# Patient Record
Sex: Female | Born: 1965
Health system: Southern US, Community
[De-identification: ages and names within clinical notes are randomized; demographics above are authoritative.]

## PROBLEM LIST (undated history)

## (undated) DIAGNOSIS — Z9289 Personal history of other medical treatment: Secondary | ICD-10-CM

## (undated) DIAGNOSIS — M1990 Unspecified osteoarthritis, unspecified site: Secondary | ICD-10-CM

## (undated) DIAGNOSIS — I480 Paroxysmal atrial fibrillation: Secondary | ICD-10-CM

## (undated) DIAGNOSIS — I5189 Other ill-defined heart diseases: Secondary | ICD-10-CM

## (undated) DIAGNOSIS — F419 Anxiety disorder, unspecified: Secondary | ICD-10-CM

## (undated) DIAGNOSIS — D573 Sickle-cell trait: Secondary | ICD-10-CM

## (undated) DIAGNOSIS — K5792 Diverticulitis of intestine, part unspecified, without perforation or abscess without bleeding: Secondary | ICD-10-CM

## (undated) DIAGNOSIS — G4730 Sleep apnea, unspecified: Secondary | ICD-10-CM

## (undated) DIAGNOSIS — I10 Essential (primary) hypertension: Secondary | ICD-10-CM

## (undated) DIAGNOSIS — Z8489 Family history of other specified conditions: Secondary | ICD-10-CM

## (undated) DIAGNOSIS — D649 Anemia, unspecified: Secondary | ICD-10-CM

## (undated) HISTORY — DX: Essential (primary) hypertension: I10

## (undated) HISTORY — DX: Other ill-defined heart diseases: I51.89

## (undated) HISTORY — DX: Anxiety disorder, unspecified: F41.9

## (undated) HISTORY — PX: TUBAL LIGATION: SHX77

## (undated) HISTORY — DX: Paroxysmal atrial fibrillation: I48.0

## (undated) HISTORY — DX: Personal history of other medical treatment: Z92.89

## (undated) HISTORY — DX: Anemia, unspecified: D64.9

## (undated) HISTORY — PX: OTHER SURGICAL HISTORY: SHX169

---

## 1999-01-21 ENCOUNTER — Emergency Department (HOSPITAL_COMMUNITY): Admission: EM | Admit: 1999-01-21 | Discharge: 1999-01-21 | Payer: Self-pay | Admitting: Emergency Medicine

## 2000-01-25 ENCOUNTER — Encounter: Payer: Self-pay | Admitting: Emergency Medicine

## 2000-01-25 ENCOUNTER — Emergency Department (HOSPITAL_COMMUNITY): Admission: EM | Admit: 2000-01-25 | Discharge: 2000-01-25 | Payer: Self-pay | Admitting: Emergency Medicine

## 2003-10-09 ENCOUNTER — Encounter: Admission: RE | Admit: 2003-10-09 | Discharge: 2003-10-09 | Payer: Self-pay | Admitting: Family Medicine

## 2004-10-03 ENCOUNTER — Encounter: Admission: RE | Admit: 2004-10-03 | Discharge: 2004-10-03 | Payer: Self-pay | Admitting: Family Medicine

## 2005-01-16 ENCOUNTER — Emergency Department (HOSPITAL_COMMUNITY): Admission: EM | Admit: 2005-01-16 | Discharge: 2005-01-16 | Payer: Self-pay | Admitting: Family Medicine

## 2005-07-22 ENCOUNTER — Emergency Department (HOSPITAL_COMMUNITY): Admission: EM | Admit: 2005-07-22 | Discharge: 2005-07-22 | Payer: Self-pay | Admitting: Emergency Medicine

## 2006-07-31 ENCOUNTER — Ambulatory Visit: Payer: Self-pay | Admitting: Sports Medicine

## 2006-09-05 ENCOUNTER — Telehealth (INDEPENDENT_AMBULATORY_CARE_PROVIDER_SITE_OTHER): Payer: Self-pay | Admitting: *Deleted

## 2006-09-05 ENCOUNTER — Ambulatory Visit: Payer: Self-pay | Admitting: Family Medicine

## 2006-10-24 ENCOUNTER — Encounter (INDEPENDENT_AMBULATORY_CARE_PROVIDER_SITE_OTHER): Payer: Self-pay | Admitting: Family Medicine

## 2006-10-25 ENCOUNTER — Encounter (INDEPENDENT_AMBULATORY_CARE_PROVIDER_SITE_OTHER): Payer: Self-pay | Admitting: *Deleted

## 2006-10-25 ENCOUNTER — Ambulatory Visit: Payer: Self-pay | Admitting: Family Medicine

## 2006-10-25 ENCOUNTER — Telehealth (INDEPENDENT_AMBULATORY_CARE_PROVIDER_SITE_OTHER): Payer: Self-pay | Admitting: *Deleted

## 2006-10-26 ENCOUNTER — Telehealth (INDEPENDENT_AMBULATORY_CARE_PROVIDER_SITE_OTHER): Payer: Self-pay | Admitting: *Deleted

## 2006-10-29 ENCOUNTER — Emergency Department (HOSPITAL_COMMUNITY): Admission: EM | Admit: 2006-10-29 | Discharge: 2006-10-29 | Payer: Self-pay | Admitting: Family Medicine

## 2006-10-29 ENCOUNTER — Encounter: Payer: Self-pay | Admitting: *Deleted

## 2006-10-30 ENCOUNTER — Telehealth: Payer: Self-pay | Admitting: *Deleted

## 2006-10-31 ENCOUNTER — Ambulatory Visit: Payer: Self-pay | Admitting: Family Medicine

## 2006-11-13 ENCOUNTER — Encounter (INDEPENDENT_AMBULATORY_CARE_PROVIDER_SITE_OTHER): Payer: Self-pay | Admitting: *Deleted

## 2007-01-01 ENCOUNTER — Telehealth (INDEPENDENT_AMBULATORY_CARE_PROVIDER_SITE_OTHER): Payer: Self-pay | Admitting: *Deleted

## 2007-01-29 ENCOUNTER — Ambulatory Visit: Payer: Self-pay | Admitting: Family Medicine

## 2007-02-14 HISTORY — PX: CHOLECYSTECTOMY: SHX55

## 2007-03-25 ENCOUNTER — Ambulatory Visit: Payer: Self-pay | Admitting: Sports Medicine

## 2007-03-25 ENCOUNTER — Encounter: Payer: Self-pay | Admitting: *Deleted

## 2007-03-25 ENCOUNTER — Telehealth: Payer: Self-pay | Admitting: *Deleted

## 2007-06-07 ENCOUNTER — Ambulatory Visit: Payer: Self-pay | Admitting: Family Medicine

## 2007-06-07 ENCOUNTER — Encounter (INDEPENDENT_AMBULATORY_CARE_PROVIDER_SITE_OTHER): Payer: Self-pay | Admitting: Family Medicine

## 2007-06-13 ENCOUNTER — Telehealth (INDEPENDENT_AMBULATORY_CARE_PROVIDER_SITE_OTHER): Payer: Self-pay | Admitting: *Deleted

## 2007-06-13 ENCOUNTER — Ambulatory Visit: Payer: Self-pay | Admitting: Family Medicine

## 2007-06-19 ENCOUNTER — Telehealth: Payer: Self-pay | Admitting: *Deleted

## 2007-06-20 ENCOUNTER — Ambulatory Visit: Payer: Self-pay | Admitting: Family Medicine

## 2007-06-20 ENCOUNTER — Encounter (INDEPENDENT_AMBULATORY_CARE_PROVIDER_SITE_OTHER): Payer: Self-pay | Admitting: *Deleted

## 2007-06-20 LAB — CONVERTED CEMR LAB
ALT: 14 units/L (ref 0–35)
AST: 18 units/L (ref 0–37)
Albumin: 4.5 g/dL (ref 3.5–5.2)
BUN: 10 mg/dL (ref 6–23)
Calcium: 9.5 mg/dL (ref 8.4–10.5)
Potassium: 4 meq/L (ref 3.5–5.3)
Sodium: 139 meq/L (ref 135–145)
Total Protein: 7.9 g/dL (ref 6.0–8.3)

## 2007-06-22 ENCOUNTER — Encounter (INDEPENDENT_AMBULATORY_CARE_PROVIDER_SITE_OTHER): Payer: Self-pay | Admitting: *Deleted

## 2007-06-28 ENCOUNTER — Telehealth: Payer: Self-pay | Admitting: *Deleted

## 2007-07-18 ENCOUNTER — Ambulatory Visit: Payer: Self-pay | Admitting: Family Medicine

## 2007-07-19 ENCOUNTER — Emergency Department (HOSPITAL_COMMUNITY): Admission: EM | Admit: 2007-07-19 | Discharge: 2007-07-19 | Payer: Self-pay | Admitting: Emergency Medicine

## 2007-07-19 ENCOUNTER — Telehealth: Payer: Self-pay | Admitting: Family Medicine

## 2007-07-22 ENCOUNTER — Telehealth: Payer: Self-pay | Admitting: *Deleted

## 2007-07-22 ENCOUNTER — Ambulatory Visit: Payer: Self-pay | Admitting: Family Medicine

## 2007-08-01 ENCOUNTER — Encounter: Payer: Self-pay | Admitting: Family Medicine

## 2007-08-02 ENCOUNTER — Ambulatory Visit: Payer: Self-pay | Admitting: Family Medicine

## 2007-08-02 ENCOUNTER — Encounter (INDEPENDENT_AMBULATORY_CARE_PROVIDER_SITE_OTHER): Payer: Self-pay | Admitting: Family Medicine

## 2007-08-02 LAB — CONVERTED CEMR LAB
Bilirubin Urine: NEGATIVE
Blood Glucose, Fingerstick: 179
Calcium: 9.5 mg/dL (ref 8.4–10.5)
Chloride: 100 meq/L (ref 96–112)
Glucose, Urine, Semiquant: NEGATIVE
Hemoglobin: 10.2 g/dL — ABNORMAL LOW (ref 12.0–15.0)
Ketones, urine, test strip: NEGATIVE
MCHC: 31.6 g/dL (ref 30.0–36.0)
Platelets: 528 10*3/uL — ABNORMAL HIGH (ref 150–400)
Potassium: 3.4 meq/L — ABNORMAL LOW (ref 3.5–5.3)
Protein, U semiquant: NEGATIVE
RDW: 17.3 % — ABNORMAL HIGH (ref 11.5–15.5)
Specific Gravity, Urine: 1.02
Urobilinogen, UA: 0.2
WBC Urine, dipstick: NEGATIVE

## 2007-08-05 ENCOUNTER — Telehealth: Payer: Self-pay | Admitting: *Deleted

## 2007-08-05 ENCOUNTER — Ambulatory Visit: Payer: Self-pay | Admitting: Family Medicine

## 2007-08-05 ENCOUNTER — Encounter (INDEPENDENT_AMBULATORY_CARE_PROVIDER_SITE_OTHER): Payer: Self-pay | Admitting: *Deleted

## 2007-08-05 LAB — CONVERTED CEMR LAB
Retic Ct Pct: 1.9 % (ref 0.4–3.1)
UIBC: 424 ug/dL

## 2007-08-12 ENCOUNTER — Encounter (INDEPENDENT_AMBULATORY_CARE_PROVIDER_SITE_OTHER): Payer: Self-pay | Admitting: Family Medicine

## 2007-08-12 ENCOUNTER — Ambulatory Visit: Payer: Self-pay | Admitting: Family Medicine

## 2007-08-22 ENCOUNTER — Encounter: Payer: Self-pay | Admitting: Family Medicine

## 2007-08-26 ENCOUNTER — Encounter: Payer: Self-pay | Admitting: Family Medicine

## 2007-08-26 ENCOUNTER — Ambulatory Visit: Payer: Self-pay | Admitting: Sports Medicine

## 2007-08-26 LAB — CONVERTED CEMR LAB
HCT: 33.8 % — ABNORMAL LOW (ref 36.0–46.0)
MCV: 74.6 fL — ABNORMAL LOW (ref 78.0–100.0)
WBC: 8.3 10*3/uL (ref 4.0–10.5)

## 2007-09-03 ENCOUNTER — Ambulatory Visit: Payer: Self-pay | Admitting: Family Medicine

## 2007-09-03 ENCOUNTER — Encounter (INDEPENDENT_AMBULATORY_CARE_PROVIDER_SITE_OTHER): Payer: Self-pay | Admitting: Family Medicine

## 2007-09-03 ENCOUNTER — Telehealth: Payer: Self-pay | Admitting: *Deleted

## 2007-09-11 ENCOUNTER — Telehealth: Payer: Self-pay | Admitting: *Deleted

## 2007-09-16 ENCOUNTER — Telehealth: Payer: Self-pay | Admitting: *Deleted

## 2007-09-17 ENCOUNTER — Encounter: Payer: Self-pay | Admitting: *Deleted

## 2007-09-23 ENCOUNTER — Telehealth: Payer: Self-pay | Admitting: Family Medicine

## 2007-09-25 ENCOUNTER — Encounter: Payer: Self-pay | Admitting: Family Medicine

## 2007-09-27 ENCOUNTER — Encounter: Payer: Self-pay | Admitting: Family Medicine

## 2007-09-30 ENCOUNTER — Telehealth: Payer: Self-pay | Admitting: Family Medicine

## 2007-10-02 ENCOUNTER — Ambulatory Visit: Payer: Self-pay | Admitting: Family Medicine

## 2007-10-14 ENCOUNTER — Encounter: Payer: Self-pay | Admitting: Family Medicine

## 2007-10-17 ENCOUNTER — Ambulatory Visit: Payer: Self-pay | Admitting: Family Medicine

## 2007-10-22 ENCOUNTER — Telehealth: Payer: Self-pay | Admitting: *Deleted

## 2007-10-31 ENCOUNTER — Telehealth: Payer: Self-pay | Admitting: *Deleted

## 2007-10-31 ENCOUNTER — Encounter: Payer: Self-pay | Admitting: Family Medicine

## 2007-11-07 ENCOUNTER — Encounter: Payer: Self-pay | Admitting: Family Medicine

## 2007-11-12 ENCOUNTER — Ambulatory Visit: Payer: Self-pay | Admitting: Family Medicine

## 2007-11-12 ENCOUNTER — Telehealth: Payer: Self-pay | Admitting: *Deleted

## 2007-11-13 ENCOUNTER — Encounter: Payer: Self-pay | Admitting: Family Medicine

## 2007-12-02 ENCOUNTER — Encounter (INDEPENDENT_AMBULATORY_CARE_PROVIDER_SITE_OTHER): Payer: Self-pay | Admitting: *Deleted

## 2007-12-03 ENCOUNTER — Telehealth (INDEPENDENT_AMBULATORY_CARE_PROVIDER_SITE_OTHER): Payer: Self-pay | Admitting: *Deleted

## 2007-12-03 ENCOUNTER — Ambulatory Visit: Payer: Self-pay | Admitting: Family Medicine

## 2007-12-06 ENCOUNTER — Encounter (INDEPENDENT_AMBULATORY_CARE_PROVIDER_SITE_OTHER): Payer: Self-pay | Admitting: *Deleted

## 2007-12-06 ENCOUNTER — Telehealth (INDEPENDENT_AMBULATORY_CARE_PROVIDER_SITE_OTHER): Payer: Self-pay | Admitting: *Deleted

## 2007-12-13 ENCOUNTER — Ambulatory Visit: Payer: Self-pay | Admitting: Family Medicine

## 2007-12-17 ENCOUNTER — Telehealth (INDEPENDENT_AMBULATORY_CARE_PROVIDER_SITE_OTHER): Payer: Self-pay | Admitting: *Deleted

## 2007-12-19 ENCOUNTER — Telehealth: Payer: Self-pay | Admitting: Family Medicine

## 2007-12-19 ENCOUNTER — Encounter: Payer: Self-pay | Admitting: Family Medicine

## 2007-12-23 ENCOUNTER — Encounter: Payer: Self-pay | Admitting: Family Medicine

## 2007-12-25 ENCOUNTER — Telehealth: Payer: Self-pay | Admitting: Family Medicine

## 2007-12-25 ENCOUNTER — Emergency Department (HOSPITAL_COMMUNITY): Admission: EM | Admit: 2007-12-25 | Discharge: 2007-12-25 | Payer: Self-pay | Admitting: Emergency Medicine

## 2007-12-25 ENCOUNTER — Encounter: Payer: Self-pay | Admitting: Family Medicine

## 2007-12-25 ENCOUNTER — Ambulatory Visit (HOSPITAL_COMMUNITY): Admission: RE | Admit: 2007-12-25 | Discharge: 2007-12-25 | Payer: Self-pay | Admitting: Gastroenterology

## 2007-12-27 ENCOUNTER — Telehealth: Payer: Self-pay | Admitting: Family Medicine

## 2007-12-30 ENCOUNTER — Encounter: Payer: Self-pay | Admitting: Family Medicine

## 2008-01-02 ENCOUNTER — Telehealth (INDEPENDENT_AMBULATORY_CARE_PROVIDER_SITE_OTHER): Payer: Self-pay | Admitting: *Deleted

## 2008-01-02 ENCOUNTER — Emergency Department (HOSPITAL_COMMUNITY): Admission: EM | Admit: 2008-01-02 | Discharge: 2008-01-02 | Payer: Self-pay | Admitting: Emergency Medicine

## 2008-01-03 ENCOUNTER — Encounter: Payer: Self-pay | Admitting: Family Medicine

## 2008-01-10 ENCOUNTER — Encounter (INDEPENDENT_AMBULATORY_CARE_PROVIDER_SITE_OTHER): Payer: Self-pay | Admitting: Surgery

## 2008-01-10 ENCOUNTER — Ambulatory Visit (HOSPITAL_COMMUNITY): Admission: RE | Admit: 2008-01-10 | Discharge: 2008-01-12 | Payer: Self-pay | Admitting: Surgery

## 2008-01-13 ENCOUNTER — Telehealth (INDEPENDENT_AMBULATORY_CARE_PROVIDER_SITE_OTHER): Payer: Self-pay | Admitting: *Deleted

## 2008-01-14 ENCOUNTER — Encounter: Payer: Self-pay | Admitting: Family Medicine

## 2008-01-16 ENCOUNTER — Encounter: Payer: Self-pay | Admitting: Family Medicine

## 2008-01-16 LAB — CONVERTED CEMR LAB
Alkaline Phosphatase: 58 units/L
BUN: 8 mg/dL
Creatinine, Ser: 0.83 mg/dL
Hemoglobin: 10.2 g/dL
Potassium: 4.2 meq/L
Sodium: 135 meq/L
Total Bilirubin: 0.4 mg/dL

## 2008-01-17 ENCOUNTER — Encounter: Payer: Self-pay | Admitting: Family Medicine

## 2008-01-23 ENCOUNTER — Telehealth: Payer: Self-pay | Admitting: *Deleted

## 2008-01-24 ENCOUNTER — Encounter: Payer: Self-pay | Admitting: *Deleted

## 2008-01-30 ENCOUNTER — Telehealth: Payer: Self-pay | Admitting: *Deleted

## 2008-03-11 ENCOUNTER — Encounter: Payer: Self-pay | Admitting: Family Medicine

## 2008-04-01 ENCOUNTER — Ambulatory Visit: Payer: Self-pay | Admitting: Family Medicine

## 2008-04-01 ENCOUNTER — Encounter: Payer: Self-pay | Admitting: Family Medicine

## 2008-09-09 ENCOUNTER — Inpatient Hospital Stay (HOSPITAL_COMMUNITY): Admission: AD | Admit: 2008-09-09 | Discharge: 2008-09-09 | Payer: Self-pay | Admitting: Obstetrics & Gynecology

## 2008-09-11 ENCOUNTER — Inpatient Hospital Stay (HOSPITAL_COMMUNITY): Admission: EM | Admit: 2008-09-11 | Discharge: 2008-09-12 | Payer: Self-pay | Admitting: Emergency Medicine

## 2008-10-24 ENCOUNTER — Encounter (INDEPENDENT_AMBULATORY_CARE_PROVIDER_SITE_OTHER): Payer: Self-pay | Admitting: *Deleted

## 2009-05-03 ENCOUNTER — Encounter: Payer: Self-pay | Admitting: Family Medicine

## 2009-05-03 ENCOUNTER — Telehealth: Payer: Self-pay | Admitting: Family Medicine

## 2009-05-27 ENCOUNTER — Ambulatory Visit: Payer: Self-pay | Admitting: Family Medicine

## 2009-06-07 ENCOUNTER — Encounter: Payer: Self-pay | Admitting: Family Medicine

## 2009-06-30 ENCOUNTER — Telehealth: Payer: Self-pay | Admitting: Family Medicine

## 2009-08-10 ENCOUNTER — Encounter: Payer: Self-pay | Admitting: Family Medicine

## 2009-08-18 ENCOUNTER — Encounter: Payer: Self-pay | Admitting: Family Medicine

## 2009-08-18 ENCOUNTER — Telehealth: Payer: Self-pay | Admitting: Family Medicine

## 2009-08-18 ENCOUNTER — Ambulatory Visit: Payer: Self-pay | Admitting: Family Medicine

## 2009-08-18 LAB — CONVERTED CEMR LAB
ALT: 11 units/L (ref 0–35)
Albumin: 3.8 g/dL (ref 3.5–5.2)
CO2: 18 meq/L — ABNORMAL LOW (ref 19–32)
Chloride: 108 meq/L (ref 96–112)
Cholesterol: 198 mg/dL (ref 0–200)
Creatinine, Ser: 0.78 mg/dL (ref 0.40–1.20)
Glucose, Bld: 92 mg/dL (ref 70–99)
HDL: 51 mg/dL (ref >39)
Hemoglobin: 9.9 g/dL — ABNORMAL LOW (ref 12.0–15.0)
LDL Cholesterol: 127 mg/dL — ABNORMAL HIGH (ref 0–99)
MCV: 73.2 fL — ABNORMAL LOW (ref 78.0–100.0)
Potassium: 4.1 meq/L (ref 3.5–5.3)
RDW: 19.3 % — ABNORMAL HIGH (ref 11.5–15.5)
Vit D, 25-Hydroxy: <10 ng/mL — ABNORMAL LOW (ref 30–89)

## 2009-08-19 ENCOUNTER — Telehealth: Payer: Self-pay | Admitting: Family Medicine

## 2009-12-17 ENCOUNTER — Emergency Department (HOSPITAL_COMMUNITY)
Admission: EM | Admit: 2009-12-17 | Discharge: 2009-12-17 | Payer: Self-pay | Source: Home / Self Care | Admitting: Family Medicine

## 2009-12-22 ENCOUNTER — Ambulatory Visit: Payer: Self-pay | Admitting: Family Medicine

## 2010-01-05 ENCOUNTER — Ambulatory Visit: Payer: Self-pay | Admitting: Family Medicine

## 2010-01-12 ENCOUNTER — Telehealth (INDEPENDENT_AMBULATORY_CARE_PROVIDER_SITE_OTHER): Payer: Self-pay | Admitting: Family Medicine

## 2010-01-25 ENCOUNTER — Encounter: Payer: Self-pay | Admitting: Cardiology

## 2010-01-25 ENCOUNTER — Ambulatory Visit: Payer: Self-pay | Admitting: Cardiology

## 2010-02-03 ENCOUNTER — Ambulatory Visit: Payer: Self-pay

## 2010-02-03 ENCOUNTER — Encounter: Payer: Self-pay | Admitting: Cardiology

## 2010-02-03 ENCOUNTER — Ambulatory Visit (HOSPITAL_COMMUNITY)
Admission: RE | Admit: 2010-02-03 | Discharge: 2010-02-03 | Payer: Self-pay | Source: Home / Self Care | Attending: Cardiology | Admitting: Cardiology

## 2010-02-18 ENCOUNTER — Ambulatory Visit: Admit: 2010-02-18 | Payer: Self-pay

## 2010-02-22 ENCOUNTER — Ambulatory Visit: Admit: 2010-02-22 | Payer: Self-pay

## 2010-03-07 ENCOUNTER — Ambulatory Visit
Admission: RE | Admit: 2010-03-07 | Discharge: 2010-03-07 | Payer: Self-pay | Source: Home / Self Care | Attending: Family Medicine | Admitting: Family Medicine

## 2010-03-14 ENCOUNTER — Telehealth: Payer: Self-pay | Admitting: Family Medicine

## 2010-03-17 NOTE — Progress Notes (Signed)
Summary: FMLA forms  Phone Note Call from Patient Call back at [phone]   Summary of Call: Needs to speak with someone about FMLA forms. Initial call taken by: Haydee Salter,  September 23, 2007 10:42 AM  Follow-up for Phone Call        spoke with patient earlier today and she is going to bring in more papers regarding FMLA that her employer states are incorrect. Follow-up by: Theresia Lo RN,  September 23, 2007 5:02 PM  Additional Follow-up for Phone Call Additional follow up Details #1::        pt is returning call to [facility] Additional Follow-up by: Knox Royalty,  September 24, 2007 4:23 PM    Additional Follow-up for Phone Call Additional follow up Details #2::    patient comes in stating her employer is requiring a plan of treatment for 06/27 thru 6/30 and plan of treatment for 08/02/07 thru 08/06/07. they are dening it otherwise. also she needs FMLA filled out for 07/11/ thru 7/18. patient saw Dr. Gavin Potters and the [facility] for these dates were never turned in. will place forms in MD box.  Will ask Dr.  Lafonda Mosses to  please give forms to Amy  today if you are able to fill out. Follow-up by: Theresia Lo RN,  September 25, 2007 9:24 AM  Additional Follow-up for Phone Call Additional follow up Details #3:: Details for Additional Follow-up Action Taken: Filled out new form for 7/11-18.  wrote treatment plan on old forms (last page).  Placed the forms back in my box.  Let me know what else I need to do.  Many thanks.   Additional Follow-up by: Asher Muir MD,  September 25, 2007 5:53 PM      Appended Document: FMLA forms forms faxed

## 2010-03-17 NOTE — Assessment & Plan Note (Signed)
Summary: ZO:XWRUEA megaly/lg   Primary Provider:  Helane Rima DO  CC:  referal from Dr. Earlene Plater for cardiomegaly.  History of Present Illness: 45 year old female for evaluation of cardiomegaly. This was noted on recent chest x-ray. Patient does have some dyspnea on exertion but she attributes this to tobacco use. She denies orthopnea, PND, pedal edema, chest pain, palpitations or syncope. Because of her cardiomegaly we are asked to further evaluate.  Current Medications (verified): 1)  Hydrochlorothiazide 25 Mg  Tabs (Hydrochlorothiazide) .... Take 1 Tab By Mouth Every Morning 2)  Claritin 10 Mg Tabs (Loratadine) .Marland Kitchen.. 1 Tab By Mouth Q Am 3)  Singulair 10 Mg Tabs (Montelukast Sodium) .Marland Kitchen.. 1 Tab  By Mouth Daily 4)  Xyzal 5 Mg Tabs (Levocetirizine Dihydrochloride) .Marland Kitchen.. 1 Tab By Mouth Q Pm 5)  Ventolin Hfa 108 (90 Base) Mcg/act Aers (Albuterol Sulfate) .... 2 Puffs Q 4 Hour As Needed Wheezing 6)  Flonase 50 Mcg/act Susp (Fluticasone Propionate) .... 2 Sprays in Each Nostril Daily 7)  Citalopram Hydrobromide 40 Mg Tabs (Citalopram Hydrobromide) .Marland Kitchen.. 1 Tab By Mouth Daily For Depression 8)  Gabapentin 100 Mg Caps (Gabapentin) .Marland Kitchen.. 1 Tab By Mouth Three Times A Day For Pain 9)  Cholestyramine 4 Gm Pack (Cholestyramine) .... Take 2-4 Grams Once Daily 10)  Ibuprofen 800 Mg Tabs (Ibuprofen) .... One Tablet Every 8 Hours For Pain 11)  Trazodone Hcl 50 Mg Tabs (Trazodone Hcl) .Marland Kitchen.. 1 By Mouth At Bedtime As Needed Insomnia  Allergies: No Known Drug Allergies  Past History:  Past Medical History: HYPERTENSION, BENIGN  SICKLE CELL TRAIT  BACK PAIN, LUMBAR, WITH RADICULOPATHY ANEMIA DEPRESSION, MAJOR GERD  ALLERGIC RHINITIS INSOMNIA  OBESITY NOS  Past Surgical History: lap cholecystectomy Incision drainage and open packing of perirectal abscess C-section x 3  Family History: Reviewed history from 07/31/2006 and no changes required. HTN Mother with enlarged heart  Social  History: Reviewed history from 04/01/2008 and no changes required. 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   Review of Systems       no fevers or chills, productive cough, hemoptysis, dysphasia, odynophagia, melena, hematochezia, dysuria, hematuria, rash, seizure activity, orthopnea, PND, pedal edema, claudication. Remaining systems are negative.   Vital Signs:  Patient profile:   45 year old female Height:      63 inches Weight:      230 pounds BMI:     40.89 Pulse rate:   96 / minute Resp:     14 per minute BP sitting:   130 / 90  (left arm)  Vitals Entered By: Kem Parkinson (January 25, 2010 2:34 PM)  Physical Exam  General:  Well-developed obese in no acute distress.  Skin is warm and dry.  HEENT is normal.  Neck is supple. No thyromegaly.  Chest is clear to auscultation with normal expansion.  Cardiovascular exam is regular rate and rhythm.  Abdominal exam nontender or distended. No masses palpated. Extremities show no edema. neuro grossly intact    EKG  Procedure date:  01/25/2010  Findings:      Sinus rhythm, occasional PAC, nonspecific ST changes, low voltage.  Impression & Recommendations:  Problem # 1:  CARDIOMEGALY (ICD-429.3) Patient has mild dyspnea on exertion but no other cardiac symptoms. Scheduled echocardiogram. LV size and function normal no further evaluation warranted. Orders: Echocardiogram (Echo)  Problem # 2:  HYPERTENSION, BENIGN (ICD-401.1) Blood pressure  mildly elevated. She will follow this at home and additional medications can be added as needed. I will leave this to primary care. Her updated medication list for this problem includes:    Hydrochlorothiazide 25 Mg Tabs (Hydrochlorothiazide) .Marland Kitchen... Take 1 tab by mouth every morning  Problem # 3:  TOBACCO USER (ICD-305.1) Patient counseled on  discontinuing for between 3-10 minutes.  Problem # 4:  SICKLE CELL TRAIT (ICD-282.5)  Patient Instructions: 1)  Your physician has requested that you have an echocardiogram.  Echocardiography is a painless test that uses sound waves to create images of your heart. It provides your doctor with information about the size and shape of your heart and how well your heart's chambers and valves are working.  This procedure takes approximately one hour. There are no restrictions for this procedure.   Vital Signs:  Patient profile:   45 year old female Height:      63 inches Weight:      230 pounds BMI:     40.89 Pulse rate:   96 / minute Resp:     14 per minute BP sitting:   130 / 90  (left arm)  Vitals Entered By: Kem Parkinson (January 25, 2010 2:34 PM)

## 2010-03-17 NOTE — Assessment & Plan Note (Signed)
Summary: rash/eo   Vital Signs:  Patient profile:   45 year old female Weight:      230 pounds Temp:     98.7 degrees F oral Pulse rate:   90 / minute BP sitting:   132 / 82  (right arm)  Vitals Entered By: Arlyss Repress CMA, (March 07, 2010 11:24 AM) CC: rash around vagina and groin area x 3 weeks. very itchy. Is Patient Diabetic? No Pain Assessment Patient in pain? no        Primary Care Provider:  Helane Rima DO  CC:  rash around vagina and groin area x 3 weeks. very itchy..  History of Present Illness: 45 yo F:  1. Rash: Under pannus and inner thighs, x several days, worsening, raw, itchy. No vaginal DC or itch. No open lesions. Has never had this before. No Hx DM.  Habits & Providers  Alcohol-Tobacco-Diet     Tobacco Status: never  Current Medications (verified): 1)  Hydrochlorothiazide 25 Mg  Tabs (Hydrochlorothiazide) .... Take 1 Tab By Mouth Every Morning 2)  Claritin 10 Mg Tabs (Loratadine) .Marland Kitchen.. 1 Tab By Mouth Q Am 3)  Singulair 10 Mg Tabs (Montelukast Sodium) .Marland Kitchen.. 1 Tab  By Mouth Daily 4)  Xyzal 5 Mg Tabs (Levocetirizine Dihydrochloride) .Marland Kitchen.. 1 Tab By Mouth Q Pm 5)  Ventolin Hfa 108 (90 Base) Mcg/act Aers (Albuterol Sulfate) .... 2 Puffs Q 4 Hour As Needed Wheezing 6)  Flonase 50 Mcg/act Susp (Fluticasone Propionate) .... 2 Sprays in Each Nostril Daily 7)  Citalopram Hydrobromide 40 Mg Tabs (Citalopram Hydrobromide) .Marland Kitchen.. 1 Tab By Mouth Daily For Depression 8)  Gabapentin 100 Mg Caps (Gabapentin) .Marland Kitchen.. 1 Tab By Mouth Three Times A Day For Pain 9)  Cholestyramine 4 Gm Pack (Cholestyramine) .... Take 2-4 Grams Once Daily 10)  Ibuprofen 800 Mg Tabs (Ibuprofen) .... One Tablet Every 8 Hours For Pain 11)  Trazodone Hcl 50 Mg Tabs (Trazodone Hcl) .Marland Kitchen.. 1 By Mouth At Bedtime As Needed Insomnia 12)  Diflucan 150 Mg Tabs (Fluconazole) .... Once Daily X 1, Then Again in 1 Week If Not Better 13)  Nystatin 100000 Unit/gm Crea (Nystatin) .... Apply To Intertigo Two Times  A Day As Needed For Rash  Allergies (verified): No Known Drug Allergies PMH-FH-SH reviewed for relevance  Social History: Smoking Status:  never  Review of Systems General:  Denies chills and fever. GI:  Denies abdominal pain, constipation, diarrhea, nausea, and vomiting. GU:  Denies discharge, dysuria, and genital sores. Derm:  Complains of rash.  Physical Exam  General:  Well-developed, well-nourished, in no acute distress; alert, appropriate and cooperative throughout examination. Vitals reviewed. Skin:  Intertrigo.   Impression & Recommendations:  Problem # 1:  INTERTRIGO, CANDIDAL (ZOX-096.04) Assessment New  Rx Diflucan, Nystatin. Discussed Dx, Tx, and future prevention.  Orders: [facility]- Est Level  3 (54098)  Complete Medication List: 1)  Hydrochlorothiazide 25 Mg Tabs (Hydrochlorothiazide) .... Take 1 tab by mouth every morning 2)  Claritin 10 Mg Tabs (Loratadine) .Marland Kitchen.. 1 tab by mouth q am 3)  Singulair 10 Mg Tabs (Montelukast sodium) .Marland Kitchen.. 1 tab  by mouth daily 4)  Xyzal 5 Mg Tabs (Levocetirizine dihydrochloride) .Marland Kitchen.. 1 tab by mouth q pm 5)  Ventolin Hfa 108 (90 Base) Mcg/act Aers (Albuterol sulfate) .... 2 puffs q 4 hour as needed wheezing 6)  Flonase 50 Mcg/act Susp (Fluticasone propionate) .... 2 sprays in each nostril daily 7)  Citalopram Hydrobromide 40 Mg Tabs (Citalopram hydrobromide) .Marland Kitchen.. 1 tab by  mouth daily for depression 8)  Gabapentin 100 Mg Caps (Gabapentin) .Marland Kitchen.. 1 tab by mouth three times a day for pain 9)  Cholestyramine 4 Gm Pack (Cholestyramine) .... Take 2-4 grams once daily 10)  Ibuprofen 800 Mg Tabs (Ibuprofen) .... One tablet every 8 hours for pain 11)  Trazodone Hcl 50 Mg Tabs (Trazodone hcl) .Marland Kitchen.. 1 by mouth at bedtime as needed insomnia 12)  Diflucan 150 Mg Tabs (Fluconazole) .... Once daily x 1, then again in 1 week if not better 13)  Nystatin 100000 Unit/gm Crea (Nystatin) .... Apply to intertigo two times a day as needed for rash  Patient  Instructions: 1)  It was nice to see you today! 2)  You have a yeast infection. Prescriptions: NYSTATIN 100000 UNIT/GM CREA (NYSTATIN) apply to intertigo two times a day as needed for rash  #15 x 0   Entered and Authorized by:   Helane Rima DO   Signed by:   Helane Rima DO on 03/07/2010   Method used:   Print then Give to Patient   RxID:   [id number] DIFLUCAN 150 MG TABS (FLUCONAZOLE) once daily x 1, then again in 1 week if not better  #2 x 0   Entered and Authorized by:   Helane Rima DO   Signed by:   Helane Rima DO on 03/07/2010   Method used:   Print then Give to Patient   RxID:   [id number]    Orders Added: 1)  [facility]- Est Level  3 [[address]]

## 2010-03-17 NOTE — Letter (Signed)
Summary: Generic Letter  [facility]  [address]   [place], [place] [address]   Phone: [phone]  Fax: [phone]    06/07/2009  TAWYNA PELLOT [address] [place], [place]  [address]  Dear [name],   You missed your lab appointment last week.  It is very important that we check your labs at least once a year.  Please reschedule your appointment.   Please call me if you have any questions or concerns.           Sincerely,   Asher Muir MD

## 2010-03-17 NOTE — Progress Notes (Signed)
Summary: TRIAGE  Phone Note Refill Request Call back at Home Phone [phone] Message from:  Patient  Refills Requested: Medication #1:  HYDROCHLOROTHIAZIDE 25 MG  TABS Take 1 tab by mouth every morning PT IS AT [facility] WAITING FOR HER MEDS AND DR BRISCOE WAS TO SEND THIS ONE IN ALSO.  CAN THIS BE DONE RIGHT AWAY.  Initial call taken by: Clydell Hakim,  August 18, 2009 1:34 PM Caller: Patient  Follow-up for Phone Call        sent text to md Follow-up by: Golden Circle RN,  August 18, 2009 1:53 PM  Additional Follow-up for Phone Call Additional follow up Details #1::        md states she is waiting for labs before she refills it. called to to let her know this.  had to LM asking her to call me Additional Follow-up by: Golden Circle RN,  August 18, 2009 2:56 PM    Additional Follow-up for Phone Call Additional follow up Details #2::    Left message wanting to speak to patient about her labwork- I would like to speak with her. Prescriptions have been called in. Follow-up by: Delbert Harness MD,  August 19, 2009 8:35 AM  Additional Follow-up for Phone Call Additional follow up Details #3:: Details for Additional Follow-up Action Taken: faxed rx for ambien to [facility] [address] Additional Follow-up by: Golden Circle RN,  August 19, 2009 9:57 AM

## 2010-03-17 NOTE — Progress Notes (Signed)
Summary: Rx Req  Phone Note Refill Request Call back at Home Phone [phone] Message from:  Patient  Refills Requested: Medication #1:  HYDROCHLOROTHIAZIDE 25 MG  TABS Take 1 tab by mouth every morning PT USES [facility] [address]  Initial call taken by: Clydell Hakim,  May 03, 2009 2:20 PM  Follow-up for Phone Call        I refilled for one month.  She has not been seen in hte office for over one year.  She will need to come in for further refills Follow-up by: Asher Muir MD,  May 03, 2009 10:20 PM  Additional Follow-up for Phone Call Additional follow up Details #1::        Pt notified concerning rx and to make appt.  Pt made appt. for 05/24/09. Additional Follow-up by: Clydell Hakim,  May 04, 2009 4:36 PM    Prescriptions: HYDROCHLOROTHIAZIDE 25 MG  TABS (HYDROCHLOROTHIAZIDE) Take 1 tab by mouth every morning  #31 Tablet x 0   Entered and Authorized by:   Asher Muir MD   Signed by:   Asher Muir MD on 05/03/2009   Method used:   Electronically to        [facility] [phone]* (retail)       [address]       [place], [place]  [address]       Ph: [phone]       Fax: [phone]   RxID:   [id number]

## 2010-03-17 NOTE — Progress Notes (Signed)
Summary: re: appt with Cardiology/TS  Phone Note Outgoing Call   Call placed by: Jimmy Footman, CMA,  January 12, 2010 4:58 PM Call placed to: Patient Summary of Call: LVM for pt. to call back. Cardiology referral info is on order  Follow-up for Phone Call        called pt and lmvm again to call us back. re: appt with Cardiologist. Follow-up by: Arlyss Repress CMA,,  January 13, 2010 11:42 AM  Additional Follow-up for Phone Call Additional follow up Details #1::        pt informed Additional Follow-up by: De Nurse,  January 13, 2010 3:07 PM
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
## 2010-03-17 NOTE — Miscellaneous (Signed)
Summary: do not refill meds until she makes lab appt  Clinical Lists Changes  pt needs labs prior to further refills of her hctz

## 2010-03-17 NOTE — Letter (Signed)
Summary: Generic Letter  [facility]  [address]   [place], [place] [address]   Phone: [phone]  Fax: [phone]    05/03/2009  SAMHITHA ROSEN [address] [place], [place]  [address]  Dear [name],   You have not been seen in our office in some time.  Please call [phone] and schedule an office visit.  I look forward to seeing you.         Sincerely,   Asher Muir MD  Appended Document: Generic Letter pt has appt on 4/11- did not mail

## 2010-03-17 NOTE — Progress Notes (Signed)
Summary: refill  Phone Note Refill Request Call back at Home Phone [phone] Message from:  Patient  Refills Requested: Medication #1:  HYDROCHLOROTHIAZIDE 25 MG  TABS Take 1 tab by mouth every morning pt is now out  Initial call taken by: De Nurse,  Jun 30, 2009 12:30 PM Caller: Patient  Follow-up for Phone Call        she needs to come in for labs.  I will refill one more month, but she must get her labs drawn for any further refills.  Also, will you see which pharmacy she would like it sent to?  thanks Follow-up by: Asher Muir MD,  Jun 30, 2009 2:21 PM  Additional Follow-up for Phone Call Additional follow up Details #1::        pt notified, voiced understanding, [facility] [address],told to check with pharm later this afternoon , to PCP Additional Follow-up by: Gladstone Pih,  Jun 30, 2009 2:28 PM    Prescriptions: HYDROCHLOROTHIAZIDE 25 MG  TABS (HYDROCHLOROTHIAZIDE) Take 1 tab by mouth every morning  #31 Tablet x 0   Entered and Authorized by:   Asher Muir MD   Signed by:   Asher Muir MD on 06/30/2009   Method used:   Electronically to        [facility] [phone]* (retail)       [address]       [place], [place]  [address]       Ph: [phone]       Fax: [phone]   RxID:   [id number]

## 2010-03-17 NOTE — Assessment & Plan Note (Signed)
Summary: rectal pain/labs,df   Vital Signs:  Patient profile:   45 year old female Weight:      232.3 pounds BMI:     41.30 Temp:     98.3 degrees F oral Pulse rate:   82 / minute BP sitting:   129 / 86  (left arm) Cuff size:   large  Vitals Entered By: Jimmy Footman, CMA (August 18, 2009 11:22 AM) CC: rectal discharge and pain. Yeast infection? Is Patient Diabetic? No Pain Assessment Patient in pain? no        Primary Care Provider:  Delbert Harness MD  CC:  rectal discharge and pain. Yeast infection?.  History of Present Illness: 45 yo here to discuss rectal pain/discharge and yeast infection  2 month history of rectal discomfort and anal leakage.  Patient has a history of rectal abscess which was drained last year.  Had completely resolved then 2 months ago started having anal leakage.  Has chronic loose stools s/p cholecystectomy which is unchanged.  Anal leakage has started to cause soreness and irritation in rectal and groin area.  No painful hard stools.  Has started to have rectal spotting due to skin irritation but denies hemorrhoids.  no anal incontinence.   Habits & Providers  Alcohol-Tobacco-Diet     Tobacco Status: current  Current Medications (verified): 1)  Hydrochlorothiazide 25 Mg  Tabs (Hydrochlorothiazide) .... Take 1 Tab By Mouth Every Morning 2)  Ambien 5 Mg  Tabs (Zolpidem Tartrate) .... Take 1-2 Tabs By Mouth At Bedtime As Needed Insomnia 3)  Claritin 10 Mg Tabs (Loratadine) .Marland Kitchen.. 1 Tab By Mouth Q Am 4)  Singulair 10 Mg Tabs (Montelukast Sodium) .Marland Kitchen.. 1 Tab  By Mouth Daily 5)  Xyzal 5 Mg Tabs (Levocetirizine Dihydrochloride) .Marland Kitchen.. 1 Tab By Mouth Q Pm 6)  Ventolin Hfa 108 (90 Base) Mcg/act Aers (Albuterol Sulfate) .... 2 Puffs Q 4 Hour As Needed Wheezing 7)  Flonase 50 Mcg/act Susp (Fluticasone Propionate) .... 2 Sprays in Each Nostril Daily 8)  Citalopram Hydrobromide 40 Mg Tabs (Citalopram Hydrobromide) .... 1/2 Tab By Mouth Daily For 5 Days.  Then 1 Tab By  Mouth Daily For Depression 9)  Gabapentin 100 Mg Caps (Gabapentin) .Marland Kitchen.. 1 Tab By Mouth Three Times A Day For Pain 10)  Fluconazole 150 Mg Tabs (Fluconazole) .... Take One Tablet For Yeast Infection.  May Repeat in 3 Days 11)  Cholestyramine 4 Gm Pack (Cholestyramine) .... Take 2-4 Grams Once Daily  Allergies: No Known Drug Allergies PMH-FH-SH reviewed-no changes except otherwise noted  Review of Systems      See HPI General:  Denies chills, fever, and weakness. CV:  Denies chest pain or discomfort and lightheadness. Resp:  Denies cough. GI:  Complains of bloody stools and diarrhea; denies abdominal pain, change in bowel habits, constipation, nausea, and vomiting. GU:  Denies abnormal vaginal bleeding, discharge, and dysuria.  Physical Exam  General:  obese.  NAD Abdomen:  Obese, + BS, no TTP Rectal:  good sphincter tone.  Attempted anoscopy- patient could not tolerate due to spasm.  Some loose stool noted around rectum.  Old skin tag noted externally.  Irritated skin noted around rectum.  No bleeding noted.    Intertrigo present   Impression & Recommendations:  Problem # 1:  RECTAL PAIN (ICD-569.42)  No evidence of abscess or sphincter damage, or fistula.  Also no hemorrhoids.  Suspect pelvic floor weakness which is now painful due to yeast infection from moisture.  Will prescribe fluconazole,  given instructions on Kegel exercises.  Also  given info on high fiber diet. Follow-up i n1 month.  Orders: [facility]- Est Level  3 (78295)  Problem # 2:  CHOLECYSTECTOMY, HX OF (ICD-V45.79)  Prescribed cholestyramine to reduce diarrhea since cholecystectomy and reduce pain constant moisture.  Orders: [facility]- Est Level  3 (62130)  Problem # 3:  ANEMIA (ICD-285.9) Chronic anemia- last evaluated 2 years ago june 2009- irone deficiency.  Will start iron tabs today and discuss further with patient at next visit.  Will work to get her up to date on cervical ca screening.  hx of duodenitis, heavy  periods, and now rectal bleeding.  Needs hemoccult once superficial skin irritation resolved.  Her updated medication list for this problem includes:    Ferrous Sulfate 325 (65 Fe) Mg Tabs (Ferrous sulfate) .Marland Kitchen... Take one tablet twice a day  Orders: [facility] (86578)  Complete Medication List: 1)  Hydrochlorothiazide 25 Mg Tabs (Hydrochlorothiazide) .... Take 1 tab by mouth every morning 2)  Ambien 5 Mg Tabs (Zolpidem tartrate) .... Take 1-2 tabs by mouth at bedtime as needed insomnia 3)  Claritin 10 Mg Tabs (Loratadine) .Marland Kitchen.. 1 tab by mouth q am 4)  Singulair 10 Mg Tabs (Montelukast sodium) .Marland Kitchen.. 1 tab  by mouth daily 5)  Xyzal 5 Mg Tabs (Levocetirizine dihydrochloride) .Marland Kitchen.. 1 tab by mouth q pm 6)  Ventolin Hfa 108 (90 Base) Mcg/act Aers (Albuterol sulfate) .... 2 puffs q 4 hour as needed wheezing 7)  Flonase 50 Mcg/act Susp (Fluticasone propionate) .... 2 sprays in each nostril daily 8)  Citalopram Hydrobromide 40 Mg Tabs (Citalopram hydrobromide) .Marland Kitchen.. 1 tab by mouth daily for depression 9)  Gabapentin 100 Mg Caps (Gabapentin) .Marland Kitchen.. 1 tab by mouth three times a day for pain 10)  Fluconazole 150 Mg Tabs (Fluconazole) .... Take one tablet for yeast infection.  may repeat in 3 days 11)  Cholestyramine 4 Gm Pack (Cholestyramine) .... Take 2-4 grams once daily 12)  Ergocalciferol 50000 Unit Caps (Ergocalciferol) .... Take one tablet weekly for 8 weeks 13)  Ferrous Sulfate 325 (65 Fe) Mg Tabs (Ferrous sulfate) .... Take one tablet twice a day  Other Orders: Comp Met-[facility] [id number]) Lipid-[facility] [id number]) Vit D, 25 [facility] ([id number])  Patient Instructions: 1)  Kegel exercies to strengthen muscles 2)  diflucan for yeast 3)  Choletyramine for diarrhea. 4)  return if no improvement Prescriptions: FERROUS SULFATE 325 (65 FE) MG TABS (FERROUS SULFATE) take one tablet twice a day  #60 x 5   Entered and Authorized by:   Delbert Harness MD   Signed by:   Delbert Harness MD on 08/19/2009   Method  used:   Electronically to        [facility] [phone]* (retail)       [address]       [place], [place]  [address]       Ph: [phone]       Fax: [phone]   RxID:   [id number] AMBIEN 5 MG  TABS (ZOLPIDEM TARTRATE) take 1-2 tabs by mouth at bedtime as needed insomnia  #28 x 0   Entered and Authorized by:   Delbert Harness MD   Signed by:   Delbert Harness MD on 08/19/2009   Method used:   Print then Give to Patient   RxID:   [id number] GABAPENTIN 100 MG CAPS (GABAPENTIN) 1 tab by mouth three times a day for pain  #90 x 5   Entered and Authorized by:  Delbert Harness MD   Signed by:   Delbert Harness MD on 08/19/2009   Method used:   Electronically to        [facility] [phone]* (retail)       [address]       [place], [place]  [address]       Ph: [phone]       Fax: [phone]   RxID:   [id number] HYDROCHLOROTHIAZIDE 25 MG  TABS (HYDROCHLOROTHIAZIDE) Take 1 tab by mouth every morning  #30 Tablet x 5   Entered and Authorized by:   Delbert Harness MD   Signed by:   Delbert Harness MD on 08/19/2009   Method used:   Electronically to        [facility] [phone]* (retail)       [address]       [place], [place]  [address]       Ph: [phone]       Fax: [phone]   RxID:   [id number] ERGOCALCIFEROL 50000 UNIT CAPS (ERGOCALCIFEROL) take one tablet weekly for 8 weeks  #8 x 0   Entered and Authorized by:   Delbert Harness MD   Signed by:   Delbert Harness MD on 08/19/2009   Method used:   Electronically to        [facility] [phone]* (retail)       [address]       [place], [place]  [address]       Ph: [phone]       Fax: [phone]   RxID:   [id number] CHOLESTYRAMINE 4 GM PACK (CHOLESTYRAMINE) take 2-4 grams once daily  #30 x 2   Entered and Authorized by:   Delbert Harness MD   Signed by:   Delbert Harness MD on 08/18/2009   Method used:   Electronically to        [facility] [phone]* (retail)       [address]        [place], [place]  [address]       Ph: [phone]       Fax: [phone]   RxID:   [id number] FLUCONAZOLE 150 MG TABS (FLUCONAZOLE) take one tablet for yeast infection.  may repeat in 3 days  #2 x 1   Entered and Authorized by:   Delbert Harness MD   Signed by:   Delbert Harness MD on 08/18/2009   Method used:   Electronically to        [facility] [phone]* (retail)       [address]       [place], [place]  [address]       Ph: [phone]       Fax: [phone]   RxID:   [id number]

## 2010-03-17 NOTE — Assessment & Plan Note (Signed)
Summary: enlarged heart,df   Vital Signs:  Patient profile:   45 year old female Height:      63 inches Weight:      231 pounds BMI:     41.07 Temp:     98.5 degrees F oral Pulse rate:   100 / minute BP sitting:   140 / 82  (left arm) Cuff size:   large  Vitals Entered By: Tessie Fass CMA (January 05, 2010 10:02 AM) CC: cardiomegaly, insomnia, back pain Is Patient Diabetic? No Pain Assessment Patient in pain? yes     Location: upper back and neck Intensity: 6   Primary Care Provider:  Helane Rima DO  CC:  cardiomegaly, insomnia, and back pain.  History of Present Illness: 45 yo F, whose family is well-known to me, presents wanting to discuss several issues and to switch care to me. We limited today's discussion to 3 issues.  1. Cardiomegaly: on CXR. Patient concerned about this and requests cardiology evaluation. Denies CP, SOB, LE edema.  2. Back Pain: Chronic. Rx Ibuprofen 800 mg by mouth daily. No radiation. No bowel/bladder incontinence. No numbness/tingling/weakness.  3. Insomnia: "I feel sleep-deprived." "I used to be on Ambien but the last doctor would not prescribe it." She has difficulty falling asleep. Denies limb movement, sleep apnea s/s.  Habits & Providers  Alcohol-Tobacco-Diet     Tobacco Status: quit  Current Medications (verified): 1)  Hydrochlorothiazide 25 Mg  Tabs (Hydrochlorothiazide) .... Take 1 Tab By Mouth Every Morning 2)  Claritin 10 Mg Tabs (Loratadine) .Marland Kitchen.. 1 Tab By Mouth Q Am 3)  Singulair 10 Mg Tabs (Montelukast Sodium) .Marland Kitchen.. 1 Tab  By Mouth Daily 4)  Xyzal 5 Mg Tabs (Levocetirizine Dihydrochloride) .Marland Kitchen.. 1 Tab By Mouth Q Pm 5)  Ventolin Hfa 108 (90 Base) Mcg/act Aers (Albuterol Sulfate) .... 2 Puffs Q 4 Hour As Needed Wheezing 6)  Flonase 50 Mcg/act Susp (Fluticasone Propionate) .... 2 Sprays in Each Nostril Daily 7)  Citalopram Hydrobromide 40 Mg Tabs (Citalopram Hydrobromide) .Marland Kitchen.. 1 Tab By Mouth Daily For Depression 8)  Gabapentin  100 Mg Caps (Gabapentin) .Marland Kitchen.. 1 Tab By Mouth Three Times A Day For Pain 9)  Cholestyramine 4 Gm Pack (Cholestyramine) .... Take 2-4 Grams Once Daily 10)  Ergocalciferol 50000 Unit Caps (Ergocalciferol) .... Take One Tablet Weekly For 8 Weeks 11)  Ferrous Sulfate 325 (65 Fe) Mg Tabs (Ferrous Sulfate) .... Take One Tablet Twice A Day 12)  Ibuprofen 800 Mg Tabs (Ibuprofen) .... One Tablet Every 8 Hours For Pain 13)  Trazodone Hcl 50 Mg Tabs (Trazodone Hcl) .Marland Kitchen.. 1 By Mouth At Bedtime As Needed Insomnia  Allergies (verified): No Known Drug Allergies PMH-FH-SH reviewed for relevance  Social History: Smoking Status:  quit  Review of Systems General:  Denies chills and fever. CV:  Denies chest pain or discomfort, shortness of breath with exertion, and swelling of feet. Resp:  Denies cough. MS:  Complains of low back pain.  Physical Exam  General:  Well-developed, well-nourished, in no acute distress; alert, appropriate and cooperative throughout examination. Vitals reviewed. Lungs:  Normal respiratory effort, chest expands symmetrically. Lungs are clear to auscultation, no crackles or wheezes. Heart:  Normal rate and regular rhythm. S1 and S2 normal without gallop, murmur, click, rub or other extra sounds. Pulses:  R and L dorsalis pedis and posterior tibial pulses are full and equal bilaterally. Extremities:  No edema.   Impression & Recommendations:  Problem # 1:  CARDIOMEGALY (ICD-429.3) Assessment Unchanged On CXR.  Will send to cardiology for further evaluation per patient request. Likely 2/2 HTN, which is still not well-controlled.  Orders: Cardiology Referral (Cardiology) [facility]- Est  Level 4 (40981)  NOTE: Patient new to me. Advised her to make next appointment for a FULL PHYSICAL and to present for labs one week prior. Will adjust medication regimen at that time.  Problem # 2:  BACK PAIN, LUMBAR, WITH RADICULOPATHY (ICD-724.4) Assessment: Unchanged  Chronic. No red flags. Rx  Ibuprofen. Advised patient that this could increase BP. Her updated medication list for this problem includes:    Ibuprofen 800 Mg Tabs (Ibuprofen) ..... One tablet every 8 hours for pain  Orders: [facility]- Est  Level 4 (19147)  Problem # 3:  INSOMNIA (ICD-780.52) Assessment: Unchanged  Rx Trazodone trial.  Orders: [facility]- Est  Level 4 (82956)  Complete Medication List: 1)  Hydrochlorothiazide 25 Mg Tabs (Hydrochlorothiazide) .... Take 1 tab by mouth every morning 2)  Claritin 10 Mg Tabs (Loratadine) .Marland Kitchen.. 1 tab by mouth q am 3)  Singulair 10 Mg Tabs (Montelukast sodium) .Marland Kitchen.. 1 tab  by mouth daily 4)  Xyzal 5 Mg Tabs (Levocetirizine dihydrochloride) .Marland Kitchen.. 1 tab by mouth q pm 5)  Ventolin Hfa 108 (90 Base) Mcg/act Aers (Albuterol sulfate) .... 2 puffs q 4 hour as needed wheezing 6)  Flonase 50 Mcg/act Susp (Fluticasone propionate) .... 2 sprays in each nostril daily 7)  Citalopram Hydrobromide 40 Mg Tabs (Citalopram hydrobromide) .Marland Kitchen.. 1 tab by mouth daily for depression 8)  Gabapentin 100 Mg Caps (Gabapentin) .Marland Kitchen.. 1 tab by mouth three times a day for pain 9)  Cholestyramine 4 Gm Pack (Cholestyramine) .... Take 2-4 grams once daily 10)  Ergocalciferol 50000 Unit Caps (Ergocalciferol) .... Take one tablet weekly for 8 weeks 11)  Ferrous Sulfate 325 (65 Fe) Mg Tabs (Ferrous sulfate) .... Take one tablet twice a day 12)  Ibuprofen 800 Mg Tabs (Ibuprofen) .... One tablet every 8 hours for pain 13)  Trazodone Hcl 50 Mg Tabs (Trazodone hcl) .Marland Kitchen.. 1 by mouth at bedtime as needed insomnia  Other Orders: Future Orders: CBC-[facility] (21308) ... 12/30/2010 TSH-[facility] [id number]) ... 12/23/2010  Patient Instructions: 1)  It was nice to meet you today! 2)  Make you next appointment for a full physical. 3)  Come for labs one week prior. Prescriptions: IBUPROFEN 800 MG TABS (IBUPROFEN) one tablet every 8 hours for pain  #30 x 3   Entered and Authorized by:   Helane Rima DO   Signed by:   Helane Rima DO  on 01/05/2010   Method used:   Print then Give to Patient   RxID:   [id number] GABAPENTIN 100 MG CAPS (GABAPENTIN) 1 tab by mouth three times a day for pain  #90 x 5   Entered and Authorized by:   Helane Rima DO   Signed by:   Helane Rima DO on 01/05/2010   Method used:   Print then Give to Patient   RxID:   [id number] CITALOPRAM HYDROBROMIDE 40 MG TABS (CITALOPRAM HYDROBROMIDE) 1 tab by mouth daily for depression  #30 x 1   Entered and Authorized by:   Helane Rima DO   Signed by:   Helane Rima DO on 01/05/2010   Method used:   Print then Give to Patient   RxID:   [id number] VENTOLIN HFA 108 (90 BASE) MCG/ACT AERS (ALBUTEROL SULFATE) 2 puffs q 4 hour as needed wheezing  #1 x 4   Entered and Authorized by:   Helane Rima DO  Signed by:   Helane Rima DO on 01/05/2010   Method used:   Print then Give to Patient   RxID:   [id number] HYDROCHLOROTHIAZIDE 25 MG  TABS (HYDROCHLOROTHIAZIDE) Take 1 tab by mouth every morning  #30 Tablet x 5   Entered and Authorized by:   Helane Rima DO   Signed by:   Helane Rima DO on 01/05/2010   Method used:   Print then Give to Patient   RxID:   [id number] TRAZODONE HCL 50 MG TABS (TRAZODONE HCL) 1 by mouth at bedtime as needed insomnia  #30 x 1   Entered and Authorized by:   Helane Rima DO   Signed by:   Helane Rima DO on 01/05/2010   Method used:   Print then Give to Patient   RxID:   [id number]    Orders Added: 1)  Cardiology Referral [Cardiology] 2)  [facility]- Est  Level 4 [99214] 3)  CBC-[facility] [85027] 4)  TSH-[facility] [[id number]]    Prevention & Chronic Care Immunizations   Influenza vaccine: Not documented    Tetanus booster: Not documented    Pneumococcal vaccine: Not documented  Other Screening   Pap smear: Not documented    Mammogram: Not documented   Smoking status: quit  (01/05/2010)  Lipids   Total Cholesterol: 198  (08/18/2009)   LDL: 127  (08/18/2009)   LDL Direct: Not  documented   HDL: 51  (08/18/2009)   Triglycerides: 102  (08/18/2009)  Hypertension   Last Blood Pressure: 140 / 82  (01/05/2010)   Serum creatinine: 0.78  (08/18/2009)   Serum potassium 4.1  (08/18/2009)    Hypertension flowsheet reviewed?: Yes   Progress toward BP goal: Unchanged  Self-Management Support :   Personal Goals (by the next clinic visit) :      Personal blood pressure goal: 140/90  (01/05/2010)   Patient will work on the following items until the next clinic visit to reach self-care goals:     Medications and monitoring: take my medicines every day, bring all of my medications to every visit  (01/05/2010)     Eating: drink diet soda or water instead of juice or soda, eat more vegetables, use fresh or frozen vegetables, eat foods that are low in salt, eat baked foods instead of fried foods, eat fruit for snacks and desserts, limit or avoid alcohol  (01/05/2010)     Activity: take a 30 minute walk every day, take the stairs instead of the elevator, park at the far end of the parking lot  (01/05/2010)    Hypertension self-management support: Written self-care plan  (01/05/2010)   Hypertension self-care plan printed.

## 2010-03-17 NOTE — Assessment & Plan Note (Signed)
Summary: f/u urgent care,df   Vital Signs:  Patient profile:   45 year old female Height:      63 inches Weight:      230.50 pounds Temp:     98.3 degrees F oral Pulse rate:   95 / minute BP sitting:   131 / 86  (right arm) Cuff size:   large  Vitals Entered By: Jimmy Footman, CMA (December 22, 2009 4:30 PM) CC: Enlarged heart & URI f/u Is Patient Diabetic? No   Primary Care Caron Tardif:  Delbert Harness MD  CC:  Enlarged heart & URI f/u.  History of Present Illness: 45 yo here for follow-up form urgent care visit.  Note from this visit reviewed.  was seen at urgent care, diagnosed with bronchitis, and enlarged heart seen on xray.  Patient very disterssed and tearful about diagnosis of cardiomegaly.  Also discussed very stressed about being unemployed and uninsured.  acute bronchitis:  took 3 days of abx but lost last two days woth of pills.  continued cough, improving dyspnea, no fever.  cardiolmegaly, seen on cxr.  hx of hyeprtension. No chest pain, claudication, pnd, palpitations.    Habits & Providers  Alcohol-Tobacco-Diet     Tobacco Status: current     Tobacco Counseling: to quit use of tobacco products  Allergies: No Known Drug Allergies  Past History:  Past Surgical History: lap cholecystectomy PMH-FH-SH reviewed for relevance  Review of Systems      See HPI  Physical Exam  General:  obese.  tearful.  VS reviewed. Lungs:  Normal respiratory effort, chest expands symmetrically. Lungs are clear to auscultation, no crackles or wheezes. Heart:  Normal rate and regular rhythm. S1 and S2 normal without gallop, murmur, click, rub or other extra sounds. Abdomen:  soft and non-tender.     Impression & Recommendations:  Problem # 1:  ACUTE BRONCHITIS (ICD-466.0)  Received 3 days of prednisone and azithromycin before losing this.  exam normal today.  No new meds, will monitor.  Patient interested in tobacco cessation.    Her updated medication list for this problem  includes:    Singulair 10 Mg Tabs (Montelukast sodium) .Marland Kitchen... 1 tab  by mouth daily    Ventolin Hfa 108 (90 Base) Mcg/act Aers (Albuterol sulfate) .Marland Kitchen... 2 puffs q 4 hour as needed wheezing  Orders: [facility]- Est  Level 4 (19147)  Problem # 2:  CARDIOMEGALY (ICD-429.3)  Likely due to hypertensive disease.  No symptoms of angina, heart failure.  reviewed EKG 1 year ago which was normal.  Ideally would like to get ECHO and or exercise stress test.  Since currently uninsured, will focus on control of hypertension and will readdress with patient in future.  Orders: [facility]- Est  Level 4 (82956)  Problem # 3:  HYPERTENSION, BENIGN (ICD-401.1)  well controlled  Her updated medication list for this problem includes:    Hydrochlorothiazide 25 Mg Tabs (Hydrochlorothiazide) .Marland Kitchen... Take 1 tab by mouth every morning  BP today: 131/86 Prior BP: 129/86 (08/18/2009)  Labs Reviewed: K+: 4.1 (08/18/2009) Creat: : 0.78 (08/18/2009)   Chol: 198 (08/18/2009)   HDL: 51 (08/18/2009)   LDL: 127 (08/18/2009)   TG: 102 (08/18/2009)  Orders: [facility]- Est  Level 4 (21308)  Problem # 4:  DEPRESSION, MAJOR (ICD-296.20)  encouraged  continuation of celexa.  linked insomnia to depression.  encouraged regular exercise- discussed with patient that i do nt recommend ambien and given contrasined financial resuorces would prioritize other medications.  Orders: [facility]-  Est  Level 4 (99214)  Problem # 5:  INADEQUATE MATERIAL RESOURCES (ICD-V60.2)  [facility]  Discussed with patient which medication to place as priroity.  Will leave medications on problem list for now aas she is hopefly of getting medication assitance and being able to resume  Orders: [facility]- Est  Level 4 (81191)  Problem # 6:  INSOMNIA (ICD-780.52) likely related to depression and poor sleep hygeine.  Also possible sleep apnea per history.  advised against ambien, given info on sleep hygeine, uninsured so unable to  obtain sleep study at this time.  The following medications were removed from the medication list:    Ambien 5 Mg Tabs (Zolpidem tartrate) .Marland Kitchen... Take 1-2 tabs by mouth at bedtime as needed insomnia  Complete Medication List: 1)  Hydrochlorothiazide 25 Mg Tabs (Hydrochlorothiazide) .... Take 1 tab by mouth every morning 2)  Claritin 10 Mg Tabs (Loratadine) .Marland Kitchen.. 1 tab by mouth q am 3)  Singulair 10 Mg Tabs (Montelukast sodium) .Marland Kitchen.. 1 tab  by mouth daily 4)  Xyzal 5 Mg Tabs (Levocetirizine dihydrochloride) .Marland Kitchen.. 1 tab by mouth q pm 5)  Ventolin Hfa 108 (90 Base) Mcg/act Aers (Albuterol sulfate) .... 2 puffs q 4 hour as needed wheezing 6)  Flonase 50 Mcg/act Susp (Fluticasone propionate) .... 2 sprays in each nostril daily 7)  Citalopram Hydrobromide 40 Mg Tabs (Citalopram hydrobromide) .Marland Kitchen.. 1 tab by mouth daily for depression 8)  Gabapentin 100 Mg Caps (Gabapentin) .Marland Kitchen.. 1 tab by mouth three times a day for pain 9)  Cholestyramine 4 Gm Pack (Cholestyramine) .... Take 2-4 grams once daily 10)  Ergocalciferol 50000 Unit Caps (Ergocalciferol) .... Take one tablet weekly for 8 weeks 11)  Ferrous Sulfate 325 (65 Fe) Mg Tabs (Ferrous sulfate) .... Take one tablet twice a day 12)  Ibuprofen 800 Mg Tabs (Ibuprofen) .... One tablet every 8 hours for pain  Patient Instructions: 1)  Most important medicines to take are HCTZ, claritin, albuterol. 2)  See Jaynee Eagles  3)  See info on [facility] [place] 4)  See info on sleep tips 5)  Take tylenol extra strength for pain. 6)  exercise daily for your weight, stress, and sleep Prescriptions: IBUPROFEN 800 MG TABS (IBUPROFEN) one tablet every 8 hours for pain  #30 x 0   Entered and Authorized by:   Delbert Harness MD   Signed by:   Delbert Harness MD on 12/22/2009   Method used:   Electronically to        [facility] [phone]* (retail)       [address]       [place], [place]  [address]       Ph: [phone]       Fax: [phone]   RxID:    [id number]    Orders Added: 1)  [facility]- Est  Level 4 [47425]

## 2010-03-17 NOTE — Progress Notes (Signed)
Summary: Lab Res  Phone Note Call from Patient Call back at Home Phone [phone]   Caller: Patient Summary of Call: Pt says she got a call from someone concerning her lab work. Initial call taken by: Clydell Hakim,  August 19, 2009 12:09 PM  Follow-up for Phone Call        Discussed with patient vitamin D and iron supplementation.  Will make follow-up appt to further discuss anemia workup. Follow-up by: Delbert Harness MD,  August 19, 2009 12:20 PM

## 2010-03-23 NOTE — Progress Notes (Signed)
Summary: refill  Phone Note Refill Request Call back at Home Phone [phone] Message from:  Patient  Refills Requested: Medication #1:  NYSTATIN 100000 UNIT/GM CREA apply to intertigo two times a day as needed for rash. needs one more tube - wants it written and will pick up tomorrrow  Initial call taken by: De Nurse,  March 14, 2010 3:33 PM  Follow-up for Phone Call        ready for pick-up. Follow-up by: Helane Rima DO,  March 14, 2010 4:05 PM    Prescriptions: NYSTATIN 100000 UNIT/GM CREA (NYSTATIN) apply to intertigo two times a day as needed for rash  #15 x 0   Entered and Authorized by:   Helane Rima DO   Signed by:   Helane Rima DO on 03/14/2010   Method used:   Print then Give to Patient   RxID:   [id number]

## 2010-04-11 ENCOUNTER — Telehealth: Payer: Self-pay | Admitting: Family Medicine

## 2010-04-11 ENCOUNTER — Other Ambulatory Visit: Payer: Self-pay | Admitting: Family Medicine

## 2010-04-11 DIAGNOSIS — I10 Essential (primary) hypertension: Secondary | ICD-10-CM

## 2010-04-11 MED ORDER — HYDROCHLOROTHIAZIDE 25 MG PO TABS: 25.0000 mg | ORAL_TABLET | ORAL | Status: DC

## 2010-04-11 NOTE — Telephone Encounter (Signed)
States that she has been trying to get her meds from [facility] [address] since last Wed.  They keep telling her that they have faxed it in, she is now out. HCTZ pls advise

## 2010-04-11 NOTE — Telephone Encounter (Signed)
Sent. If it does not go through this time, please send to [facility] to call in. Thanks!

## 2010-04-14 ENCOUNTER — Other Ambulatory Visit: Payer: Self-pay | Admitting: Family Medicine

## 2010-05-22 LAB — DIFFERENTIAL
Basophils Absolute: 0 10*3/uL (ref 0.0–0.1)
Basophils Relative: 0 % (ref 0–1)
Eosinophils Absolute: 0.2 10*3/uL (ref 0.0–0.7)
Eosinophils Relative: 2 % (ref 0–5)
Lymphocytes Relative: 25 % (ref 12–46)
Lymphs Abs: 2.7 10*3/uL (ref 0.7–4.0)
Monocytes Absolute: 0.7 10*3/uL (ref 0.1–1.0)

## 2010-05-22 LAB — ANAEROBIC CULTURE

## 2010-05-22 LAB — BASIC METABOLIC PANEL
BUN: 8 mg/dL (ref 6–23)
CO2: 23 mEq/L (ref 19–32)
Calcium: 8.7 mg/dL (ref 8.4–10.5)
Chloride: 108 mEq/L (ref 96–112)
Creatinine, Ser: 0.79 mg/dL (ref 0.4–1.2)
GFR calc Af Amer: >60 mL/min (ref >60)
GFR calc non Af Amer: >60 mL/min (ref >60)
Potassium: 3.6 mEq/L (ref 3.5–5.1)
Sodium: 138 mEq/L (ref 135–145)

## 2010-05-22 LAB — CBC
MCV: 74.3 fL — ABNORMAL LOW (ref 78.0–100.0)
Platelets: 495 10*3/uL — ABNORMAL HIGH (ref 150–400)
RBC: 4.32 MIL/uL (ref 3.87–5.11)
WBC: 10.8 10*3/uL — ABNORMAL HIGH (ref 4.0–10.5)

## 2010-05-22 LAB — PROTIME-INR
INR: 1 (ref 0.00–1.49)
Prothrombin Time: 13.5 seconds (ref 11.6–15.2)

## 2010-05-22 LAB — CULTURE, ROUTINE-ABSCESS

## 2010-06-03 ENCOUNTER — Encounter: Payer: Self-pay | Admitting: Family Medicine

## 2010-06-03 ENCOUNTER — Ambulatory Visit (INDEPENDENT_AMBULATORY_CARE_PROVIDER_SITE_OTHER): Payer: Self-pay | Admitting: Family Medicine

## 2010-06-03 VITALS — BP 157/103 | HR 114 | Temp 97.9°F | Ht 63.0 in | Wt 223.6 lb

## 2010-06-03 DIAGNOSIS — A499 Bacterial infection, unspecified: Secondary | ICD-10-CM

## 2010-06-03 DIAGNOSIS — B9689 Other specified bacterial agents as the cause of diseases classified elsewhere: Secondary | ICD-10-CM

## 2010-06-03 DIAGNOSIS — N760 Acute vaginitis: Secondary | ICD-10-CM

## 2010-06-03 LAB — POCT WET PREP (WET MOUNT): Yeast Wet Prep HPF POC: NEGATIVE

## 2010-06-03 MED ORDER — FLUCONAZOLE 150 MG PO TABS: 150.0000 mg | ORAL_TABLET | ORAL | Status: DC

## 2010-06-03 MED ORDER — METRONIDAZOLE 500 MG PO TABS: 500.0000 mg | ORAL_TABLET | Freq: Two times a day (BID) | ORAL | Status: AC

## 2010-06-03 MED ORDER — CLOTRIMAZOLE 1 % VA CREA: 1.0000 | TOPICAL_CREAM | VAGINAL | Status: AC

## 2010-06-03 NOTE — Patient Instructions (Signed)
Do not use Vaseline for lubrication, this causes problems; purchase a water based lubricant KY Jelly or equivalent. Take the pills as directed today and Sunday Use the cream at night for 7 nights No sex for one week because of the pain and inflammation

## 2010-06-03 NOTE — Progress Notes (Signed)
  Subjective:    Patient ID: Crystal Brennan, female    DOB: 09-Aug-1965, 45 y.o.   MRN: [id number]  HPI  Certain that she has a yeast infection, treated a month ago for this.  Husband uses Vaseline for lubrication.  Her vagina is very painful.  She does not feel at risk for STD.    Review of Systems  Constitutional: Negative for fever.       See HPI  Gastrointestinal: Negative for abdominal pain.  Genitourinary: Positive for vaginal discharge and vaginal pain. Negative for dysuria.       Vaginal itching       Objective:   Physical Exam  Constitutional: She appears well-developed.       Morbidly obese  Genitourinary:       Irritation and redness both external in internal, thick white discharge          Assessment & Plan:

## 2010-06-03 NOTE — Assessment & Plan Note (Addendum)
Treat with both systemic antifungal and topical for 7 days, instructed to use water based lubricants. Add metronidazole for BV

## 2010-06-09 ENCOUNTER — Ambulatory Visit: Payer: Self-pay | Admitting: Family Medicine

## 2010-06-14 ENCOUNTER — Ambulatory Visit (INDEPENDENT_AMBULATORY_CARE_PROVIDER_SITE_OTHER): Payer: Self-pay | Admitting: Family Medicine

## 2010-06-14 VITALS — BP 141/90 | HR 109 | Temp 97.9°F | Wt 229.0 lb

## 2010-06-14 DIAGNOSIS — I10 Essential (primary) hypertension: Secondary | ICD-10-CM

## 2010-06-14 DIAGNOSIS — N921 Excessive and frequent menstruation with irregular cycle: Secondary | ICD-10-CM

## 2010-06-14 DIAGNOSIS — E669 Obesity, unspecified: Secondary | ICD-10-CM

## 2010-06-14 DIAGNOSIS — N920 Excessive and frequent menstruation with regular cycle: Secondary | ICD-10-CM

## 2010-06-14 DIAGNOSIS — IMO0002 Reserved for concepts with insufficient information to code with codable children: Secondary | ICD-10-CM

## 2010-06-14 MED ORDER — CYCLOBENZAPRINE HCL 5 MG PO TABS: 5.0000 mg | ORAL_TABLET | Freq: Three times a day (TID) | ORAL | Status: AC | PRN

## 2010-06-14 NOTE — Patient Instructions (Signed)
It was nice to see you today!  I am adding Flexeril AS NEEDED for back spasm.  We will call with your GYNECOLOGY referral.

## 2010-06-16 ENCOUNTER — Encounter: Payer: Self-pay | Admitting: Family Medicine

## 2010-06-16 NOTE — Assessment & Plan Note (Signed)
No red flags. Unclear Hx - will go ahead and refer to [facility] for evaluation as patient endorses Hx of fibroids and wa told that she needs hysterectomy. Offered Hgb testing today and patient refused.

## 2010-06-16 NOTE — Assessment & Plan Note (Signed)
No red flags. More c/w muscle spasm today. Encouraged continued exercise. Rx Flexeril.

## 2010-06-16 NOTE — Assessment & Plan Note (Signed)
Patient to f/u with health coach. Encouraged continued exercise. She would likely benefit from nutrition education.

## 2010-06-16 NOTE — Assessment & Plan Note (Signed)
Patient to recheck 3 times per week at home and call if SBP consistently over 140.
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
## 2010-06-28 NOTE — Op Note (Signed)
Crystal Brennan, Crystal Brennan                 ACCOUNT NO.:  [id number]   MEDICAL RECORD NO.:  [id number]          PATIENT TYPE:  INP   LOCATION:  1320                         FACILITY:  [facility]   PHYSICIAN:  Velora Heckler, MD      DATE OF BIRTH:  December 27, 1965   DATE OF PROCEDURE:  09/11/2008  DATE OF DISCHARGE:                               OPERATIVE REPORT   PREOPERATIVE DIAGNOSIS:  Perirectal abscess.   POSTOPERATIVE DIAGNOSIS:  Perirectal abscess.   PROCEDURE:  Incision drainage and open packing of perirectal abscess.   SURGEON:  Velora Heckler, MD, FACS   ANESTHESIA:  General per Hezzie Bump. Okey Dupre, M.D.   ESTIMATED BLOOD LOSS:  Minimal.   PREPARATION:  Betadine.   COMPLICATIONS:  None.   INDICATIONS:  The patient is 45 year old black female who presented to  the emergency department with a 5-day history of rectal pain.  Examination showed a perirectal abscess.  The patient refused further  examination or treatment in the emergency department.  She was admitted  to the hospital for intravenous antibiotics and prepared for the  operating room.   BODY OF REPORT:  The procedure was done in OR #7 at the [facility].  The patient was brought to the operating room and  placed in supine position on the operating room table.  Following  administration of general anesthesia, the patient was placed in  lithotomy and prepped and draped in the usual strict aseptic fashion.  After ascertaining that an adequate level of anesthesia had been  achieved, a radial incision was made in the right anterior perianal  region with the electrocautery.  Dissection was carried into the  subcutaneous tissues.  The abscess cavity was opened and a large amount  of purulent fluid was evacuated.  Cultures were obtained both  aerobically and anaerobically aerobically for the laboratory.  Using  blunt digital dissection the loculations of the abscess cavity were  broken down.  The cavity measures  approximately 4 cm in depth.  It was  irrigated with warm saline.  Hemostasis was obtained with  electrocautery.  The cavity was packed with half-inch Iodoform gauze  packing.  Dry gauze dressings and ABD pads were placed.   The patient was awakened from anesthesia and brought to the recovery  room in stable condition.  The patient tolerated the procedure well.      Velora Heckler, MD  Electronically Signed     TMG/MEDQ  D:  09/11/2008  T:  09/11/2008  Job:  [id number]

## 2010-06-28 NOTE — H&P (Signed)
NAMESELIA, WAREING                 ACCOUNT NO.:  [id number]   MEDICAL RECORD NO.:  [id number]          PATIENT TYPE:  INP   LOCATION:  1320                         FACILITY:  [facility]   PHYSICIAN:  Velora Heckler, MD      DATE OF BIRTH:  [birth year]   DATE OF ADMISSION:  09/10/2008  DATE OF DISCHARGE:                              HISTORY & PHYSICAL   ADMITTING DIAGNOSIS:  Perirectal abscess.   BRIEF HISTORY:  Crystal Brennan is a 45 year old black female from  [place], [place].  She presents with her mother to the  emergency department with a 5-day history of perirectal pain.  The  patient was seen on September 09, 2008, at the [facility].  She was told  to seek treatment at [facility] emergency department.  The patient went  home and returned at 4:30 p.m. on September 10, 2008, to the emergency  department for evaluation.  General surgery was called to evaluate for a  possible perirectal abscess.   PAST MEDICAL HISTORY:  1. History of gastroesophageal reflux disease.  2. Hypertension.  3. Status post laparoscopic cholecystectomy in December 2009.   MEDICATIONS:  Hydrochlorothiazide.   ALLERGIES:  1. CODEINE.  2. HYDROCODONE.  3. PERCOCET.   SOCIAL HISTORY:  The patient is single.  She has three children.  She is  unemployed.  She smokes a pack of cigarettes a day.  She drinks alcohol  frequently.  She is accompanied by her mother.  She denies illicit drug  use.   FAMILY HISTORY:  Noncontributory.   Fifteen system review otherwise without significant findings.  The  patient denies diabetes.   PHYSICAL EXAMINATION:  GENERAL:  A 45 year old morbidly obese black  female on a stretcher in the emergency department.  VITAL SIGNS:  Temperature 98.5, pulse 89, respirations 20, blood pressure 119/57.  HEENT:  Shows her to be normocephalic, atraumatic.  Sclerae clear.  Conjunctivae clear.  Dentition fair.  Mucous membranes moist.  Voice  normal.  NECK:  Supple, nontender without  mass.  No thyroid nodularity.  No  lymphadenopathy.  No tenderness.  LUNGS:  Clear to auscultation bilaterally.  CARDIAC:  Shows regular rate and rhythm without murmur.  Peripheral  pulses are full.  EXTREMITIES:  Nontender without edema.  ABDOMEN:  Soft, obese.  Bowel sounds are present.  No tenderness.  Examination of the perineum shows marked tenderness in the medial right  buttock.  The tenderness appears out of proportion for physical  findings.  There is a perianal area of fluctuance in the right anterior  anoderm.  There is no drainage.  There is mild induration.  This is  exquisitely tender and examination is limited by patient discomfort.  NEUROLOGICAL:  The patient is alert and oriented without focal deficit.   DATA REVIEWED:  CBC, basic metabolic profile, and prothrombin time are  all pending at the time of dictation.   A 12-lead EKG pending at this time of dictation.   IMPRESSION:  Perirectal abscess.   PLAN:  The patient will be admitted on the general surgical service.  Intravenous  antibiotics will be initiated.  The patient will be prepared  and taken to the operating room later in the day on September 11, 2008, for  incision, drainage and open packing of perirectal abscess.      Velora Heckler, MD  Electronically Signed     TMG/MEDQ  D:  09/11/2008  T:  09/11/2008  Job:  [id number]

## 2010-06-28 NOTE — Op Note (Signed)
Crystal Brennan, Crystal Brennan                 ACCOUNT NO.:  [id number]   MEDICAL RECORD NO.:  [id number]          PATIENT TYPE:  OIB   LOCATION:  1528                         FACILITY:  [facility]   PHYSICIAN:  Sandria Bales. Ezzard Standing, M.D.  DATE OF BIRTH:  31-Dec-1965   DATE OF PROCEDURE:  01/10/2008  DATE OF DISCHARGE:                               OPERATIVE REPORT   Date of surgery ??   PREOPERATIVE DIAGNOSIS:  Biliary dyskinesia.   POSTOPERATIVE DIAGNOSIS:  Biliary dyskinesia.   PROCEDURES:  Laparoscopic cholecystectomy with intraoperative  cholangiogram.   SURGEON:  Sandria Bales. Ezzard Standing, M.D.   FIRST ASSISTANT:  Anselm Pancoast. Zachery Dakins, M.D.   ANESTHESIA:  General endotracheal.   ESTIMATED BLOOD LOSS:  Minimal.   INDICATIONS FOR PROCEDURE:  [name] is an obese, 45 year old black  female who is a patient Dr. Lafonda Mosses of the [facility], who also sees Dr. Charna Elizabeth, who has some vague epigastric  pain, underwent an ultrasound which was negative but had a HIDA scan  which showed a 17% ejection fraction.  A lengthy discussion was carried  out with the patient regarding the options for treatment, that surgery  for biliary dyskinesia may at best help her for 50% or 60% of her  symptoms.  There is a 40% to 50% chance she will have recurrent  symptoms.  I think she understands this as the options for going into  surgery.  Actually, I had seen her in the emergency room about 8 days  ago, but she could not wait to see me in the emergency room and ate a  hamburger which precluded operating on her that day.   I discussed with her the potential complications of gallbladder surgery.  Potential complications include, but are not limited to, bleeding,  infection, common bile duct injury, the possibility of open surgery and  the possibility that she could have recurrent or persistent abdominal  pain and symptoms.   OPERATIVE NOTE:  The patient was placed in supine position, given a  general  endotracheal anesthetic.  Her abdomen was prepped with Betadine  solution and sterilely draped.  She had PS stockings and 1 gram of Ancef  was given to her.  A time-out was held, identifying the patient and  procedure.   An infraumbilical incision was made with sharp dissection, carried down  to the abdominal cavity.  A 0-degree, 10-mm laparoscope was inserted  through a 12-mm Hasson trocar and the Hasson trocar was secured with a 0  Vicryl suture.  She had some fat stuck around her umbilicus.  Whether  this represented some fat in a hernia or not is unclear but I kind of  went around this and put the trocar in but did not try to take the fat  down.  Because of her obesity, I had to put in a fifth port, so she had  a 10-mm subxiphoid port, a 5-mm mid subcostal port, a 5-mm lateral  subcostal port and then I put a 5-mm port about midway between xiphoid  and her umbilicus.  I used an extra  hand to hold her fat and transverse  colon down.   I identified the gallbladder which I rotated cephalad.  She did have  some adhesions along the anterior gallbladder wall which were taken down  with Bovie electrocautery.  I then identified the cystic artery which I  doubly endoclipped and divided, then the cystic duct.  I placed a clip  on the gallbladder side of the cystic duct and shot an intraoperative  cholangiogram.   The intraoperative cholangiogram was shot using a cut-off Taut catheter  inserted through a 14-gauge Jelco.  The Taut catheter was then inserted  into the side of the cut cystic duct and clipped.  Intraoperative  cholangiogram was shot using half-strength Hypaque solution which showed  free flow of contrast down the cystic duct into the common bile duct,  into the duodenum and up the hepatic radicles.  This was felt to be a  normal intraoperative cholangiogram with no evidence of extravasation or  filling defect.   The Taut catheter was then removed, the cystic duct triply  endoclipped  and divided.  The gallbladder was then sharply and bluntly dissected  from the gallbladder bed.  There was also a little posterior branch of  the cystic artery which I clipped.  The gallbladder was then placed in  an EndoCatch bag.  The gallbladder bed was then reevaluated.  There was  no bleeding at the gallbladder bed or any bile leak.  The triangle of  Calot was examined and unremarkable.   The gallbladder was then delivered through the umbilicus and sent to  pathology.  The umbilical port was closed with 0 Vicryl suture.  The  skin in each port was closed with a 5-0Vicryl suture, painted with  tincture of benzoin and steri-stripped.  The patient tolerated the  procedure well, was transported to the recovery room in good condition.  Sponge and needle count were correct at the end of the case.      Sandria Bales. Ezzard Standing, M.D.  Electronically Signed     DHN/MEDQ  D:  01/10/2008  T:  01/10/2008  Job:  [id number]   cc:   Asher Muir, MD   Anselmo Rod, M.D.  Fax: [phone]

## 2010-08-01 ENCOUNTER — Encounter: Payer: Self-pay | Admitting: Obstetrics & Gynecology

## 2010-09-14 ENCOUNTER — Ambulatory Visit (INDEPENDENT_AMBULATORY_CARE_PROVIDER_SITE_OTHER): Payer: Self-pay

## 2010-09-14 ENCOUNTER — Inpatient Hospital Stay (INDEPENDENT_AMBULATORY_CARE_PROVIDER_SITE_OTHER)
Admission: RE | Admit: 2010-09-14 | Discharge: 2010-09-14 | Disposition: A | Discharge status: Home / Self Care | Payer: Self-pay | Source: Ambulatory Visit | Attending: Emergency Medicine | Admitting: Emergency Medicine

## 2010-09-14 DIAGNOSIS — S92919A Unspecified fracture of unspecified toe(s), initial encounter for closed fracture: Secondary | ICD-10-CM

## 2010-10-13 ENCOUNTER — Emergency Department (HOSPITAL_COMMUNITY)
Admission: EM | Admit: 2010-10-13 | Discharge: 2010-10-13 | Disposition: A | Discharge status: Home / Self Care | Payer: Self-pay | Attending: Emergency Medicine | Admitting: Emergency Medicine

## 2010-10-13 DIAGNOSIS — M542 Cervicalgia: Secondary | ICD-10-CM | POA: Insufficient documentation

## 2010-10-13 DIAGNOSIS — I10 Essential (primary) hypertension: Secondary | ICD-10-CM | POA: Insufficient documentation

## 2010-10-13 DIAGNOSIS — M62838 Other muscle spasm: Secondary | ICD-10-CM | POA: Insufficient documentation

## 2010-10-13 DIAGNOSIS — K219 Gastro-esophageal reflux disease without esophagitis: Secondary | ICD-10-CM | POA: Insufficient documentation

## 2010-11-16 LAB — LIPASE, BLOOD: Lipase: 16

## 2010-11-16 LAB — COMPREHENSIVE METABOLIC PANEL
ALT: 13
AST: 12
AST: 16
BUN: 4 — ABNORMAL LOW
CO2: 22
CO2: 24
Calcium: 9
Calcium: 9.2
Chloride: 108
Creatinine, Ser: 0.87
GFR calc Af Amer: >60
GFR calc Af Amer: >60
GFR calc non Af Amer: >60
Glucose, Bld: 107 — ABNORMAL HIGH
Potassium: 3.6
Sodium: 135
Total Bilirubin: 0.4
Total Protein: 6.6

## 2010-11-16 LAB — CBC
HCT: 33 — ABNORMAL LOW
Hemoglobin: 10.5 — ABNORMAL LOW
MCHC: 31.8
MCHC: 32.1
MCV: 74.3 — ABNORMAL LOW
RBC: 4.35
RBC: 4.44
RDW: 18 — ABNORMAL HIGH
WBC: 8.7

## 2010-11-16 LAB — DIFFERENTIAL
Basophils Absolute: 0
Eosinophils Absolute: 0.3
Eosinophils Relative: 3
Eosinophils Relative: 3
Lymphocytes Relative: 19
Lymphs Abs: 2.2
Monocytes Absolute: 0.8
Monocytes Relative: 7
Neutrophils Relative %: 69

## 2010-11-16 LAB — PREGNANCY, URINE: Preg Test, Ur: NEGATIVE

## 2011-04-12 ENCOUNTER — Telehealth: Payer: Self-pay | Admitting: Family Medicine

## 2011-04-12 DIAGNOSIS — I10 Essential (primary) hypertension: Secondary | ICD-10-CM

## 2011-04-12 MED ORDER — HYDROCHLOROTHIAZIDE 25 MG PO TABS
25.0000 mg | ORAL_TABLET | ORAL | Status: DC
Start: 2011-04-12 — End: 2011-05-15

## 2011-04-12 NOTE — Telephone Encounter (Signed)
Patient needs a refill of hydrochlorothiazide called in to her pharmacy.

## 2011-05-11 ENCOUNTER — Ambulatory Visit: Payer: Self-pay | Admitting: Family Medicine

## 2011-05-15 ENCOUNTER — Other Ambulatory Visit: Payer: Self-pay | Admitting: Family Medicine

## 2011-10-20 ENCOUNTER — Encounter: Payer: Self-pay | Admitting: Cardiology

## 2011-11-30 ENCOUNTER — Encounter (HOSPITAL_COMMUNITY): Payer: Self-pay | Admitting: *Deleted

## 2011-11-30 ENCOUNTER — Emergency Department (HOSPITAL_COMMUNITY)
Admission: EM | Admit: 2011-11-30 | Discharge: 2011-12-01 | Disposition: A | Discharge status: Home / Self Care | Payer: Self-pay | Attending: Emergency Medicine | Admitting: Emergency Medicine

## 2011-11-30 DIAGNOSIS — G8929 Other chronic pain: Secondary | ICD-10-CM | POA: Insufficient documentation

## 2011-11-30 DIAGNOSIS — R42 Dizziness and giddiness: Secondary | ICD-10-CM | POA: Insufficient documentation

## 2011-11-30 DIAGNOSIS — G4700 Insomnia, unspecified: Secondary | ICD-10-CM | POA: Insufficient documentation

## 2011-11-30 DIAGNOSIS — I10 Essential (primary) hypertension: Secondary | ICD-10-CM | POA: Insufficient documentation

## 2011-11-30 DIAGNOSIS — D649 Anemia, unspecified: Secondary | ICD-10-CM | POA: Insufficient documentation

## 2011-11-30 DIAGNOSIS — F17200 Nicotine dependence, unspecified, uncomplicated: Secondary | ICD-10-CM | POA: Insufficient documentation

## 2011-11-30 NOTE — ED Notes (Signed)
Pt c/o dizziness; weak; stumbling x 1 wk; states when she turns her head to the left has left ear pain

## 2011-12-01 ENCOUNTER — Other Ambulatory Visit: Payer: Self-pay

## 2011-12-01 LAB — CBC WITH DIFFERENTIAL/PLATELET
Basophils Absolute: 0 10*3/uL (ref 0.0–0.1)
Eosinophils Absolute: 0.4 10*3/uL (ref 0.0–0.7)
Eosinophils Relative: 4 % (ref 0–5)
Lymphs Abs: 2.5 10*3/uL (ref 0.7–4.0)
MCH: 26.7 pg (ref 26.0–34.0)
MCHC: 33.2 g/dL (ref 30.0–36.0)
MCV: 80.3 fL (ref 78.0–100.0)
Platelets: 449 10*3/uL — ABNORMAL HIGH (ref 150–400)
RDW: 16.2 % — ABNORMAL HIGH (ref 11.5–15.5)

## 2011-12-01 LAB — URINALYSIS, ROUTINE W REFLEX MICROSCOPIC
Hgb urine dipstick: NEGATIVE
Protein, ur: NEGATIVE mg/dL
Urobilinogen, UA: 0.2 mg/dL (ref 0.0–1.0)

## 2011-12-01 LAB — BASIC METABOLIC PANEL
Calcium: 9.3 mg/dL (ref 8.4–10.5)
GFR calc non Af Amer: >90 mL/min (ref >90)
Glucose, Bld: 102 mg/dL — ABNORMAL HIGH (ref 70–99)
Sodium: 136 mEq/L (ref 135–145)

## 2011-12-01 MED ORDER — MECLIZINE HCL 25 MG PO TABS
25.0000 mg | ORAL_TABLET | Freq: Once | ORAL | Status: AC
  Administered 2011-12-01: 25 mg via ORAL
  Filled 2011-12-01: qty 1

## 2011-12-01 MED ORDER — SODIUM CHLORIDE 0.9 % IV BOLUS (SEPSIS)
1000.0000 mL | Freq: Once | INTRAVENOUS | Status: AC
  Administered 2011-12-01: 1000 mL via INTRAVENOUS

## 2011-12-01 MED ORDER — MECLIZINE HCL 25 MG PO TABS: 25.0000 mg | ORAL_TABLET | Freq: Three times a day (TID) | ORAL | Status: DC | PRN

## 2011-12-01 NOTE — ED Provider Notes (Signed)
History     CSN: [id number]  Arrival date & time 11/30/11  2317   First MD Initiated Contact with Patient 12/01/11 0005      Chief Complaint  Patient presents with  . Dizziness    (Consider location/radiation/quality/duration/timing/severity/associated sxs/prior treatment) HPI Comments: Patient presents today with a chief complaint of dizziness.  She reports that she has had this dizziness  for the past week.  She describes the dizziness as a feeling that the room is spinning.  She states that she has been diagnosed with vertigo in the past.  Symptoms today feel similar to when she has had vertigo in the past.  She reports that the symptoms are worse when she turns her head or when she goes from a lying to sitting position.  She has not taken anything for her symptoms.  She states that she has had some generalized weakness over the past week, but no focal weakness.  No visual field defects or diplopia.  No facial asymmetry.  No difficulty swallowing.  No chest pain.  No SOB.  No fever or chills.  The history is provided by the patient.    Past Medical History  Diagnosis Date  . Chronic back pain   . Hypertension   . Depression   . Anxiety   . Anemia   . Menometrorrhagia   . Allergy   . Insomnia     History reviewed. No pertinent past surgical history.  No family history on file.  History  Substance Use Topics  . Smoking status: Current Every Day Smoker -- 0.5 packs/day    Types: Cigarettes  . Smokeless tobacco: Not on file  . Alcohol Use: No    OB History    Grav Para Term Preterm Abortions TAB SAB Ect Mult Living                  Review of Systems  Constitutional: Negative for fever and chills.  HENT: Negative for neck pain and neck stiffness.   Eyes:       No visual field defects No diplopia    Respiratory: Negative for shortness of breath.   Cardiovascular: Negative for chest pain.  Gastrointestinal: Negative for nausea and vomiting.  Neurological:  Positive for dizziness. Negative for syncope, facial asymmetry, speech difficulty, numbness and headaches.       Generalized weakness  Psychiatric/Behavioral: Negative for confusion.    Allergies  Codeine  Home Medications   Current Outpatient Rx  Name Route Sig Dispense Refill  . HYDROCHLOROTHIAZIDE 25 MG PO TABS  TAKE ONE TABLET BY MOUTH EVERY DAY IN THE MORNING 90 tablet 1    BP 104/65  Pulse 88  Temp 98.3 F (36.8 C) (Oral)  Resp 18  SpO2 97%  LMP 11/23/2011  Physical Exam  Nursing note and vitals reviewed. Constitutional: She appears well-developed and well-nourished. No distress.  HENT:  Head: Normocephalic and atraumatic.  Mouth/Throat: Oropharynx is clear and moist.  Eyes: EOM are normal. Pupils are equal, round, and reactive to light.  Neck: Normal range of motion. Neck supple.  Cardiovascular: Normal rate, regular rhythm and normal heart sounds.   Pulmonary/Chest: Effort normal and breath sounds normal.  Musculoskeletal: Normal range of motion.  Neurological: She is alert. She has normal strength. No cranial nerve deficit or sensory deficit. She displays a negative Romberg sign. Coordination and gait normal.       No ataxia Normal finger to nose testing Normal rapid alternating movements. No visual field defects  Skin: Skin is warm and dry. She is not diaphoretic.  Psychiatric: She has a normal mood and affect.    ED Course  Procedures (including critical care time)   Labs Reviewed  CBC WITH DIFFERENTIAL  BASIC METABOLIC PANEL   No results found.   No diagnosis found.    MDM  Symptoms consistent with BPV.  Patient with history of the same.  Symptoms completely resolved after Antivert.  Labs unremarkable.  No focal neurological deficits on exam.  EKG unremarkable.  No chest pain or SOB.  Therefore, feel that patient can be discharged home.  Return precautions discussed.        Pascal Lux [place], PA-C 12/01/11 1720

## 2011-12-04 NOTE — ED Provider Notes (Signed)
Medical screening examination/treatment/procedure(s) were performed by non-physician practitioner and as supervising physician I was immediately available for consultation/collaboration.   Celene Kras, MD 12/04/11 2209

## 2013-02-13 DIAGNOSIS — K5792 Diverticulitis of intestine, part unspecified, without perforation or abscess without bleeding: Secondary | ICD-10-CM

## 2013-02-13 HISTORY — DX: Diverticulitis of intestine, part unspecified, without perforation or abscess without bleeding: K57.92

## 2013-10-05 ENCOUNTER — Emergency Department (HOSPITAL_COMMUNITY): Payer: Self-pay

## 2013-10-05 ENCOUNTER — Inpatient Hospital Stay (HOSPITAL_COMMUNITY)
Admission: EM | Admit: 2013-10-05 | Discharge: 2013-10-07 | DRG: 392 | Disposition: A | Discharge status: Home / Self Care | Payer: Self-pay | Attending: Internal Medicine | Admitting: Internal Medicine

## 2013-10-05 ENCOUNTER — Encounter (HOSPITAL_COMMUNITY): Payer: Self-pay | Admitting: Emergency Medicine

## 2013-10-05 DIAGNOSIS — K7689 Other specified diseases of liver: Secondary | ICD-10-CM | POA: Diagnosis present

## 2013-10-05 DIAGNOSIS — F329 Major depressive disorder, single episode, unspecified: Secondary | ICD-10-CM

## 2013-10-05 DIAGNOSIS — D573 Sickle-cell trait: Secondary | ICD-10-CM

## 2013-10-05 DIAGNOSIS — F3289 Other specified depressive episodes: Secondary | ICD-10-CM | POA: Diagnosis present

## 2013-10-05 DIAGNOSIS — N921 Excessive and frequent menstruation with irregular cycle: Secondary | ICD-10-CM

## 2013-10-05 DIAGNOSIS — Z9089 Acquired absence of other organs: Secondary | ICD-10-CM

## 2013-10-05 DIAGNOSIS — G8929 Other chronic pain: Secondary | ICD-10-CM | POA: Diagnosis present

## 2013-10-05 DIAGNOSIS — R112 Nausea with vomiting, unspecified: Secondary | ICD-10-CM

## 2013-10-05 DIAGNOSIS — IMO0002 Reserved for concepts with insufficient information to code with codable children: Secondary | ICD-10-CM

## 2013-10-05 DIAGNOSIS — D649 Anemia, unspecified: Secondary | ICD-10-CM

## 2013-10-05 DIAGNOSIS — F17200 Nicotine dependence, unspecified, uncomplicated: Secondary | ICD-10-CM | POA: Diagnosis present

## 2013-10-05 DIAGNOSIS — F411 Generalized anxiety disorder: Secondary | ICD-10-CM | POA: Diagnosis present

## 2013-10-05 DIAGNOSIS — I517 Cardiomegaly: Secondary | ICD-10-CM

## 2013-10-05 DIAGNOSIS — G4700 Insomnia, unspecified: Secondary | ICD-10-CM

## 2013-10-05 DIAGNOSIS — J309 Allergic rhinitis, unspecified: Secondary | ICD-10-CM

## 2013-10-05 DIAGNOSIS — K5732 Diverticulitis of large intestine without perforation or abscess without bleeding: Principal | ICD-10-CM

## 2013-10-05 DIAGNOSIS — E669 Obesity, unspecified: Secondary | ICD-10-CM

## 2013-10-05 DIAGNOSIS — I10 Essential (primary) hypertension: Secondary | ICD-10-CM

## 2013-10-05 DIAGNOSIS — R1013 Epigastric pain: Secondary | ICD-10-CM

## 2013-10-05 LAB — CBC WITH DIFFERENTIAL/PLATELET
Basophils Absolute: 0 10*3/uL (ref 0.0–0.1)
Basophils Relative: 0 % (ref 0–1)
Eosinophils Absolute: 0.4 10*3/uL (ref 0.0–0.7)
Eosinophils Relative: 4 % (ref 0–5)
HEMATOCRIT: 39.3 % (ref 36.0–46.0)
HEMOGLOBIN: 13.5 g/dL (ref 12.0–15.0)
LYMPHS ABS: 1.9 10*3/uL (ref 0.7–4.0)
Lymphocytes Relative: 22 % (ref 12–46)
MCH: 29.5 pg (ref 26.0–34.0)
MCHC: 34.4 g/dL (ref 30.0–36.0)
MCV: 85.8 fL (ref 78.0–100.0)
MONO ABS: 0.6 10*3/uL (ref 0.1–1.0)
MONOS PCT: 7 % (ref 3–12)
NEUTROS ABS: 5.8 10*3/uL (ref 1.7–7.7)
NEUTROS PCT: 67 % (ref 43–77)
Platelets: 318 10*3/uL (ref 150–400)
RBC: 4.58 MIL/uL (ref 3.87–5.11)
RDW: 17.4 % — ABNORMAL HIGH (ref 11.5–15.5)
WBC: 8.7 10*3/uL (ref 4.0–10.5)

## 2013-10-05 LAB — COMPREHENSIVE METABOLIC PANEL
ALK PHOS: 66 U/L (ref 39–117)
ALT: 9 U/L (ref 0–35)
ANION GAP: 13 (ref 5–15)
AST: 15 U/L (ref 0–37)
Albumin: 3.5 g/dL (ref 3.5–5.2)
BILIRUBIN TOTAL: 0.3 mg/dL (ref 0.3–1.2)
BUN: 12 mg/dL (ref 6–23)
CHLORIDE: 103 meq/L (ref 96–112)
CO2: 26 meq/L (ref 19–32)
CREATININE: 0.88 mg/dL (ref 0.50–1.10)
Calcium: 9.7 mg/dL (ref 8.4–10.5)
GFR, EST AFRICAN AMERICAN: 88 mL/min — AB (ref >90)
GFR, EST NON AFRICAN AMERICAN: 76 mL/min — AB (ref >90)
GLUCOSE: 103 mg/dL — AB (ref 70–99)
POTASSIUM: 3.9 meq/L (ref 3.7–5.3)
Sodium: 142 mEq/L (ref 137–147)
Total Protein: 7.4 g/dL (ref 6.0–8.3)

## 2013-10-05 LAB — URINALYSIS, ROUTINE W REFLEX MICROSCOPIC
BILIRUBIN URINE: NEGATIVE
Glucose, UA: NEGATIVE mg/dL
HGB URINE DIPSTICK: NEGATIVE
KETONES UR: NEGATIVE mg/dL
Leukocytes, UA: NEGATIVE
Nitrite: NEGATIVE
Protein, ur: NEGATIVE mg/dL
SPECIFIC GRAVITY, URINE: 1.035 — AB (ref 1.005–1.030)
UROBILINOGEN UA: 0.2 mg/dL (ref 0.0–1.0)
pH: 7.5 (ref 5.0–8.0)

## 2013-10-05 LAB — LIPASE, BLOOD: LIPASE: 27 U/L (ref 11–59)

## 2013-10-05 LAB — POC URINE PREG, ED: PREG TEST UR: NEGATIVE

## 2013-10-05 LAB — TROPONIN I: Troponin I: <0.3 ng/mL (ref <0.30)

## 2013-10-05 MED ORDER — IOHEXOL 300 MG/ML  SOLN
100.0000 mL | Freq: Once | INTRAMUSCULAR | Status: AC | PRN
  Administered 2013-10-05: 100 mL via INTRAVENOUS

## 2013-10-05 MED ORDER — MORPHINE SULFATE 2 MG/ML IJ SOLN
2.0000 mg | INTRAMUSCULAR | Status: DC | PRN
  Administered 2013-10-05 (×2): 2 mg via INTRAVENOUS
  Filled 2013-10-05 (×2): qty 1

## 2013-10-05 MED ORDER — ALUM & MAG HYDROXIDE-SIMETH 200-200-20 MG/5ML PO SUSP
30.0000 mL | Freq: Four times a day (QID) | ORAL | Status: DC | PRN
  Administered 2013-10-05: 30 mL via ORAL
  Filled 2013-10-05: qty 30

## 2013-10-05 MED ORDER — ONDANSETRON HCL 4 MG PO TABS: 4.0000 mg | ORAL_TABLET | Freq: Four times a day (QID) | ORAL | Status: DC | PRN

## 2013-10-05 MED ORDER — CIPROFLOXACIN IN D5W 400 MG/200ML IV SOLN
400.0000 mg | Freq: Two times a day (BID) | INTRAVENOUS | Status: DC
  Administered 2013-10-06 – 2013-10-07 (×3): 400 mg via INTRAVENOUS
  Filled 2013-10-05 (×4): qty 200

## 2013-10-05 MED ORDER — HYDROMORPHONE HCL PF 1 MG/ML IJ SOLN
1.0000 mg | Freq: Once | INTRAMUSCULAR | Status: AC
  Administered 2013-10-05: 1 mg via INTRAVENOUS
  Filled 2013-10-05: qty 1

## 2013-10-05 MED ORDER — ACETAMINOPHEN 650 MG RE SUPP: 650.0000 mg | Freq: Four times a day (QID) | RECTAL | Status: DC | PRN

## 2013-10-05 MED ORDER — KCL IN DEXTROSE-NACL 20-5-0.45 MEQ/L-%-% IV SOLN
INTRAVENOUS | Status: DC
  Administered 2013-10-05: 100 mL via INTRAVENOUS
  Administered 2013-10-06 – 2013-10-07 (×3): via INTRAVENOUS
  Filled 2013-10-05 (×6): qty 1000

## 2013-10-05 MED ORDER — HEPARIN SODIUM (PORCINE) 5000 UNIT/ML IJ SOLN
5000.0000 [IU] | Freq: Three times a day (TID) | INTRAMUSCULAR | Status: DC
  Administered 2013-10-05 – 2013-10-07 (×6): 5000 [IU] via SUBCUTANEOUS
  Filled 2013-10-05 (×8): qty 1

## 2013-10-05 MED ORDER — IOHEXOL 300 MG/ML  SOLN
50.0000 mL | Freq: Once | INTRAMUSCULAR | Status: AC | PRN
  Administered 2013-10-05: 50 mL via ORAL

## 2013-10-05 MED ORDER — MORPHINE SULFATE 4 MG/ML IJ SOLN
4.0000 mg | INTRAMUSCULAR | Status: DC | PRN
  Administered 2013-10-05 – 2013-10-07 (×4): 4 mg via INTRAVENOUS
  Filled 2013-10-05 (×4): qty 1

## 2013-10-05 MED ORDER — OXYCODONE HCL 5 MG PO TABS: 5.0000 mg | ORAL_TABLET | ORAL | Status: DC | PRN

## 2013-10-05 MED ORDER — ONDANSETRON HCL 4 MG/2ML IJ SOLN
4.0000 mg | Freq: Once | INTRAMUSCULAR | Status: AC
Start: 2013-10-05 — End: 2013-10-05
  Administered 2013-10-05: 4 mg via INTRAVENOUS
  Filled 2013-10-05: qty 2

## 2013-10-05 MED ORDER — HYOSCYAMINE SULFATE 0.5 MG/ML IJ SOLN
0.2500 mg | Freq: Three times a day (TID) | INTRAMUSCULAR | Status: DC | PRN
  Administered 2013-10-06: 0.25 mg via INTRAVENOUS
  Filled 2013-10-05: qty 0.5

## 2013-10-05 MED ORDER — METRONIDAZOLE IN NACL 5-0.79 MG/ML-% IV SOLN: 500.0000 mg | Freq: Once | INTRAVENOUS | Status: DC

## 2013-10-05 MED ORDER — ONDANSETRON HCL 4 MG/2ML IJ SOLN
4.0000 mg | Freq: Four times a day (QID) | INTRAMUSCULAR | Status: DC | PRN
  Administered 2013-10-06: 4 mg via INTRAVENOUS
  Filled 2013-10-05: qty 2

## 2013-10-05 MED ORDER — ONDANSETRON HCL 4 MG/2ML IJ SOLN
4.0000 mg | Freq: Once | INTRAMUSCULAR | Status: AC
  Administered 2013-10-05: 4 mg via INTRAVENOUS
  Filled 2013-10-05: qty 2

## 2013-10-05 MED ORDER — PANTOPRAZOLE SODIUM 40 MG IV SOLR
40.0000 mg | INTRAVENOUS | Status: DC
  Administered 2013-10-05: 40 mg via INTRAVENOUS
  Filled 2013-10-05 (×2): qty 40

## 2013-10-05 MED ORDER — ALUM & MAG HYDROXIDE-SIMETH 200-200-20 MG/5ML PO SUSP
15.0000 mL | Freq: Once | ORAL | Status: AC
  Administered 2013-10-05: 15 mL via ORAL
  Filled 2013-10-05: qty 30

## 2013-10-05 MED ORDER — CIPROFLOXACIN IN D5W 400 MG/200ML IV SOLN
400.0000 mg | Freq: Once | INTRAVENOUS | Status: AC
  Administered 2013-10-05: 400 mg via INTRAVENOUS
  Filled 2013-10-05: qty 200

## 2013-10-05 MED ORDER — ACETAMINOPHEN 325 MG PO TABS: 650.0000 mg | ORAL_TABLET | Freq: Four times a day (QID) | ORAL | Status: DC | PRN

## 2013-10-05 MED ORDER — METRONIDAZOLE IN NACL 5-0.79 MG/ML-% IV SOLN
500.0000 mg | Freq: Three times a day (TID) | INTRAVENOUS | Status: DC
  Administered 2013-10-05 – 2013-10-07 (×6): 500 mg via INTRAVENOUS
  Filled 2013-10-05 (×7): qty 100

## 2013-10-05 MED ORDER — LIP MEDEX EX OINT
TOPICAL_OINTMENT | CUTANEOUS | Status: AC
  Administered 2013-10-05: 19:00:00
  Filled 2013-10-05: qty 7

## 2013-10-05 NOTE — H&P (Signed)
[facility] History and Physical  Crystal Brennan GBE:[id number] DOB: 17-Nov-1965 DOA: 10/05/2013  Referring physician: Dr. Aline Brochure PCP: PROVIDER NOT IN SYSTEM   Chief Complaint: Acute sigmoid diverticulitis  HPI: Crystal Brennan is a 48 y.o. female with a past medical history of hypertension and history of prior cholecystectomy. Patient given to the hospital because of severe epigastric abdominal pain. Patient reports for the past 2-3 weeks she does have several symptoms including nausea, lower abdominal pain and change from diarrhea to constipation. She thought this is coincide with her changing her diet to high-fiber diet containing a lot vegetables and fruits. But for the past 4 days she developed epigastric abdominal pain, 10/10, the pain is worse with eating, no radiation, sharp, she feels it's gas pain. The pain is associated with nausea and vomiting, whenever she eats. Patient couldn't tolerate it anymore so she came in to the hospital for further evaluation. In the ED CT scan of abdomen/pelvis showed acute sigmoid diverticulitis, interestingly there is no epigastric pathology, history of cholecystectomy but upper abdomen looks okay except hepatic lesions likely benign cysts. Patient admitted to the hospital for further evaluation.   Review of Systems:  Constitutional: negative for anorexia, fevers and sweats Eyes: negative for irritation, redness and visual disturbance Ears, nose, mouth, throat, and face: negative for earaches, epistaxis, nasal congestion and sore throat Respiratory: negative for cough, dyspnea on exertion, sputum and wheezing Cardiovascular: negative for chest pain, dyspnea, lower extremity edema, orthopnea, palpitations and syncope Gastrointestinal: has nausea and vomiting and epigastric abdominal pain Genitourinary:negative for dysuria, frequency and hematuria Hematologic/lymphatic: negative for bleeding, easy bruising and lymphadenopathy Musculoskeletal:negative  for arthralgias, muscle weakness and stiff joints Neurological: negative for coordination problems, gait problems, headaches and weakness Endocrine: negative for diabetic symptoms including polydipsia, polyuria and weight loss Allergic/Immunologic: negative for anaphylaxis, hay fever and urticaria  Past Medical History  Diagnosis Date  . Chronic back pain   . Hypertension   . Depression   . Anxiety   . Anemia   . Menometrorrhagia   . Allergy   . Insomnia    Past Surgical History  Procedure Laterality Date  . Cholecystectomy     Social History:   reports that she has been smoking Cigarettes.  She has been smoking about 0.50 packs per day. She does not have any smokeless tobacco history on file. She reports that she does not drink alcohol or use illicit drugs.  Allergies  Allergen Reactions  . Codeine Nausea And Vomiting    No family history on file.   Prior to Admission medications   Medication Sig Start Date End Date Taking? Authorizing Provider  hydrochlorothiazide (HYDRODIURIL) 25 MG tablet Take 25 mg by mouth daily.   Yes Historical Provider, MD  Simethicone (GAS-X PO) Take 1 capsule by mouth as needed (for gas).   Yes Historical Provider, MD  Simethicone (PHAZYME PO) Take 1 tablet by mouth as needed (for gas).   Yes Historical Provider, MD   Physical Exam: Filed Vitals:   10/05/13 1433  BP: 101/87  Pulse: 65  Temp:   Resp: 20   Constitutional: Oriented to person, place, and time. Well-developed and well-nourished. Cooperative.  Head: Normocephalic and atraumatic.  Nose: Nose normal.  Mouth/Throat: Uvula is midline, oropharynx is clear and moist and mucous membranes are normal.  Eyes: Conjunctivae and EOM are normal. Pupils are equal, round, and reactive to light.  Neck: Trachea normal and normal range of motion. Neck supple.  Cardiovascular: Normal rate, regular rhythm,  S1 normal, S2 normal, normal heart sounds and intact distal pulses.   Pulmonary/Chest: Effort  normal and breath sounds normal.  Abdominal: Soft. Bowel sounds are normal. There is no hepatosplenomegaly. There mild epigastric/LLQ tenderness.  Musculoskeletal: Normal range of motion.  Neurological: Alert and oriented to person, place, and time. Has normal strength. No cranial nerve deficit or sensory deficit.  Skin: Skin is warm, dry and intact.  Psychiatric: Has a normal mood and affect. Speech is normal and behavior is normal.   Labs on Admission:  Basic Metabolic Panel:  Recent Labs Lab 10/05/13 1328  NA 142  K 3.9  CL 103  CO2 26  GLUCOSE 103*  BUN 12  CREATININE 0.88  CALCIUM 9.7   Liver Function Tests:  Recent Labs Lab 10/05/13 1328  AST 15  ALT 9  ALKPHOS 66  BILITOT 0.3  PROT 7.4  ALBUMIN 3.5    Recent Labs Lab 10/05/13 1328  LIPASE 27   No results found for this basename: AMMONIA,  in the last 168 hours CBC:  Recent Labs Lab 10/05/13 1328  WBC 8.7  NEUTROABS 5.8  HGB 13.5  HCT 39.3  MCV 85.8  PLT 318   Cardiac Enzymes: No results found for this basename: CKTOTAL, CKMB, CKMBINDEX, TROPONINI,  in the last 168 hours  BNP (last 3 results) No results found for this basename: PROBNP,  in the last 8760 hours CBG: No results found for this basename: GLUCAP,  in the last 168 hours  Radiological Exams on Admission: Ct Abdomen Pelvis W Contrast  10/05/2013   CLINICAL DATA:  Intermittent upper abdominal pain for 4 days. Intermittent diarrhea. History of cholecystectomy.  EXAM: CT ABDOMEN AND PELVIS WITH CONTRAST  TECHNIQUE: Multidetector CT imaging of the abdomen and pelvis was performed using the standard protocol following bolus administration of intravenous contrast.  CONTRAST:  141mL OMNIPAQUE IOHEXOL 300 MG/ML  SOLN  COMPARISON:  Abdominal ultrasound 12/25/2007.  FINDINGS: Lung bases: Mild dependent bibasilar atelectasis. No significant pleural or pericardial effusion.  Liver/Biliary/Pancreas: There are several low-density hepatic lesions. Some of  these are too small to optimally characterize, but the larger ones are compatible with cysts. Prior cholecystectomy. No biliary dilatation. The pancreas appears normal.  Spleen/Adrenal glands: Unremarkable.  Kidneys/Ureters/Bladder: Both kidneys appear normal. There is no hydronephrosis or evidence of urinary tract calculus. The bladder appears normal.  Bowel/Peritoneum: The stomach and small bowel appear normal. The cecum is in the right mid abdomen. The appendix is not clearly visualized, although there is no pericecal inflammatory change. There are diffuse diverticular changes throughout the colon. The sigmoid colon is quite redundant and demonstrates wall thickening and surrounding inflammation in a portion that extends into the right mid abdomen, inferior to the cecum. There is no evidence of bowel obstruction or extraluminal fluid collection.  Retroperitoneum/Pelvis: Small retroperitoneal lymph nodes are likely reactive. No enlarged lymph nodes are seen. There are no significant vascular findings. 3.3 cm left adnexal low-density lesion on image 62 is likely a cyst. The right ovary and uterus appear unremarkable.  Abdominal wall: No abdominal wall masses or hernias.  Musculoskeletal: No acute or significant osseus findings.  IMPRESSION: 1. Sigmoid diverticulitis without evidence of perforation or bowel obstruction. The sigmoid colon is quite redundant, and the inflamed component is in the right mid abdomen. 2. No evidence of appendicitis. 3. Small low-density liver lesions, likely cysts. 4. Low-density left adnexal lesion, likely a cyst of the left ovary.   Electronically Signed   By: Camie Patience  M.D.   On: 10/05/2013 15:47    EKG: Independently reviewed.   Assessment/Plan Principal Problem:   Sigmoid diverticulitis Active Problems:   HYPERTENSION, BENIGN   Epigastric abdominal pain    Acute sigmoid diverticulitis -Patient presented with abdominal pain, nausea and vomiting, CT scan showed  sigmoid diverticulitis. -Uncomplicated diverticulitis, patient denies fever, no leukocytosis. -Patient primarily admitted because of nausea (cannot tolerate diet/meds) and to control severe abdominal pain. -Patient will be n.p.o. advance diet carefully when pain improves. -Start IV Cipro and Flagyl.  Epigastric abdominal pain -Presented with epigastric and not left lower quadrant abdominal pain.  -As mentioned above CT of abdomen and pelvis did not show any upper abdomen pathology. Cardiac enzymes ordered. -Likely to be referred pain versus colicky abdominal pain, started on morphine and Levsin to control the pain.  Hypertension -On HCTZ, hold, restart when appropriate.  Hepatic lesions -Incidental findings on abdominal CT, per radiology likely benign hepatic cysts  Code Status: Full code Family Communication: Plan discussed with the patient Disposition Plan: Inpatient, MedSurg  Time spent: 59 minutes  [facility] Pager [phone]

## 2013-10-05 NOTE — ED Notes (Signed)
Patient with upper abdominal pain for four days, pain comes and goes and patient refers to it as "gas" which moves from her stomach up into her chest area.  Denies vomiting but has intermittent diarrhea.  She has been taking Gas X, phazyme.  Patient had her gallbladder removed in 2009 and since then has not been able to get rid of the gas on her own.

## 2013-10-05 NOTE — ED Provider Notes (Signed)
CSN: [id number]     Arrival date & time 10/05/13  1241 History   First MD Initiated Contact with Patient 10/05/13 1309     Chief Complaint  Patient presents with  . Abdominal Pain     (Consider location/radiation/quality/duration/timing/severity/associated sxs/prior Treatment) Patient is a 48 y.o. female presenting with abdominal pain.  Abdominal Pain Pain location:  Generalized (worse in epig area) Pain quality: sharp   Pain radiates to:  Back Pain severity:  Moderate Onset quality:  Gradual Duration:  4 days Timing:  Constant Progression:  Worsening Chronicity:  New Context comment:  Does drink 2 glassse of wine daily, pain worse w/ eating Relieved by:  Nothing Worsened by:  Nothing tried Ineffective treatments: gas-x,  Associated symptoms: diarrhea, nausea and vomiting   Associated symptoms: no chest pain, no cough, no dysuria, no fatigue, no fever, no hematuria and no shortness of breath     Past Medical History  Diagnosis Date  . Chronic back pain   . Hypertension   . Depression   . Anxiety   . Anemia   . Menometrorrhagia   . Allergy   . Insomnia    Past Surgical History  Procedure Laterality Date  . Cholecystectomy     No family history on file. History  Substance Use Topics  . Smoking status: Current Every Day Smoker -- 0.50 packs/day    Types: Cigarettes  . Smokeless tobacco: Not on file  . Alcohol Use: No   OB History   Grav Para Term Preterm Abortions TAB SAB Ect Mult Living                 Review of Systems  Constitutional: Negative for fever and fatigue.  HENT: Negative for congestion and drooling.   Eyes: Negative for pain.  Respiratory: Negative for cough and shortness of breath.   Cardiovascular: Negative for chest pain.  Gastrointestinal: Positive for nausea, vomiting, abdominal pain and diarrhea.  Genitourinary: Negative for dysuria and hematuria.  Musculoskeletal: Negative for back pain, gait problem and neck pain.  Skin: Negative  for color change.  Neurological: Negative for dizziness and headaches.  Hematological: Negative for adenopathy.  Psychiatric/Behavioral: Negative for behavioral problems.  All other systems reviewed and are negative.     Allergies  Codeine  Home Medications   Prior to Admission medications   Medication Sig Start Date End Date Taking? Authorizing Provider  hydrochlorothiazide (HYDRODIURIL) 25 MG tablet Take 25 mg by mouth daily.   Yes Historical Provider, MD  meclizine (ANTIVERT) 25 MG tablet Take 1 tablet (25 mg total) by mouth 3 (three) times daily as needed. 12/01/11   Heather Laisure, PA-C   BP 143/80  Pulse 84  Temp(Src) 98.2 F (36.8 C) (Oral)  Resp 18  SpO2 100%  LMP 09/21/2013 Physical Exam  Nursing note and vitals reviewed. Constitutional: She is oriented to person, place, and time. She appears well-developed and well-nourished.  HENT:  Head: Normocephalic.  Mouth/Throat: No oropharyngeal exudate.  Eyes: Conjunctivae and EOM are normal. Pupils are equal, round, and reactive to light.  Neck: Normal range of motion. Neck supple.  Cardiovascular: Normal rate, regular rhythm, normal heart sounds and intact distal pulses.  Exam reveals no gallop and no friction rub.   No murmur heard. Pulmonary/Chest: Effort normal and breath sounds normal. No respiratory distress. She has no wheezes.  Abdominal: Soft. Bowel sounds are normal. There is tenderness (mild diffuse ttp, worse in epig area). There is no rebound and no guarding.  Musculoskeletal: Normal  range of motion. She exhibits no edema and no tenderness.  Neurological: She is alert and oriented to person, place, and time.  Skin: Skin is warm and dry.  Psychiatric: She has a normal mood and affect. Her behavior is normal.    ED Course  Procedures (including critical care time) Labs Review Labs Reviewed  CBC WITH DIFFERENTIAL - Abnormal; Notable for the following:    RDW 17.4 (*)    All other components within normal  limits  COMPREHENSIVE METABOLIC PANEL - Abnormal; Notable for the following:    Glucose, Bld 103 (*)    GFR calc non Af Amer 76 (*)    GFR calc Af Amer 88 (*)    All other components within normal limits  URINALYSIS, ROUTINE W REFLEX MICROSCOPIC - Abnormal; Notable for the following:    Specific Gravity, Urine 1.035 (*)    All other components within normal limits  LIPASE, BLOOD  POC URINE PREG, ED    Imaging Review Ct Abdomen Pelvis W Contrast  10/05/2013   CLINICAL DATA:  Intermittent upper abdominal pain for 4 days. Intermittent diarrhea. History of cholecystectomy.  EXAM: CT ABDOMEN AND PELVIS WITH CONTRAST  TECHNIQUE: Multidetector CT imaging of the abdomen and pelvis was performed using the standard protocol following bolus administration of intravenous contrast.  CONTRAST:  167mL OMNIPAQUE IOHEXOL 300 MG/ML  SOLN  COMPARISON:  Abdominal ultrasound 12/25/2007.  FINDINGS: Lung bases: Mild dependent bibasilar atelectasis. No significant pleural or pericardial effusion.  Liver/Biliary/Pancreas: There are several low-density hepatic lesions. Some of these are too small to optimally characterize, but the larger ones are compatible with cysts. Prior cholecystectomy. No biliary dilatation. The pancreas appears normal.  Spleen/Adrenal glands: Unremarkable.  Kidneys/Ureters/Bladder: Both kidneys appear normal. There is no hydronephrosis or evidence of urinary tract calculus. The bladder appears normal.  Bowel/Peritoneum: The stomach and small bowel appear normal. The cecum is in the right mid abdomen. The appendix is not clearly visualized, although there is no pericecal inflammatory change. There are diffuse diverticular changes throughout the colon. The sigmoid colon is quite redundant and demonstrates wall thickening and surrounding inflammation in a portion that extends into the right mid abdomen, inferior to the cecum. There is no evidence of bowel obstruction or extraluminal fluid collection.   Retroperitoneum/Pelvis: Small retroperitoneal lymph nodes are likely reactive. No enlarged lymph nodes are seen. There are no significant vascular findings. 3.3 cm left adnexal low-density lesion on image 62 is likely a cyst. The right ovary and uterus appear unremarkable.  Abdominal wall: No abdominal wall masses or hernias.  Musculoskeletal: No acute or significant osseus findings.  IMPRESSION: 1. Sigmoid diverticulitis without evidence of perforation or bowel obstruction. The sigmoid colon is quite redundant, and the inflamed component is in the right mid abdomen. 2. No evidence of appendicitis. 3. Small low-density liver lesions, likely cysts. 4. Low-density left adnexal lesion, likely a cyst of the left ovary.   Electronically Signed   By: Camie Patience M.D.   On: 10/05/2013 15:47     EKG Interpretation None      MDM   Final diagnoses:  Diverticulitis of sigmoid colon  Nausea and vomiting, vomiting of unspecified type    1:25 PM 48 y.o. female w hx of HTN, anemia, s/p cholecystectomy who pw nausea, vomiting, diarrhea, and diffuse abdominal pain worse in the epigastric area the last 3 or 4 days. Symptoms seem to be worse with eating. She denies any fevers. She states that she feels like she is bloated.  She is afebrile and vital signs are unremarkable here. Will get screening labs and imaging. A lot of for pain control.  The patient will be admitted to the hospitalist. Any medications given in the ED during this visit are listed below:  Medications  ciprofloxacin (CIPRO) IVPB 400 mg (not administered)  metroNIDAZOLE (FLAGYL) IVPB 500 mg (not administered)  HYDROmorphone (DILAUDID) injection 1 mg (1 mg Intravenous Given 10/05/13 1341)  ondansetron (ZOFRAN) injection 4 mg (4 mg Intravenous Given 10/05/13 1341)  alum & mag hydroxide-simeth (MAALOX/MYLANTA) 200-200-20 MG/5ML suspension 15 mL (15 mLs Oral Given 10/05/13 1340)  iohexol (OMNIPAQUE) 300 MG/ML solution 50 mL (50 mLs Oral Contrast  Given 10/05/13 1343)  HYDROmorphone (DILAUDID) injection 1 mg (1 mg Intravenous Given 10/05/13 1508)  iohexol (OMNIPAQUE) 300 MG/ML solution 100 mL (100 mLs Intravenous Contrast Given 10/05/13 1511)  ondansetron (ZOFRAN) injection 4 mg (4 mg Intravenous Given 10/05/13 1554)      Pamella Pert, MD 10/05/13 1614

## 2013-10-05 NOTE — ED Notes (Signed)
She c/o epigastric/upper abd. Pain plus occasional n/v/d x 4 days.  She states the pain is accentuated by eating.  She does tell me she has hx of cholecystectomy.

## 2013-10-06 LAB — TROPONIN I: Troponin I: <0.3 ng/mL (ref <0.30)

## 2013-10-06 LAB — BASIC METABOLIC PANEL
ANION GAP: 12 (ref 5–15)
BUN: 7 mg/dL (ref 6–23)
CALCIUM: 8.9 mg/dL (ref 8.4–10.5)
CHLORIDE: 102 meq/L (ref 96–112)
CO2: 23 mEq/L (ref 19–32)
CREATININE: 0.83 mg/dL (ref 0.50–1.10)
GFR calc Af Amer: >90 mL/min (ref >90)
GFR calc non Af Amer: 81 mL/min — ABNORMAL LOW (ref >90)
Glucose, Bld: 122 mg/dL — ABNORMAL HIGH (ref 70–99)
Potassium: 3.4 mEq/L — ABNORMAL LOW (ref 3.7–5.3)
Sodium: 137 mEq/L (ref 137–147)

## 2013-10-06 LAB — CBC
HCT: 36.3 % (ref 36.0–46.0)
HEMOGLOBIN: 12.2 g/dL (ref 12.0–15.0)
MCH: 28.5 pg (ref 26.0–34.0)
MCHC: 33.6 g/dL (ref 30.0–36.0)
MCV: 84.8 fL (ref 78.0–100.0)
Platelets: 307 10*3/uL (ref 150–400)
RBC: 4.28 MIL/uL (ref 3.87–5.11)
RDW: 17.2 % — AB (ref 11.5–15.5)
WBC: 7.5 10*3/uL (ref 4.0–10.5)

## 2013-10-06 LAB — TSH: TSH: 1.8 u[IU]/mL (ref 0.350–4.500)

## 2013-10-06 MED ORDER — PANTOPRAZOLE SODIUM 40 MG PO TBEC
40.0000 mg | DELAYED_RELEASE_TABLET | Freq: Every day | ORAL | Status: DC
  Administered 2013-10-06 – 2013-10-07 (×2): 40 mg via ORAL
  Filled 2013-10-06 (×2): qty 1

## 2013-10-06 NOTE — Progress Notes (Signed)
[facility] PROGRESS NOTE  Assessment/Plan: Acute sigmoid diverticulitis with nausea and vomiting:  -  CT scan showed sigmoid diverticulitis.  - Patient primarily admitted because of nausea and pain control. - Will advance diet carefully. - Start IV Cipro and Flagyl.  - cardiac markers negative x 3.  Hypertension  - held HCTZ  Hepatic lesions  -Incidental findings on abdominal CT, per radiology likely benign hepatic cysts       Code Status: Full code  Family Communication: Plan discussed with the patient  Disposition Plan: Inpatient, [place]    Consultants:  none  Procedures:  CT abd and pelvis  Antibiotics:  cipro and flagyl  HPI/Subjective: Nausea improved.  Objective: Filed Vitals:   10/05/13 1746 10/05/13 1752 10/05/13 2139 10/06/13 0555  BP: 134/88  143/75 101/56  Pulse: 74  69 77  Temp: 97.5 F (36.4 C)  98.5 F (36.9 C) 98.7 F (37.1 C)  TempSrc: Oral  Oral Oral  Resp:   18 18  Height:  5\' 2"  (1.575 m)    Weight:  106.595 kg (235 lb)    SpO2: 98%  97% 98%    Intake/Output Summary (Last 24 hours) at 10/06/13 1036 Last data filed at 10/06/13 1000  Gross per 24 hour  Intake   1515 ml  Output   1250 ml  Net    265 ml   Filed Weights   10/05/13 1752  Weight: 106.595 kg (235 lb)    Exam:  General: Alert, awake, oriented x3, in no acute distress.  HEENT: No bruits, no goiter.  Heart: Regular rate and rhythm. Lungs: Good air movement, clear Abdomen: Soft, nontender, nondistended, positive bowel sounds.    Data Reviewed: Basic Metabolic Panel:  Recent Labs Lab 10/05/13 1328 10/06/13 0540  NA 142 137  K 3.9 3.4*  CL 103 102  CO2 26 23  GLUCOSE 103* 122*  BUN 12 7  CREATININE 0.88 0.83  CALCIUM 9.7 8.9   Liver Function Tests:  Recent Labs Lab 10/05/13 1328  AST 15  ALT 9  ALKPHOS 66  BILITOT 0.3  PROT 7.4  ALBUMIN 3.5    Recent Labs Lab 10/05/13 1328  LIPASE 27   No results found for this basename:  AMMONIA,  in the last 168 hours CBC:  Recent Labs Lab 10/05/13 1328 10/06/13 0540  WBC 8.7 7.5  NEUTROABS 5.8  --   HGB 13.5 12.2  HCT 39.3 36.3  MCV 85.8 84.8  PLT 318 307   Cardiac Enzymes:  Recent Labs Lab 10/05/13 1805 10/05/13 2350 10/06/13 0540  TROPONINI <0.30 <0.30 <0.30   BNP (last 3 results) No results found for this basename: PROBNP,  in the last 8760 hours CBG: No results found for this basename: GLUCAP,  in the last 168 hours  No results found for this or any previous visit (from the past 240 hour(s)).   Studies: Ct Abdomen Pelvis W Contrast  10/05/2013   CLINICAL DATA:  Intermittent upper abdominal pain for 4 days. Intermittent diarrhea. History of cholecystectomy.  EXAM: CT ABDOMEN AND PELVIS WITH CONTRAST  TECHNIQUE: Multidetector CT imaging of the abdomen and pelvis was performed using the standard protocol following bolus administration of intravenous contrast.  CONTRAST:  156mL OMNIPAQUE IOHEXOL 300 MG/ML  SOLN  COMPARISON:  Abdominal ultrasound 12/25/2007.  FINDINGS: Lung bases: Mild dependent bibasilar atelectasis. No significant pleural or pericardial effusion.  Liver/Biliary/Pancreas: There are several low-density hepatic lesions. Some of these are too small to optimally characterize, but the  larger ones are compatible with cysts. Prior cholecystectomy. No biliary dilatation. The pancreas appears normal.  Spleen/Adrenal glands: Unremarkable.  Kidneys/Ureters/Bladder: Both kidneys appear normal. There is no hydronephrosis or evidence of urinary tract calculus. The bladder appears normal.  Bowel/Peritoneum: The stomach and small bowel appear normal. The cecum is in the right mid abdomen. The appendix is not clearly visualized, although there is no pericecal inflammatory change. There are diffuse diverticular changes throughout the colon. The sigmoid colon is quite redundant and demonstrates wall thickening and surrounding inflammation in a portion that extends  into the right mid abdomen, inferior to the cecum. There is no evidence of bowel obstruction or extraluminal fluid collection.  Retroperitoneum/Pelvis: Small retroperitoneal lymph nodes are likely reactive. No enlarged lymph nodes are seen. There are no significant vascular findings. 3.3 cm left adnexal low-density lesion on image 62 is likely a cyst. The right ovary and uterus appear unremarkable.  Abdominal wall: No abdominal wall masses or hernias.  Musculoskeletal: No acute or significant osseus findings.  IMPRESSION: 1. Sigmoid diverticulitis without evidence of perforation or bowel obstruction. The sigmoid colon is quite redundant, and the inflamed component is in the right mid abdomen. 2. No evidence of appendicitis. 3. Small low-density liver lesions, likely cysts. 4. Low-density left adnexal lesion, likely a cyst of the left ovary.   Electronically Signed   By: Camie Patience M.D.   On: 10/05/2013 15:47    Scheduled Meds: . ciprofloxacin  400 mg Intravenous Q12H  . heparin  5,000 Units Subcutaneous 3 times per day  . metronidazole  500 mg Intravenous Q8H  . pantoprazole (PROTONIX) IV  40 mg Intravenous Q24H   Continuous Infusions: . dextrose 5 % and 0.45 % NaCl with KCl 20 mEq/L 100 mL/hr at 10/06/13 Durand, ABRAHAM  [facility] Pager [phone]. If 8PM-8AM, please contact night-coverage at [email or web address], password [facility] 10/06/2013, 10:36 AM  LOS: 1 day      **Disclaimer: This note may have been dictated with voice recognition software. Similar sounding words can inadvertently be transcribed and this note may contain transcription errors which may not have been corrected upon publication of note.**

## 2013-10-06 NOTE — Care Management Note (Signed)
    Page 1 of 1   10/06/2013     10:55:59 AM CARE MANAGEMENT NOTE 10/06/2013  Patient:  JULIO, STORR   Account Number:  [id number]  Date Initiated:  10/06/2013  Documentation initiated by:  Sunday Spillers  Subjective/Objective Assessment:   48 yo female admitted with diverticulitis. PTA lived at home.     Action/Plan:   Home when stable   Anticipated DC Date:  10/10/2013   Anticipated DC Plan:  [place]  CM consult      Choice offered to / List presented to:             Status of service:  Completed, signed off Medicare Important Message given?   (If response is "NO", the following Medicare IM given date fields will be blank) Date Medicare IM given:   Medicare IM given by:   Date Additional Medicare IM given:   Additional Medicare IM given by:    Discharge Disposition:  HOME/SELF CARE  Per UR Regulation:  Reviewed for med. necessity/level of care/duration of stay  If discussed at [place] of Stay Meetings, dates discussed:    Comments:

## 2013-10-07 MED ORDER — METRONIDAZOLE 500 MG PO TABS: 500.0000 mg | ORAL_TABLET | Freq: Three times a day (TID) | ORAL | Status: DC

## 2013-10-07 MED ORDER — OXYCODONE HCL 5 MG PO TABS: 5.0000 mg | ORAL_TABLET | ORAL | Status: DC | PRN

## 2013-10-07 MED ORDER — CIPROFLOXACIN HCL 500 MG PO TABS: 500.0000 mg | ORAL_TABLET | Freq: Two times a day (BID) | ORAL | Status: DC

## 2013-10-07 MED ORDER — POTASSIUM CHLORIDE CRYS ER 20 MEQ PO TBCR
40.0000 meq | EXTENDED_RELEASE_TABLET | Freq: Two times a day (BID) | ORAL | Status: DC
  Administered 2013-10-07: 40 meq via ORAL
  Filled 2013-10-07 (×3): qty 2

## 2013-10-07 NOTE — Discharge Summary (Signed)
Physician Discharge Summary  Crystal Brennan KXF:[id number] DOB: 02-05-1966 DOA: 10/05/2013  PCP: PROVIDER NOT IN SYSTEM  Admit date: 10/05/2013 Discharge date: 10/07/2013  Time spent: 35 minutes  Recommendations for Outpatient Follow-up:  1. Follow up with PCP  Discharge Diagnoses:  Principal Problem:   Sigmoid diverticulitis Active Problems:   HYPERTENSION, BENIGN   Epigastric abdominal pain   Discharge Condition: stable  Diet recommendation: regular  Filed Weights   10/05/13 1752  Weight: 106.595 kg (235 lb)    History of present illness:  47 y.o. female with a past medical history of hypertension and history of prior cholecystectomy. Patient given to the hospital because of severe epigastric abdominal pain. Patient reports for the past 2-3 weeks she does have several symptoms including nausea, lower abdominal pain and change from diarrhea to constipation. She thought this is coincide with her changing her diet to high-fiber diet containing a lot vegetables and fruits. But for the past 4 days she developed epigastric abdominal pain, 10/10, the pain is worse with eating, no radiation, sharp, she feels it's gas pain. The pain is associated with nausea and vomiting, whenever she eats.    Hospital Course:  Acute sigmoid diverticulitis with nausea and vomiting:  - CT scan showed sigmoid diverticulitis.  - Patient primarily admitted because of nausea and pain control.  - treated with cipro and flagyl with improvement on the symptoms. - change to oral diet advance.  Hypertension  - held HCTZ, resume as an outpatinet.  Hepatic lesions  -Incidental findings on abdominal CT, per radiology likely benign hepatic cysts   Procedures:  CVT abd and pelvis  Consultations:  noen  Discharge Exam: Filed Vitals:   10/07/13 1012  BP: 131/63  Pulse: 63  Temp: 98.8 F (37.1 C)  Resp:     General: A&O x3 Cardiovascular: RRR Respiratory: good air movement CTA B/L  Discharge  Instructions You were cared for by a hospitalist during your hospital stay. If you have any questions about your discharge medications or the care you received while you were in the hospital after you are discharged, you can call the unit and asked to speak with the hospitalist on call if the hospitalist that took care of you is not available. Once you are discharged, your primary care physician will handle any further medical issues. Please note that NO REFILLS for any discharge medications will be authorized once you are discharged, as it is imperative that you return to your primary care physician (or establish a relationship with a primary care physician if you do not have one) for your aftercare needs so that they can reassess your need for medications and monitor your lab values.      Discharge Instructions   Diet - low sodium heart healthy    Complete by:  As directed      Increase activity slowly    Complete by:  As directed             Medication List         ciprofloxacin 500 MG tablet  Commonly known as:  CIPRO  Take 1 tablet (500 mg total) by mouth 2 (two) times daily.     GAS-X PO  Take 1 capsule by mouth as needed (for gas).     PHAZYME PO  Take 1 tablet by mouth as needed (for gas).     hydrochlorothiazide 25 MG tablet  Commonly known as:  HYDRODIURIL  Take 25 mg by mouth daily.  metroNIDAZOLE 500 MG tablet  Commonly known as:  FLAGYL  Take 1 tablet (500 mg total) by mouth 3 (three) times daily.     oxyCODONE 5 MG immediate release tablet  Commonly known as:  Oxy IR/ROXICODONE  Take 1 tablet (5 mg total) by mouth every 4 (four) hours as needed for moderate pain.       Allergies  Allergen Reactions  . Benadryl [Diphenhydramine Hcl] Hives and Itching  . Codeine Nausea And Vomiting  . Percocet [Oxycodone-Acetaminophen] Hives and Itching      The results of significant diagnostics from this hospitalization (including imaging, microbiology, ancillary and  laboratory) are listed below for reference.    Significant Diagnostic Studies: Ct Abdomen Pelvis W Contrast  10/05/2013   CLINICAL DATA:  Intermittent upper abdominal pain for 4 days. Intermittent diarrhea. History of cholecystectomy.  EXAM: CT ABDOMEN AND PELVIS WITH CONTRAST  TECHNIQUE: Multidetector CT imaging of the abdomen and pelvis was performed using the standard protocol following bolus administration of intravenous contrast.  CONTRAST:  142mL OMNIPAQUE IOHEXOL 300 MG/ML  SOLN  COMPARISON:  Abdominal ultrasound 12/25/2007.  FINDINGS: Lung bases: Mild dependent bibasilar atelectasis. No significant pleural or pericardial effusion.  Liver/Biliary/Pancreas: There are several low-density hepatic lesions. Some of these are too small to optimally characterize, but the larger ones are compatible with cysts. Prior cholecystectomy. No biliary dilatation. The pancreas appears normal.  Spleen/Adrenal glands: Unremarkable.  Kidneys/Ureters/Bladder: Both kidneys appear normal. There is no hydronephrosis or evidence of urinary tract calculus. The bladder appears normal.  Bowel/Peritoneum: The stomach and small bowel appear normal. The cecum is in the right mid abdomen. The appendix is not clearly visualized, although there is no pericecal inflammatory change. There are diffuse diverticular changes throughout the colon. The sigmoid colon is quite redundant and demonstrates wall thickening and surrounding inflammation in a portion that extends into the right mid abdomen, inferior to the cecum. There is no evidence of bowel obstruction or extraluminal fluid collection.  Retroperitoneum/Pelvis: Small retroperitoneal lymph nodes are likely reactive. No enlarged lymph nodes are seen. There are no significant vascular findings. 3.3 cm left adnexal low-density lesion on image 62 is likely a cyst. The right ovary and uterus appear unremarkable.  Abdominal wall: No abdominal wall masses or hernias.  Musculoskeletal: No acute  or significant osseus findings.  IMPRESSION: 1. Sigmoid diverticulitis without evidence of perforation or bowel obstruction. The sigmoid colon is quite redundant, and the inflamed component is in the right mid abdomen. 2. No evidence of appendicitis. 3. Small low-density liver lesions, likely cysts. 4. Low-density left adnexal lesion, likely a cyst of the left ovary.   Electronically Signed   By: Camie Patience M.D.   On: 10/05/2013 15:47    Microbiology: No results found for this or any previous visit (from the past 240 hour(s)).   Labs: Basic Metabolic Panel:  Recent Labs Lab 10/05/13 1328 10/06/13 0540  NA 142 137  K 3.9 3.4*  CL 103 102  CO2 26 23  GLUCOSE 103* 122*  BUN 12 7  CREATININE 0.88 0.83  CALCIUM 9.7 8.9   Liver Function Tests:  Recent Labs Lab 10/05/13 1328  AST 15  ALT 9  ALKPHOS 66  BILITOT 0.3  PROT 7.4  ALBUMIN 3.5    Recent Labs Lab 10/05/13 1328  LIPASE 27   No results found for this basename: AMMONIA,  in the last 168 hours CBC:  Recent Labs Lab 10/05/13 1328 10/06/13 0540  WBC 8.7 7.5  NEUTROABS 5.8  --  HGB 13.5 12.2  HCT 39.3 36.3  MCV 85.8 84.8  PLT 318 307   Cardiac Enzymes:  Recent Labs Lab 10/05/13 1805 10/05/13 2350 10/06/13 0540  TROPONINI <0.30 <0.30 <0.30   BNP: BNP (last 3 results) No results found for this basename: PROBNP,  in the last 8760 hours CBG: No results found for this basename: GLUCAP,  in the last 168 hours     Signed:  Charlynne Cousins  [facility] 10/07/2013, 11:35 AM

## 2013-10-07 NOTE — Progress Notes (Signed)
Patient is alert and oriented, vital signs are stable, patient is tolerating her diet without complaints of nausea or vomiting, discharge instructions reviewed with patient, prescriptions given Neta Mends RN 10-07-2013 16:29pm

## 2014-01-01 ENCOUNTER — Encounter (HOSPITAL_COMMUNITY): Payer: Self-pay | Admitting: *Deleted

## 2014-01-01 ENCOUNTER — Inpatient Hospital Stay (HOSPITAL_COMMUNITY)
Admission: EM | Admit: 2014-01-01 | Discharge: 2014-01-02 | DRG: 309 | Disposition: A | Discharge status: Home / Self Care | Payer: Self-pay | Attending: Cardiology | Admitting: Cardiology

## 2014-01-01 DIAGNOSIS — Z7901 Long term (current) use of anticoagulants: Secondary | ICD-10-CM

## 2014-01-01 DIAGNOSIS — F419 Anxiety disorder, unspecified: Secondary | ICD-10-CM | POA: Insufficient documentation

## 2014-01-01 DIAGNOSIS — E785 Hyperlipidemia, unspecified: Secondary | ICD-10-CM | POA: Diagnosis present

## 2014-01-01 DIAGNOSIS — G4700 Insomnia, unspecified: Secondary | ICD-10-CM | POA: Diagnosis present

## 2014-01-01 DIAGNOSIS — Z8249 Family history of ischemic heart disease and other diseases of the circulatory system: Secondary | ICD-10-CM

## 2014-01-01 DIAGNOSIS — G8929 Other chronic pain: Secondary | ICD-10-CM | POA: Diagnosis present

## 2014-01-01 DIAGNOSIS — I10 Essential (primary) hypertension: Secondary | ICD-10-CM

## 2014-01-01 DIAGNOSIS — D573 Sickle-cell trait: Secondary | ICD-10-CM | POA: Diagnosis present

## 2014-01-01 DIAGNOSIS — F17210 Nicotine dependence, cigarettes, uncomplicated: Secondary | ICD-10-CM | POA: Diagnosis present

## 2014-01-01 DIAGNOSIS — M549 Dorsalgia, unspecified: Secondary | ICD-10-CM | POA: Diagnosis present

## 2014-01-01 DIAGNOSIS — Z885 Allergy status to narcotic agent status: Secondary | ICD-10-CM

## 2014-01-01 DIAGNOSIS — E669 Obesity, unspecified: Secondary | ICD-10-CM

## 2014-01-01 DIAGNOSIS — Z6841 Body Mass Index (BMI) 40.0 and over, adult: Secondary | ICD-10-CM

## 2014-01-01 DIAGNOSIS — R079 Chest pain, unspecified: Secondary | ICD-10-CM

## 2014-01-01 DIAGNOSIS — F329 Major depressive disorder, single episode, unspecified: Secondary | ICD-10-CM | POA: Diagnosis present

## 2014-01-01 DIAGNOSIS — I4891 Unspecified atrial fibrillation: Principal | ICD-10-CM

## 2014-01-01 DIAGNOSIS — I480 Paroxysmal atrial fibrillation: Secondary | ICD-10-CM | POA: Diagnosis present

## 2014-01-01 HISTORY — DX: Sickle-cell trait: D57.3

## 2014-01-01 HISTORY — DX: Family history of other specified conditions: Z84.89

## 2014-01-01 HISTORY — DX: Diverticulitis of intestine, part unspecified, without perforation or abscess without bleeding: K57.92

## 2014-01-01 HISTORY — DX: Unspecified osteoarthritis, unspecified site: M19.90

## 2014-01-01 LAB — BASIC METABOLIC PANEL
Anion gap: 15 (ref 5–15)
BUN: 9 mg/dL (ref 6–23)
CO2: 22 mEq/L (ref 19–32)
Calcium: 9.5 mg/dL (ref 8.4–10.5)
Chloride: 104 mEq/L (ref 96–112)
Creatinine, Ser: 0.7 mg/dL (ref 0.50–1.10)
Glucose, Bld: 115 mg/dL — ABNORMAL HIGH (ref 70–99)
POTASSIUM: 3.8 meq/L (ref 3.7–5.3)
Sodium: 141 mEq/L (ref 137–147)

## 2014-01-01 LAB — CBC
HCT: 39.9 % (ref 36.0–46.0)
Hemoglobin: 14 g/dL (ref 12.0–15.0)
MCH: 30.3 pg (ref 26.0–34.0)
MCHC: 35.1 g/dL (ref 30.0–36.0)
MCV: 86.4 fL (ref 78.0–100.0)
PLATELETS: 385 10*3/uL (ref 150–400)
RBC: 4.62 MIL/uL (ref 3.87–5.11)
RDW: 15.8 % — AB (ref 11.5–15.5)
WBC: 7 10*3/uL (ref 4.0–10.5)

## 2014-01-01 LAB — TROPONIN I

## 2014-01-01 LAB — I-STAT TROPONIN, ED: TROPONIN I, POC: 0 ng/mL (ref 0.00–0.08)

## 2014-01-01 LAB — MAGNESIUM: Magnesium: 2.3 mg/dL (ref 1.5–2.5)

## 2014-01-01 LAB — POC URINE PREG, ED: Preg Test, Ur: NEGATIVE

## 2014-01-01 LAB — TSH: TSH: 1.03 u[IU]/mL (ref 0.350–4.500)

## 2014-01-01 MED ORDER — IBUPROFEN 800 MG PO TABS
800.0000 mg | ORAL_TABLET | Freq: Once | ORAL | Status: AC
  Administered 2014-01-01: 800 mg via ORAL
  Filled 2014-01-01: qty 1

## 2014-01-01 MED ORDER — DEXTROSE 5 % IV SOLN
5.0000 mg/h | INTRAVENOUS | Status: AC
  Administered 2014-01-01: 5 mg/h via INTRAVENOUS
  Filled 2014-01-01: qty 100

## 2014-01-01 MED ORDER — SODIUM CHLORIDE 0.9 % IV SOLN: 250.0000 mL | INTRAVENOUS | Status: DC

## 2014-01-01 MED ORDER — APIXABAN 5 MG PO TABS
5.0000 mg | ORAL_TABLET | Freq: Two times a day (BID) | ORAL | Status: DC
  Administered 2014-01-01 – 2014-01-02 (×2): 5 mg via ORAL
  Filled 2014-01-01 (×2): qty 1

## 2014-01-01 MED ORDER — SODIUM CHLORIDE 0.9 % IJ SOLN: 3.0000 mL | INTRAMUSCULAR | Status: DC | PRN

## 2014-01-01 MED ORDER — ONDANSETRON HCL 4 MG/2ML IJ SOLN: 4.0000 mg | Freq: Four times a day (QID) | INTRAMUSCULAR | Status: DC | PRN

## 2014-01-01 MED ORDER — HYDROCHLOROTHIAZIDE 25 MG PO TABS
25.0000 mg | ORAL_TABLET | Freq: Every day | ORAL | Status: DC
  Administered 2014-01-02: 25 mg via ORAL
  Filled 2014-01-01: qty 1

## 2014-01-01 MED ORDER — SODIUM CHLORIDE 0.9 % IJ SOLN: 3.0000 mL | Freq: Two times a day (BID) | INTRAMUSCULAR | Status: DC

## 2014-01-01 MED ORDER — SODIUM CHLORIDE 0.9 % IJ SOLN
3.0000 mL | Freq: Two times a day (BID) | INTRAMUSCULAR | Status: DC
Start: 2014-01-01 — End: 2014-01-02

## 2014-01-01 MED ORDER — ONDANSETRON HCL 4 MG/2ML IJ SOLN
4.0000 mg | Freq: Three times a day (TID) | INTRAMUSCULAR | Status: DC | PRN
  Administered 2014-01-01: 4 mg via INTRAVENOUS
  Filled 2014-01-01: qty 2

## 2014-01-01 MED ORDER — DILTIAZEM HCL 100 MG IV SOLR
5.0000 mg/h | Freq: Once | INTRAVENOUS | Status: AC
  Administered 2014-01-01: 5 mg/h via INTRAVENOUS

## 2014-01-01 MED ORDER — DILTIAZEM HCL 25 MG/5ML IV SOLN
20.0000 mg | Freq: Once | INTRAVENOUS | Status: AC
  Administered 2014-01-01: 20 mg via INTRAVENOUS

## 2014-01-01 MED ORDER — SODIUM CHLORIDE 0.9 % IV SOLN: 250.0000 mL | INTRAVENOUS | Status: DC | PRN

## 2014-01-01 MED ORDER — IBUPROFEN 600 MG PO TABS
600.0000 mg | ORAL_TABLET | Freq: Four times a day (QID) | ORAL | Status: DC | PRN
  Administered 2014-01-01 – 2014-01-02 (×2): 600 mg via ORAL
  Filled 2014-01-01 (×2): qty 1

## 2014-01-01 NOTE — H&P (Signed)
Patient ID: Crystal Brennan MRN: [id number], DOB/AGE: 1965-11-13   Admit date: 01/01/2014  Primary Physician: PROVIDER NOT IN SYSTEM Primary Cardiologist: B. Stanford Breed, MD   Pt. Profile:  48 y/o female w/a h/o HTN and cardiomegaly who presented to the ED today with palpitations and was found to be in afib with RVR.   Problem List  Past Medical History  Diagnosis Date  . Chronic back pain   . Hypertension   . Depression   . Anxiety   . Anemia   . Menometrorrhagia   . Insomnia   . Diverticulitis   . Sickle cell trait   . Cardiomegaly     a. 2011 Echo: EF 65-70%, Gr 1 DD, nl wall motion.  . Morbid obesity   . Tobacco abuse   . Palpitations     a. Date back to 08/2013.  . Diverticulitis     a. 09/2013    Past Surgical History  Procedure Laterality Date  . Cholecystectomy      Allergies  Allergies  Allergen Reactions  . Benadryl [Diphenhydramine Hcl] Hives and Itching  . Codeine Nausea And Vomiting  . Dilaudid [Hydromorphone Hcl]   . Percocet [Oxycodone-Acetaminophen] Hives and Itching   HPI  48 y/o female with a prior h/o HTN.  She was last seen in our clinic in 2011 in the setting of HTN and cardiomegaly.  Echo was done and showed nl LV fxn.  Over the years, she has done reasonably well though beginning in July, she began to experience intermittent palpitations occurring a few x/wk w/o associated Ss, lasting about 5 mins, and resolving spontaneously.  She was admitted to [facility] in August with diverticulitis.  She says that she had frequent palpitations during that hospitalization but apparently was not on a monitor and never had an ECG during palps.  Since then, she has noted increased frequency of palpitations, especially over the last month, and especially during walks.  Ss are now occurring on a daily basis and are assoc with dyspnea, occas lightheadedness, and on a few occasions, chest discomfort.  Earlier this week, she had persistent palps prior to going to bed but was  eventually able to fall asleep.  This AM, she awoke with tachypalps and profound weakness.  She was sob and lightheaded as well.  She went on to work with persistent Ss and by 10:30, she decided to seek care.  She came into the ED and was found to be in afib with RVR @ 135.  Labs were unrevealing.  She was placed on dilt gtt @ 5mg /hr with improved HR into the high 90's to low 100's.  Home Medications  Prior to Admission medications   Medication Sig Start Date End Date Taking? Authorizing Provider  hydrochlorothiazide (HYDRODIURIL) 25 MG tablet Take 25 mg by mouth daily.   Yes Historical Provider, MD    Family History  Family History  Problem Relation Age of Onset  . Heart attack Mother     ? in her 46's - never sought treatment  . Pulmonary embolism Mother   . Other Father     ? hx  . Lupus Sister   . Heart failure Sister     Social History  History   Social History  . Marital Status: Single    Spouse Name: N/A    Number of Children: N/A  . Years of Education: N/A   Occupational History  . Not on file.   Social History Main Topics  .  Smoking status: Current Every Day Smoker -- 0.33 packs/day for 30 years    Types: Cigarettes  . Smokeless tobacco: Not on file  . Alcohol Use: 0.0 oz/week    0 Not specified per week     Comment: 1 glass of wine 3x/wk  . Drug Use: No  . Sexual Activity: Not on file   Other Topics Concern  . Not on file   Social History Narrative   Lives in [place] with fiance.  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  Walks regularly.  Trying to lose weight.     Review of Systems General:  No chills, fever, night sweats or weight changes.  Cardiovascular:  +++ occas rest and exertional chest pain,  +++ dyspnea on exertion, +++ LE edema - esp @ the end of the day, no orthopnea, +++ palpitations, no paroxysmal nocturnal dyspnea. Dermatological: No rash, lesions/masses Respiratory: No cough, +++ dyspnea Urologic: No hematuria,  dysuria Abdominal:   No nausea, vomiting, diarrhea, bright red blood per rectum, melena, or hematemesis Neurologic:  No visual changes, +++wkns this AM.  No changes in mental status. All other systems reviewed and are otherwise negative except as noted above.  Physical Exam  Blood pressure 133/94, pulse 147, temperature 98.5 F (36.9 C), temperature source Oral, resp. rate 22, weight 239 lb (108.41 kg), last menstrual period 12/31/2013, SpO2 100 %.  General: Pleasant, NAD Psych: Normal affect. Neuro: Alert and oriented X 3. Moves all extremities spontaneously. HEENT: Normal  Neck: Supple without bruits.  Difficult to assess jvp 2/2 girth. Lungs:  Resp regular and unlabored, CTA. Heart: IR, IR, tachy, no s3, s4, or murmurs. Abdomen: Soft, non-tender, non-distended, BS + x 4.  Extremities: No clubbing, cyanosis or edema. DP/PT/Radials 2+ and equal bilaterally.  Labs  Troponin [facility] of Care Test)  Recent Labs  01/01/14 1210  TROPIPOC 0.00   Lab Results  Component Value Date   WBC 7.0 01/01/2014   HGB 14.0 01/01/2014   HCT 39.9 01/01/2014   MCV 86.4 01/01/2014   PLT 385 01/01/2014     Recent Labs Lab 01/01/14 1200  NA 141  K 3.8  CL 104  CO2 22  BUN 9  CREATININE 0.70  CALCIUM 9.5  GLUCOSE 115*   Lab Results  Component Value Date   CHOL 198 08/18/2009   HDL 51 08/18/2009   LDLCALC 127* 08/18/2009   TRIG 102 08/18/2009    Radiology/Studies  No results found.  ECG  Afib, 135, non-specific st/t changes.  ASSESSMENT AND PLAN  1.  Afib RVR:  Pt has a h/o intermittent palpitations dating back to early summer and these have progressed over the past month.  She awoke this AM with tachypalpitations associated with wkns, dyspnea, and lightheadedness.  This persisted all morning and she was found to be in afib with RVR upon arrival to the ED.  Rate is currently better in the 90's to low 100's on IV dilt @ 5mg /hr.  She currently feels better.  CHA2DS2VASc = 2  (htn/gender).  Labs ok.  Will admit, cycle CE, check TSH/Mg/Echo.  Initiate eliquis 5mg  bid.  If she does not convert by the AM, will plan DCCV in the AM.  Plan f/u with Afib clinic following d/c.  2.  HTN:  Follow on IV dilt.  Cont diuretic.  3.  HL:  LDL 127 in 2011.  Repeat lipids/lft's.  4.  Midsternal chest pain:  Pt reports intermittent midsternal chest pain occurring with both rest  and exertion.  Will plan outpt myoview if CE remain nl.  5.  Morbid Obesity:  Trying to lose weight.  Likely needs outpt sleep eval.  6.  Tob Abuse:  Cessation advised.  Says she wants to quit.  Signed, Murray Hodgkins, NP 01/01/2014, 2:53 PM

## 2014-01-01 NOTE — ED Notes (Signed)
Cardiology at the bedside.

## 2014-01-01 NOTE — ED Notes (Signed)
Resident at the bedside

## 2014-01-01 NOTE — ED Provider Notes (Signed)
CSN: [id number]     Arrival date & time 01/01/14  1137 History   First MD Initiated Contact with Patient 01/01/14 1202     Chief Complaint  Patient presents with  . Palpitations     HPI : Crystal Brennan is a 48 year old African American female with PMH of HTN and Diverticulitis who presents to the ED on 01/01/2014 for palpitations that have been present consistently since this morning. Reports experiencing palpitations off and on over the past year and says they last 30 seconds to 2 minutes. Had Diverticulitis in August and says the palpitations have increased in frequency since then. This morning she began having the palpitations before going to work and says they persisted during her first few hours at work, and this caused her to come to the ED. She denies any chest pain, shortness of breath, vomiting, diaphoresis, vision changes, dizziness, or syncope. She does report having nausea at baseline and says that is unchanged since her cholecystectomy "many years ago".   She reports good compliance with her BP medications but says she does not have a PCP. She says her last prescriptions were from her hospitalization in August for Diverticulitis. 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 Says her mother and sister have an arrhythmia that she believes is Atrial Fibrillation.   Reports smoking half a pack a day for 29 years. Endorses drinking 1 glass of wine per day. Denies recreational drug use.  Past Medical History  Diagnosis Date  . Chronic back pain   . Hypertension   . Depression   . Anxiety   . Anemia   . Menometrorrhagia   . Allergy   . Insomnia   . Diverticulitis    Past Surgical History  Procedure Laterality Date  . Cholecystectomy     No family history on file. History  Substance Use Topics  . Smoking status: Current Every Day Smoker -- 0.50 packs/day    Types: Cigarettes  . Smokeless tobacco: Not on  file  . Alcohol Use: Yes     Comment: 1 glass of wine daily.   OB History    No data available     Review of Systems: All other systems negative except as documented in the HPI. All pertinent positives and negatives as reviewed in the HPI.     Allergies  Benadryl; Codeine; Dilaudid; and Percocet  Home Medications   Prior to Admission medications   Medication Sig Start Date End Date Taking? Authorizing Provider  ciprofloxacin (CIPRO) 500 MG tablet Take 1 tablet (500 mg total) by mouth 2 (two) times daily. 10/07/13   Charlynne Cousins, MD  hydrochlorothiazide (HYDRODIURIL) 25 MG tablet Take 25 mg by mouth daily.    Historical Provider, MD  metroNIDAZOLE (FLAGYL) 500 MG tablet Take 1 tablet (500 mg total) by mouth 3 (three) times daily. 10/07/13   Charlynne Cousins, MD  oxyCODONE (OXY IR/ROXICODONE) 5 MG immediate release tablet Take 1 tablet (5 mg total) by mouth every 4 (four) hours as needed for moderate pain. 10/07/13   Charlynne Cousins, MD  Simethicone (GAS-X PO) Take 1 capsule by mouth as needed (for gas).    Historical Provider, MD  Simethicone (PHAZYME PO) Take 1 tablet by mouth as needed (for gas).    Historical Provider, MD   BP 130/102 mmHg  Pulse 131  Temp(Src) 98.5 F (36.9 C) (Oral)  Resp 18  Wt 239 lb (108.41 kg)  SpO2 100%  LMP 12/31/2013 Physical Exam  Constitutional: She is oriented to person, place, and time. She appears well-developed and well-nourished. No distress.  HENT:  Head: Normocephalic and atraumatic.  Eyes: Conjunctivae and EOM are normal. Pupils are equal, round, and reactive to light.  Neck: Normal range of motion. Neck supple. No JVD present.  Cardiovascular: Intact distal pulses.  Exam reveals no gallop and no friction rub.   No murmur heard. Irregularly irregular rhythm.  Pulmonary/Chest: Effort normal and breath sounds normal. No stridor. No respiratory distress. She has no wheezes. She has no rales. She exhibits no tenderness.   Abdominal: Soft. Bowel sounds are normal. She exhibits no distension and no mass. There is no tenderness. There is no rebound and no guarding.  Musculoskeletal:  Trace edema up to ankles bilaterally.  Lymphadenopathy:    She has no cervical adenopathy.  Neurological: She is alert and oriented to person, place, and time.  Skin: Skin is warm and dry. No rash noted. No erythema. No pallor.  Psychiatric: She has a normal mood and affect. Her behavior is normal. Judgment and thought content normal.    ED Course  Procedures (including critical care time) Labs Review Labs Reviewed  BASIC METABOLIC PANEL - Abnormal; Notable for the following:    Glucose, Bld 115 (*)    All other components within normal limits  CBC - Abnormal; Notable for the following:    RDW 15.8 (*)    All other components within normal limits  MAGNESIUM  I-STAT TROPOININ, ED  POC URINE PREG, ED    Imaging Review No results found.   Date: 01/01/2014  Rate:144  Rhythm: atrial fibrillation  QRS Axis: normal  Intervals: normal  ST/T Wave abnormalities: normal  Conduction Disutrbances:none  Narrative Interpretation:   Old EKG Reviewed: none available   Patient be admitted to the hospital for further evaluation and care of her new onset atrial fibrillation with Cardizem MDM   Final diagnoses:  None   CRITICAL CARE Performed by: Brent General Total critical care time: 30 minutes Critical care time was exclusive of separately billable procedures and treating other patients. Critical care was necessary to treat or prevent imminent or life-threatening deterioration. Critical care was time spent personally by me on the following activities: development of treatment plan with patient and/or surrogate as well as nursing, discussions with consultants, evaluation of patient's response to treatment, examination of patient, obtaining history from patient or surrogate, ordering and performing treatments and  interventions, ordering and review of laboratory studies, ordering and review of radiographic studies, pulse oximetry and re-evaluation of patient's condition.  Cardizem drip was ordered and the patient was monitored and multiple rechecks    Brent General, PA-C 01/01/14 1522  Johnna Acosta, MD 01/01/14 2010

## 2014-01-01 NOTE — ED Notes (Signed)
Pt converted from A Fib to [place]--

## 2014-01-01 NOTE — ED Notes (Signed)
Cardiology at bedside.

## 2014-01-01 NOTE — ED Notes (Signed)
Pt complaining of period cramps -- requesting motrin

## 2014-01-01 NOTE — ED Notes (Signed)
Pt woke up at 0900 with heart palpitations and overall weakness.  She has a history of this but she was diagnosed with diverticulitis.  Ems reports that she is in afib from 100-160.  Pt received 325mg  ASA by ems and bp 178/120

## 2014-01-02 ENCOUNTER — Inpatient Hospital Stay (HOSPITAL_COMMUNITY): Payer: MEDICAID

## 2014-01-02 DIAGNOSIS — R079 Chest pain, unspecified: Secondary | ICD-10-CM

## 2014-01-02 DIAGNOSIS — I480 Paroxysmal atrial fibrillation: Secondary | ICD-10-CM

## 2014-01-02 DIAGNOSIS — I517 Cardiomegaly: Secondary | ICD-10-CM

## 2014-01-02 LAB — COMPREHENSIVE METABOLIC PANEL
ALT: 8 U/L (ref 0–35)
ANION GAP: 12 (ref 5–15)
AST: 9 U/L (ref 0–37)
Albumin: 3.1 g/dL — ABNORMAL LOW (ref 3.5–5.2)
Alkaline Phosphatase: 52 U/L (ref 39–117)
BUN: 20 mg/dL (ref 6–23)
CALCIUM: 8.6 mg/dL (ref 8.4–10.5)
CO2: 23 meq/L (ref 19–32)
CREATININE: 0.87 mg/dL (ref 0.50–1.10)
Chloride: 103 mEq/L (ref 96–112)
GFR calc Af Amer: 89 mL/min — ABNORMAL LOW (ref >90)
GFR, EST NON AFRICAN AMERICAN: 76 mL/min — AB (ref >90)
GLUCOSE: 113 mg/dL — AB (ref 70–99)
Potassium: 3.8 mEq/L (ref 3.7–5.3)
Sodium: 138 mEq/L (ref 137–147)
TOTAL PROTEIN: 6.1 g/dL (ref 6.0–8.3)
Total Bilirubin: <0.2 mg/dL — ABNORMAL LOW (ref 0.3–1.2)

## 2014-01-02 LAB — CBC
HEMATOCRIT: 36.8 % (ref 36.0–46.0)
Hemoglobin: 12.4 g/dL (ref 12.0–15.0)
MCH: 30.2 pg (ref 26.0–34.0)
MCHC: 33.7 g/dL (ref 30.0–36.0)
MCV: 89.8 fL (ref 78.0–100.0)
Platelets: 333 10*3/uL (ref 150–400)
RBC: 4.1 MIL/uL (ref 3.87–5.11)
RDW: 16 % — ABNORMAL HIGH (ref 11.5–15.5)
WBC: 6.4 10*3/uL (ref 4.0–10.5)

## 2014-01-02 LAB — LIPID PANEL
CHOL/HDL RATIO: 3.9 ratio
CHOLESTEROL: 168 mg/dL (ref 0–200)
HDL: 43 mg/dL (ref >39)
LDL CALC: 93 mg/dL (ref 0–99)
TRIGLYCERIDES: 159 mg/dL — AB (ref <150)
VLDL: 32 mg/dL (ref 0–40)

## 2014-01-02 LAB — TROPONIN I
Troponin I: <0.3 ng/mL (ref <0.30)
Troponin I: <0.3 ng/mL (ref <0.30)

## 2014-01-02 LAB — T4, FREE: FREE T4: 1.12 ng/dL (ref 0.80–1.80)

## 2014-01-02 LAB — HEMOGLOBIN A1C
HEMOGLOBIN A1C: 5.6 % (ref <5.7)
Mean Plasma Glucose: 114 mg/dL (ref <117)

## 2014-01-02 LAB — MRSA PCR SCREENING: MRSA by PCR: POSITIVE — AB

## 2014-01-02 MED ORDER — TECHNETIUM TC 99M SESTAMIBI GENERIC - CARDIOLITE
30.0000 | Freq: Once | INTRAVENOUS | Status: AC | PRN
  Administered 2014-01-02: 30 via INTRAVENOUS

## 2014-01-02 MED ORDER — REGADENOSON 0.4 MG/5ML IV SOLN
INTRAVENOUS | Status: AC
  Administered 2014-01-02: 0.4 mg via INTRAVENOUS
  Filled 2014-01-02: qty 5

## 2014-01-02 MED ORDER — APIXABAN 5 MG PO TABS: 5.0000 mg | ORAL_TABLET | Freq: Two times a day (BID) | ORAL | Status: DC

## 2014-01-02 MED ORDER — DILTIAZEM HCL ER COATED BEADS 180 MG PO CP24
180.0000 mg | ORAL_CAPSULE | Freq: Every day | ORAL | Status: DC
  Administered 2014-01-02: 180 mg via ORAL
  Filled 2014-01-02: qty 1

## 2014-01-02 MED ORDER — MUPIROCIN 2 % EX OINT
1.0000 "application " | TOPICAL_OINTMENT | Freq: Two times a day (BID) | CUTANEOUS | Status: DC
  Administered 2014-01-02: 1 via NASAL
  Filled 2014-01-02: qty 22

## 2014-01-02 MED ORDER — TECHNETIUM TC 99M SESTAMIBI GENERIC - CARDIOLITE
10.0000 | Freq: Once | INTRAVENOUS | Status: AC | PRN
Start: 2014-01-02 — End: 2014-01-02
  Administered 2014-01-02: 10 via INTRAVENOUS

## 2014-01-02 MED ORDER — REGADENOSON 0.4 MG/5ML IV SOLN
0.4000 mg | Freq: Once | INTRAVENOUS | Status: AC
  Administered 2014-01-02: 0.4 mg via INTRAVENOUS
  Filled 2014-01-02: qty 5

## 2014-01-02 MED ORDER — CHLORHEXIDINE GLUCONATE CLOTH 2 % EX PADS
6.0000 | MEDICATED_PAD | Freq: Every day | CUTANEOUS | Status: DC
  Administered 2014-01-02: 6 via TOPICAL

## 2014-01-02 MED ORDER — DILTIAZEM HCL ER COATED BEADS 180 MG PO CP24: 180.0000 mg | ORAL_CAPSULE | Freq: Every day | ORAL | Status: DC

## 2014-01-02 MED ORDER — DEXTROSE 5 % IV SOLN
80.0000 mg | Freq: Once | INTRAVENOUS | Status: AC
  Administered 2014-01-02: 80 mg via INTRAVENOUS

## 2014-01-02 NOTE — Plan of Care (Signed)
Problem: Consults Goal: Atrial Arhythmia Patient Education (See Patient Education module for education specifics.)  Outcome: Progressing Goal: Skin Care Protocol Initiated - if Braden Score 18 or less If consults are not indicated, leave blank or document N/A  Outcome: Not Applicable Date Met:  66/64/86 Goal: Tobacco Cessation referral if indicated Outcome: Completed/Met Date Met:  01/02/14 Goal: Diabetes Guidelines if Diabetic/Glucose > 140 If diabetic or lab glucose is > 140 mg/dl - Initiate Diabetes/Hyperglycemia Guidelines & Document Interventions  Outcome: Completed/Met Date Met:  01/02/14  Problem: Phase I Progression Outcomes Goal: Ventricular heart rate < 120/min Outcome: Completed/Met Date Met:  01/02/14 Goal: Anticoagulation Therapy per MD order Outcome: Completed/Met Date Met:  01/02/14 Goal: Heart rate or rhythm control medication Outcome: Progressing IV Cardizem, switching to PO in the morning of 11/20 Goal: Pain controlled with appropriate interventions Outcome: Completed/Met Date Met:  01/02/14 Goal: Hemodynamically stable Outcome: Completed/Met Date Met:  01/02/14

## 2014-01-02 NOTE — Plan of Care (Signed)
Problem: Phase II Progression Outcomes Goal: Progress activity as tolerated unless otherwise ordered Outcome: Completed/Met Date Met:  01/02/14     

## 2014-01-02 NOTE — Progress Notes (Signed)
UR Completed Naijah Lacek Graves-Bigelow, RN,BSN [phone]  

## 2014-01-02 NOTE — Care Management Note (Addendum)
    Page 1 of 1   01/02/2014     3:12:59 PM CARE MANAGEMENT NOTE 01/02/2014  Patient:  Crystal Brennan, Crystal Brennan   Account Number:  [id number]  Date Initiated:  01/02/2014  Documentation initiated by:  GRAVES-BIGELOW,[place] Cookson  Subjective/Objective Assessment:   Pt admitted for palpitations and was found to be in afib with RVR. Initiated on cardizem gtt. Pt has family support.     Action/Plan:   CM will provide pt with resources for PCP-[facility] for hospital f/u. Please d/c pt on generic medications due to no insurance.   Anticipated DC Date:  01/03/2014   Anticipated DC Plan:  [place]  CM consult  [facility]      Choice offered to / List presented to:             Status of service:  Completed, signed off Medicare Important Message given?  NO (If response is "NO", the following Medicare IM given date fields will be blank) Date Medicare IM given:   Medicare IM given by:   Date Additional Medicare IM given:   Additional Medicare IM given by:    Discharge Disposition:  HOME/SELF CARE  Per UR Regulation:  Reviewed for med. necessity/level of care/duration of stay  If discussed at [place] of Stay Meetings, dates discussed:    Comments:  01-02-14 [address], RN,BSN [phone] CM did provide pt with the 30 day free eliquis card. Pt will need a Rx for 30 day free and the original Rx. CM did call [facility] on [address] to see if medication is available and it is. Pt is without insurance and she will call the [facility] for hospital f/u. CM did call the clinic and left vm. [facility] can assist the pt with the patient assistance program. No further needs from CM at this time.

## 2014-01-02 NOTE — Progress Notes (Signed)
UR Completed Kailan Carmen Graves-Bigelow, RN,BSN [phone]  

## 2014-01-02 NOTE — Discharge Summary (Signed)
Physician Discharge Summary     Cardiologist:  Crenshaw Patient ID: Crystal Brennan MRN: [id number] DOB/AGE: [birth year] 48 y.o.  Admit date: 01/01/2014 Discharge date: 01/02/2014  Admission Diagnoses:  Atrial fib,   Discharge Diagnoses:  Active Problems:   Atrial fibrillation   Chest pain, exertional   Tobacco abuse  Obesity, morbid  HLD  Discharged Condition: stable  Hospital Course:   48 y/o female with a prior h/o HTN. She was last seen in our clinic in 2011 in the setting of HTN and cardiomegaly. Echo was done and showed nl LV fxn. Over the years, she has done reasonably well though beginning in July, she began to experience intermittent palpitations occurring a few x/wk w/o associated Ss, lasting about 5 mins, and resolving spontaneously. She was admitted to [facility] in August with diverticulitis. She says that she had frequent palpitations during that hospitalization but apparently was not on a monitor and never had an ECG during palps. Since then, she has noted increased frequency of palpitations, especially over the last month, and especially during walks. Ss are now occurring on a daily basis and are assoc with dyspnea, occas lightheadedness, and on a few occasions, chest discomfort. Earlier this week, she had persistent palps prior to going to bed but was eventually able to fall asleep. This AM, she awoke with tachypalps and profound weakness. She was sob and lightheaded as well. She went on to work with persistent Ss and by 10:30, she decided to seek care. She came into the ED and was found to be in afib with RVR @ 135. Labs were unrevealing. She was placed on dilt gtt @ 5mg /hr with improved HR into the high 90's to low 100's.  The patient was admitted and started on eliquis.  IV dilt continued.  She converted spontaneously over night.  She was changed to PO cardiazem 180mg  daily.  Lexiscan cardiolite was completed and negative for ischemia.  Echocardiogram revealed an  EF of 65-70% with normal wall motion. Mild concentric Hypertrophy. G1DD. Peak PA pressure of 50mmHg.  The patient was seen by Dr. Mare Ferrari who felt she was stable for DC home.   Consults: None  Significant Diagnostic Studies:   Echo Study Conclusions  - Left ventricle: The cavity size was normal. There was mild concentric hypertrophy. Systolic function was vigorous. The estimated ejection fraction was in the range of 65% to 70%. Wall motion was normal; there were no regional wall motion abnormalities. Doppler parameters are consistent with abnormal left ventricular relaxation (grade 1 diastolic dysfunction). - Pulmonary arteries: Systolic pressure was mildly increased. PA peak pressure: 40 mm Hg (S).  MYOCARDIAL IMAGING WITH SPECT (REST AND PHARMACOLOGIC-STRESS)  GATED LEFT VENTRICULAR WALL MOTION STUDY  LEFT VENTRICULAR EJECTION FRACTION  TECHNIQUE: Standard myocardial SPECT imaging was performed after resting intravenous injection of 10 mCi Tc-72m sestamibi. Subsequently, intravenous infusion of Lexiscan was performed under the supervision of the Cardiology staff. At peak effect of the drug, 30 mCi Tc-57m sestamibi was injected intravenously and standard myocardial SPECT imaging was performed. Quantitative gated imaging was also performed to evaluate left ventricular wall motion, and estimate left ventricular ejection fraction.  COMPARISON: None.  FINDINGS: Perfusion: There is severe breast attenuation artifact. Otherwise, there is no decreased activity in the left ventricle on stress imaging to suggest reversible ischemia or infarction. There is no change in myocardial uptake between the stress and rest images  Wall Motion: Normal left ventricular wall motion. No left ventricular dilation.  Left Ventricular Ejection Fraction: 78 %  IMPRESSION: 1. No reversible ischemia or infarction.  2. Normal left ventricular wall motion.  3. Low-risk  myocardial perfusion study with prominent breast attenuation artifact.  Treatments:  See above  Discharge Exam: Blood pressure 131/87, pulse 75, temperature 98.2 F (36.8 C), temperature source Oral, resp. rate 16, height 5\' 2"  (1.575 m), weight 239 lb 6 oz (108.58 kg), last menstrual period 12/31/2013, SpO2 99 %.   Disposition: 01-Home or Self Care      Discharge Instructions    Diet - low sodium heart healthy    Complete by:  As directed      Discharge instructions    Complete by:  As directed   Stop smoking.     Increase activity slowly    Complete by:  As directed             Medication List    TAKE these medications        apixaban 5 MG Tabs tablet  Commonly known as:  ELIQUIS  Take 1 tablet (5 mg total) by mouth 2 (two) times daily.     diltiazem 180 MG 24 hr capsule  Commonly known as:  CARDIZEM CD  Take 1 capsule (180 mg total) by mouth daily.     hydrochlorothiazide 25 MG tablet  Commonly known as:  HYDRODIURIL  Take 25 mg by mouth daily.       Follow-up Information    Follow up with [facility].   Contact information:   [address] [place] [address] [phone]      Follow up with Kirk Ruths, MD.   Specialty:  Cardiology   Why:  The office will call you with the appt date and time.    Contact information:   [address] [place] [address] [phone]       Signed: Tarri Fuller 01/02/2014, 4:36 PM

## 2014-01-02 NOTE — Plan of Care (Signed)
Problem: Phase II Progression Outcomes Goal: Ventricular heart rate < 100/min Outcome: Completed/Met Date Met:  01/02/14 Goal: Anticoagulation Therapy per MD order Outcome: Completed/Met Date Met:  01/02/14 Goal: CV Risk Factors identified Outcome: Completed/Met Date Met:  01/02/14 Goal: Pain controlled Outcome: Completed/Met Date Met:  01/02/14 Goal: Discharge plan established Outcome: Completed/Met Date Met:  01/02/14 Goal: Tolerating diet Outcome: Completed/Met Date Met:  01/02/14 Goal: Other Phase II Outcomes/Goals Outcome: Completed/Met Date Met:  01/02/14

## 2014-01-02 NOTE — Progress Notes (Signed)
  Echocardiogram 2D Echocardiogram has been performed.  Darlina Sicilian M 01/02/2014, 9:21 AM

## 2014-01-02 NOTE — Discharge Instructions (Addendum)
Information on my medicine - ELIQUIS (apixaban)  This medication education was reviewed with me or my healthcare representative as part of my discharge preparation.    Why was Eliquis prescribed for you? Eliquis 5mg  was prescribed for you to reduce the risk of a blood clot forming that can cause a stroke if you have a medical condition called atrial fibrillation (a type of irregular heartbeat).  What do You need to know about Eliquis ? Take your Eliquis 5mg  TWICE DAILY - one tablet in the morning and one tablet in the evening with or without food. If you have difficulty swallowing the tablet whole please discuss with your pharmacist how to take the medication safely.  Take Eliquis exactly as prescribed by your doctor and DO NOT stop taking Eliquis without talking to the doctor who prescribed the medication.  Stopping may increase your risk of developing a stroke.  Refill your prescription before you run out.  After discharge, you should have regular check-up appointments with your healthcare provider that is prescribing your Eliquis.  In the future your dose may need to be changed if your kidney function or weight changes by a significant amount or as you get older.  What do you do if you miss a dose? If you miss a dose, take it as soon as you remember on the same day and resume taking twice daily.  Do not take more than one dose of ELIQUIS at the same time to make up a missed dose.  Important Safety Information A possible side effect of Eliquis is bleeding. You should call your healthcare provider right away if you experience any of the following: ? Bleeding from an injury or your nose that does not stop. ? Unusual colored urine (red or dark brown) or unusual colored stools (red or black). ? Unusual bruising for unknown reasons. ? A serious fall or if you hit your head (even if there is no bleeding).  Some medicines may interact with Eliquis and might increase your risk of bleeding  or clotting while on Eliquis. To help avoid this, consult your healthcare provider or pharmacist prior to using any new prescription or non-prescription medications, including herbals, vitamins, non-steroidal anti-inflammatory drugs (NSAIDs) and supplements.  This website has more information on Eliquis (apixaban): http://www.eliquis.com/eliquis/home  Atrial Fibrillation Atrial fibrillation is a condition that causes your heart to beat irregularly. It may also cause your heart to beat faster than normal. Atrial fibrillation can prevent your heart from pumping blood normally. It increases your risk of stroke and heart problems. HOME CARE  Take medications as told by your doctor.  Only take medications that your doctor says are safe. Some medications can make the condition worse or happen again.  If blood thinners were prescribed by your doctor, take them exactly as told. Too much can cause bleeding. Too little and you will not have the needed protection against stroke and other problems.  Perform blood tests at home if told by your doctor.  Perform blood tests exactly as told by your doctor.  Do not drink alcohol.  Do not drink beverages with caffeine such as coffee, soda, and some teas.  Maintain a healthy weight.  Do not use diet pills unless your doctor says they are safe. They may make heart problems worse.  Follow diet instructions as told by your doctor.  Exercise regularly as told by your doctor.  Keep all follow-up appointments. GET HELP IF:  You notice a change in the speed, rhythm, or strength  of your heartbeat.  You suddenly begin peeing (urinating) more often.  You get tired more easily when moving or exercising. GET HELP RIGHT AWAY IF:   You have chest or belly (abdominal) pain.  You feel sick to your stomach (nauseous).  You are short of breath.  You suddenly have swollen feet and ankles.  You feel dizzy.  You face, arms, or legs feel numb or  weak.  There is a change in your vision or speech. MAKE SURE YOU:   Understand these instructions.  Will watch your condition.  Will get help right away if you are not doing well or get worse. Document Released: 11/09/2007 Document Revised: 06/16/2013 Document Reviewed: 03/12/2012 [facility] Patient Information 2015 [place], [place]. This information is not intended to replace advice given to you by your health care provider. Make sure you discuss any questions you have with your health care provider.  Fat and Cholesterol Control Diet Fat and cholesterol levels in your blood and organs are influenced by your diet. High levels of fat and cholesterol may lead to diseases of the heart, small and large blood vessels, gallbladder, liver, and pancreas. CONTROLLING FAT AND CHOLESTEROL WITH DIET Although exercise and lifestyle factors are important, your diet is key. That is because certain foods are known to raise cholesterol and others to lower it. The goal is to balance foods for their effect on cholesterol and more importantly, to replace saturated and trans fat with other types of fat, such as monounsaturated fat, polyunsaturated fat, and omega-3 fatty acids. On average, a person should consume no more than 15 to 17 g of saturated fat daily. Saturated and trans fats are considered "bad" fats, and they will raise LDL cholesterol. Saturated fats are primarily found in animal products such as meats, butter, and cream. However, that does not mean you need to give up all your favorite foods. Today, there are good tasting, low-fat, low-cholesterol substitutes for most of the things you like to eat. Choose low-fat or nonfat alternatives. Choose round or loin cuts of red meat. These types of cuts are lowest in fat and cholesterol. Chicken (without the skin), fish, veal, and ground [place] breast are great choices. Eliminate fatty meats, such as hot dogs and salami. Even shellfish have little or no saturated fat.  Have a 3 oz (85 g) portion when you eat lean meat, poultry, or fish. Trans fats are also called "partially hydrogenated oils." They are oils that have been scientifically manipulated so that they are solid at room temperature resulting in a longer shelf life and improved taste and texture of foods in which they are added. Trans fats are found in stick margarine, some tub margarines, cookies, crackers, and baked goods.  When baking and cooking, oils are a great substitute for butter. The monounsaturated oils are especially beneficial since it is believed they lower LDL and raise HDL. The oils you should avoid entirely are saturated tropical oils, such as coconut and palm.  Remember to eat a lot from food groups that are naturally free of saturated and trans fat, including fish, fruit, vegetables, beans, grains (barley, rice, couscous, bulgur wheat), and pasta (without cream sauces).  IDENTIFYING FOODS THAT LOWER FAT AND CHOLESTEROL  Soluble fiber may lower your cholesterol. This type of fiber is found in fruits such as apples, vegetables such as broccoli, potatoes, and carrots, legumes such as beans, peas, and lentils, and grains such as barley. Foods fortified with plant sterols (phytosterol) may also lower cholesterol. You should eat  at least 2 g per day of these foods for a cholesterol lowering effect.  Read package labels to identify low-saturated fats, trans fat free, and low-fat foods at the supermarket. Select cheeses that have only 2 to 3 g saturated fat per ounce. Use a heart-healthy tub margarine that is free of trans fats or partially hydrogenated oil. When buying baked goods (cookies, crackers), avoid partially hydrogenated oils. Breads and muffins should be made from whole grains (whole-wheat or whole oat flour, instead of "flour" or "enriched flour"). Buy non-creamy canned soups with reduced salt and no added fats.  FOOD PREPARATION TECHNIQUES  Never deep-fry. If you must fry, either stir-fry,  which uses very little fat, or use non-stick cooking sprays. When possible, broil, bake, or roast meats, and steam vegetables. Instead of putting butter or margarine on vegetables, use lemon and herbs, applesauce, and cinnamon (for squash and sweet potatoes). Use nonfat yogurt, salsa, and low-fat dressings for salads.  LOW-SATURATED FAT / LOW-FAT FOOD SUBSTITUTES Meats / Saturated Fat (g)  Avoid: Steak, marbled (3 oz/85 g) / 11 g  Choose: Steak, lean (3 oz/85 g) / 4 g  Avoid: Hamburger (3 oz/85 g) / 7 g  Choose: Hamburger, lean (3 oz/85 g) / 5 g  Avoid: Ham (3 oz/85 g) / 6 g  Choose: Ham, lean cut (3 oz/85 g) / 2.4 g  Avoid: Chicken, with skin, dark meat (3 oz/85 g) / 4 g  Choose: Chicken, skin removed, dark meat (3 oz/85 g) / 2 g  Avoid: Chicken, with skin, light meat (3 oz/85 g) / 2.5 g  Choose: Chicken, skin removed, light meat (3 oz/85 g) / 1 g Dairy / Saturated Fat (g)  Avoid: Whole milk (1 cup) / 5 g  Choose: Low-fat milk, 2% (1 cup) / 3 g  Choose: Low-fat milk, 1% (1 cup) / 1.5 g  Choose: Skim milk (1 cup) / 0.3 g  Avoid: Hard cheese (1 oz/28 g) / 6 g  Choose: Skim milk cheese (1 oz/28 g) / 2 to 3 g  Avoid: Cottage cheese, 4% fat (1 cup) / 6.5 g  Choose: Low-fat cottage cheese, 1% fat (1 cup) / 1.5 g  Avoid: Ice cream (1 cup) / 9 g  Choose: Sherbet (1 cup) / 2.5 g  Choose: Nonfat frozen yogurt (1 cup) / 0.3 g  Choose: Frozen fruit bar / trace  Avoid: Whipped cream (1 tbs) / 3.5 g  Choose: Nondairy whipped topping (1 tbs) / 1 g Condiments / Saturated Fat (g)  Avoid: Mayonnaise (1 tbs) / 2 g  Choose: Low-fat mayonnaise (1 tbs) / 1 g  Avoid: Butter (1 tbs) / 7 g  Choose: Extra light margarine (1 tbs) / 1 g  Avoid: Coconut oil (1 tbs) / 11.8 g  Choose: Olive oil (1 tbs) / 1.8 g  Choose: Corn oil (1 tbs) / 1.7 g  Choose: Safflower oil (1 tbs) / 1.2 g  Choose: Sunflower oil (1 tbs) / 1.4 g  Choose: Soybean oil (1 tbs) / 2.4 g  Choose: Canola  oil (1 tbs) / 1 g Document Released: 01/30/2005 Document Revised: 05/27/2012 Document Reviewed: 04/30/2013 [employer] Patient Information 2015 [place], [place]. This information is not intended to replace advice given to you by your health care provider. Make sure you discuss any questions you have with your health care provider.

## 2014-01-02 NOTE — Progress Notes (Signed)
Subjective: No Fluttering, SOB  Objective: Vital signs in last 24 hours: Temp:  [97.7 F (36.5 C)-98.5 F (36.9 C)] 97.7 F (36.5 C) (11/20 0530) Pulse Rate:  [76-147] 77 (11/20 0530) Resp:  [16-34] 16 (11/20 0530) BP: (115-140)/(48-110) 132/67 mmHg (11/20 0530) SpO2:  [96 %-100 %] 100 % (11/20 0530) Weight:  [239 lb (108.41 kg)-239 lb 9.6 oz (119.147 kg)] 239 lb 6 oz (108.58 kg) (11/20 0530) Last BM Date: 01/01/14  Intake/Output from previous day: 11/19 0701 - 11/20 0700 In: 540 [P.O.:480; I.V.:60] Out: -  Intake/Output this shift:    Medications Current Facility-Administered Medications  Medication Dose Route Frequency Provider Last Rate Last Dose  . 0.9 %  sodium chloride infusion  250 mL Intravenous PRN Rogelia Mire, NP      . 0.9 %  sodium chloride infusion  250 mL Intravenous Continuous Rogelia Mire, NP   250 mL at 01/01/14 2015  . apixaban (ELIQUIS) tablet 5 mg  5 mg Oral BID Rogelia Mire, NP   5 mg at 01/01/14 2334  . diltiazem (CARDIZEM) 100 mg in dextrose 5 % 100 mL (1 mg/mL) infusion  5-15 mg/hr Intravenous Titrated Rogelia Mire, NP 5 mL/hr at 01/01/14 2022 5 mg/hr at 01/01/14 2022  . hydrochlorothiazide (HYDRODIURIL) tablet 25 mg  25 mg Oral Daily Rogelia Mire, NP      . ibuprofen (ADVIL,MOTRIN) tablet 600 mg  600 mg Oral Q6H PRN Stephani Police, MD   600 mg at 01/01/14 2353  . ondansetron (ZOFRAN) injection 4 mg  4 mg Intravenous Q8H PRN Stephani Police, MD   4 mg at 01/01/14 2353  . sodium chloride 0.9 % injection 3 mL  3 mL Intravenous Q12H Rogelia Mire, NP   3 mL at 01/01/14 2200  . sodium chloride 0.9 % injection 3 mL  3 mL Intravenous PRN Rogelia Mire, NP      . sodium chloride 0.9 % injection 3 mL  3 mL Intravenous Q12H Rogelia Mire, NP   0 mL at 01/01/14 2200  . sodium chloride 0.9 % injection 3 mL  3 mL Intravenous PRN Rogelia Mire, NP        PE: General appearance: alert, cooperative and no  distress Lungs: clear to auscultation bilaterally Heart: regular rate and rhythm, S1, S2 normal, no murmur, click, rub or gallop Extremities: No LEE Pulses: 2+ and symmetric Skin: Warm and dry Neurologic: Grossly normal  Lab Results:   Recent Labs  01/01/14 1200  WBC 7.0  HGB 14.0  HCT 39.9  PLT 385   BMET  Recent Labs  01/01/14 1200 01/02/14 0159  NA 141 138  K 3.8 3.8  CL 104 103  CO2 22 23  GLUCOSE 115* 113*  BUN 9 20  CREATININE 0.70 0.87  CALCIUM 9.5 8.6   PT/INR No results for input(s): LABPROT, INR in the last 72 hours. Cholesterol  Recent Labs  01/02/14 0159  CHOL 168   Lipid Panel     Component Value Date/Time   CHOL 168 01/02/2014 0159   TRIG 159* 01/02/2014 0159   HDL 43 01/02/2014 0159   CHOLHDL 3.9 01/02/2014 0159   VLDL 32 01/02/2014 0159   LDLCALC 93 01/02/2014 0159    Cardiac Panel (last 3 results)  Recent Labs  01/01/14 2100 01/02/14 0159  TROPONINI <0.30 <0.30     Assessment/Plan 48 y/o female w/a h/o HTN, tobacco abuse, obesity, and cardiomegaly who presented to the  ED with palpitations and was found to be in afib with RVR.   Active Problems:   Atrial fibrillation   Chest pain, exertional    Tobacco abuse   Obesity, morbid   HLD     Plan:  Converted spontneously.  On IV dilt.  Eliquis added yesterday.  Under 48 hours from onset.  Sounds like she goes in and out.  Will convert to PO dilt 180mg  daily.  Her chest pain sounds positional from how she describes it, however, with her risk factors, will do a lexiscan today.  May need to be two day study, 239#.   2D echo in process.  She bought a treadmill and is trying to cut back on sugar.   LOS: 1 day    HAGER, BRYAN PA-C 01/02/2014 8:37 AM Agree with above. Now back in NSR. No further chest pain or dyspnea. Awaiting treadmill myoview this am. Lungs clear. Heart no gallop. Possible discharge later today if myoview can be completed today. Echo done, results pending.

## 2014-01-02 NOTE — Progress Notes (Signed)
lexiscan myoview completed without complications, she did have SOB mildly, and nausea.  At the end of the study she had big spot before rt eye.  IV aminophylline given - spot remains, neuro exam non focal.  Hx of migraines and at times with spots.

## 2014-01-05 ENCOUNTER — Telehealth: Payer: Self-pay | Admitting: Physician Assistant

## 2014-01-06 NOTE — Telephone Encounter (Signed)
Closed encounter °

## 2014-01-19 ENCOUNTER — Telehealth: Payer: Self-pay | Admitting: Physician Assistant

## 2014-01-19 MED ORDER — APIXABAN 5 MG PO TABS: 5.0000 mg | ORAL_TABLET | Freq: Two times a day (BID) | ORAL | Status: DC

## 2014-01-19 NOTE — Telephone Encounter (Signed)
Will switch patient to [facility], due to $4/month copay for those without insurance.  Calculated CrCl at 89.5, based on weight of 73.3kg.  Wt calculated at ideal weight + 40% of difference between actual and ideal weight.  Gave patient samples x 4 weeks plus coupono card.    Explained dosing to patient, rx sent to [facility].  Pt picked up samples, voiced understanding

## 2014-01-19 NOTE — Telephone Encounter (Signed)
Pt does not have enough medicine until her appt on Wednesday. Please call her a generic for Eliquis in today to [phone].Pt is not sure about the directions of Diltiazem.

## 2014-01-19 NOTE — Telephone Encounter (Signed)
Eliquis refilled.   LMTCB to provide med clarification when patient calls back.

## 2014-01-19 NOTE — Telephone Encounter (Signed)
Spoke with patient and informed her diltiazem dose is once daily. She states she has been taking this BID.   She also states she cannot afford eliquis and will be out before OV 12/9. She does not have a pill to take her PM dose. She used the free 30 day trial card, but thinks they only gave her 30 pills instead of 30 day supply.   Patient has no insurance.   Spoke with Erasmo Downer as no eliquis samples are available. Patient can take savaysa if has no insurance for $4/month   Patient will come by after 4pm to obtain samples

## 2014-01-21 ENCOUNTER — Ambulatory Visit (INDEPENDENT_AMBULATORY_CARE_PROVIDER_SITE_OTHER): Payer: Self-pay | Admitting: Physician Assistant

## 2014-01-21 ENCOUNTER — Encounter: Payer: Self-pay | Admitting: Physician Assistant

## 2014-01-21 VITALS — BP 146/85 | HR 93 | Ht 62.0 in | Wt 236.3 lb

## 2014-01-21 DIAGNOSIS — Z720 Tobacco use: Secondary | ICD-10-CM

## 2014-01-21 DIAGNOSIS — I480 Paroxysmal atrial fibrillation: Secondary | ICD-10-CM

## 2014-01-21 DIAGNOSIS — E669 Obesity, unspecified: Secondary | ICD-10-CM

## 2014-01-21 DIAGNOSIS — I10 Essential (primary) hypertension: Secondary | ICD-10-CM

## 2014-01-21 NOTE — Assessment & Plan Note (Signed)
Maintaining NSR.  Continue diltiazem and eliquis.  We provided her with samples and are trying to get her assistance.

## 2014-01-21 NOTE — Assessment & Plan Note (Signed)
She has quit and is using E-cigarettes.

## 2014-01-21 NOTE — Patient Instructions (Signed)
Your physician recommends that you schedule a follow-up appointment in: 1 Month with Dr Stanford Breed  Your physician has recommended you make the following change in your medication: Start Eliquis 5 mg daily

## 2014-01-21 NOTE — Assessment & Plan Note (Signed)
The patient has bought a treadmill and plans to start using it next week. Why not today?

## 2014-01-21 NOTE — Progress Notes (Signed)
Patient ID: Crystal Brennan, female   DOB: 09/26/1965, 48 y.o.   MRN: [id number]    Date:  01/21/2014   ID:  Crystal Brennan, DOB Jan 27, 1966, MRN [id number]  PCP:  No PCP Per Patient  Primary Cardiologist:  Stanford Breed    History of Present Illness: [name] is a 48 y.o. female with a prior h/o HTN. She was last seen in our clinic in 2011 in the setting of HTN and cardiomegaly. Echo was done and showed nl LV fxn. Over the years, she has done reasonably well though beginning in July, she began to experience intermittent palpitations occurring a few x/wk w/o associated Ss, lasting about 5 mins, and resolving spontaneously. She was admitted to [facility] in August with diverticulitis. She says that she had frequent palpitations during that hospitalization but apparently was not on a monitor and never had an ECG during palps. Since then, she has noted increased frequency of palpitations, especially over the last month, and especially during walks. Ss are now occurring on a daily basis and are assoc with dyspnea, occas lightheadedness, and on a few occasions, chest discomfort. Earlier this week, she had persistent palps prior to going to bed but was eventually able to fall asleep. This AM, she awoke with tachypalps and profound weakness. She was sob and lightheaded as well. She went on to work with persistent Ss and by 10:30, she decided to seek care. She came into the ED and was found to be in afib with RVR @ 135. Labs were unrevealing. She was placed on dilt gtt @ 5mg /hr with improved HR into the high 90's to low 100's. The patient was admitted and started on eliquis. IV dilt continued. She converted spontaneously over night. She was changed to PO cardiazem 180mg  daily. Lexiscan cardiolite was completed and negative for ischemia. Echocardiogram revealed an EF of 65-70% with normal wall motion. Mild concentric Hypertrophy. G1DD. Peak PA pressure of 37mmHg.   The patient presents today for  post hospital eval. She reports doing well although she is anxious.  She was running out of Eliquis so her PCp gave her edoxaban.  She quit smoking an started using e-cigarettes.    The patient currently denies nausea, vomiting, fever, chest pain, shortness of breath beyond baseline, orthopnea, dizziness, PND, cough, congestion, abdominal pain, hematochezia, melena, lower extremity edema, claudication.  Wt Readings from Last 3 Encounters:  01/21/14 236 lb 4.8 oz (107.185 kg)  01/02/14 239 lb 6 oz (108.58 kg)  10/05/13 235 lb (106.595 kg)     Past Medical History  Diagnosis Date  . Chronic back pain     "upper" (01/01/2014)  . Hypertension   . Depression   . Anxiety   . Anemia   . Menometrorrhagia   . Insomnia   . Diverticulitis   . Sickle cell trait   . Cardiomegaly     a. 2011 Echo: EF 65-70%, Gr 1 DD, nl wall motion.  . Morbid obesity   . Tobacco abuse   . Palpitations     a. Date back to 08/2013.  . Diverticulitis     a. 09/2013  . Family history of adverse reaction to anesthesia     "Mom gets so sick off of it"  . Heart murmur   . Chronic bronchitis     "get it ~ q yr" (01/01/2014)  . GERD (gastroesophageal reflux disease)   . Headache     "monthly" (01/01/2014)  . Migraine     "monthly" (01/01/2014)  .  Arthritis     "qwhere"    Current Outpatient Prescriptions  Medication Sig Dispense Refill  . apixaban (ELIQUIS) 5 MG TABS tablet Take 1 tablet (5 mg total) by mouth 2 (two) times daily. 60 tablet 0  . diltiazem (CARDIZEM CD) 180 MG 24 hr capsule Take 1 capsule (180 mg total) by mouth daily. 30 capsule 11  . hydrochlorothiazide (HYDRODIURIL) 25 MG tablet Take 25 mg by mouth daily.     No current facility-administered medications for this visit.    Allergies:    Allergies  Allergen Reactions  . Benadryl [Diphenhydramine Hcl] Hives and Itching  . Codeine Nausea And Vomiting  . Dilaudid [Hydromorphone Hcl]   . Percocet [Oxycodone-Acetaminophen] Hives and Itching     Social History:  The patient  reports that she quit smoking about 3 weeks ago. Her smoking use included Cigarettes. She has a 9.9 pack-year smoking history. She does not have any smokeless tobacco history on file. She reports that she drinks about 1.8 oz of alcohol per week. She reports that she does not use illicit drugs.   Family history:   Family History  Problem Relation Age of Onset  . Heart attack Mother     ? in her 63's - never sought treatment  . Pulmonary embolism Mother   . Other Father     ? hx  . Lupus Sister   . Heart failure Sister     ROS:  Please see the history of present illness.  All other systems reviewed and negative.   PHYSICAL EXAM: VS:  BP 146/85 mmHg  Pulse 93  Ht 5\' 2"  (1.575 m)  Wt 236 lb 4.8 oz (107.185 kg)  BMI 43.21 kg/m2  LMP 12/31/2013 Obese, well developed, in no acute distress HEENT: Pupils are equal round react to light accommodation extraocular movements are intact.  Neck: no JVDNo cervical lymphadenopathy. Cardiac: Regular rate and rhythm without murmurs rubs or gallops. Lungs:  clear to auscultation bilaterally, no wheezing, rhonchi or rales Ext: no lower extremity edema.  2+ radial and dorsalis pedis pulses. Skin: warm and dry Neuro:  Grossly normal  EKG:  NSR Qtc 43ms rate 93    ASSESSMENT AND PLAN:  Problem List Items Addressed This Visit    Atrial fibrillation - Primary    Maintaining NSR.  Continue diltiazem and eliquis.  We provided her with samples and are trying to get her assistance.     HYPERTENSION, BENIGN    Mildly elevated BP.  No changes.  Followup at next appt.    Obesity    The patient has bought a treadmill and plans to start using it next week. Why not today?    Tobacco abuse    She has quit and is using E-cigarettes.

## 2014-01-21 NOTE — Assessment & Plan Note (Signed)
Mildly elevated BP.  No changes.  Followup at next appt.

## 2014-02-11 ENCOUNTER — Telehealth: Payer: Self-pay | Admitting: Physician Assistant

## 2014-02-11 ENCOUNTER — Emergency Department (HOSPITAL_COMMUNITY): Payer: Self-pay

## 2014-02-11 ENCOUNTER — Encounter (HOSPITAL_COMMUNITY): Payer: Self-pay | Admitting: Nurse Practitioner

## 2014-02-11 ENCOUNTER — Emergency Department (HOSPITAL_COMMUNITY)
Admission: EM | Admit: 2014-02-11 | Discharge: 2014-02-11 | Disposition: A | Discharge status: Home / Self Care | Payer: Self-pay | Attending: Emergency Medicine | Admitting: Emergency Medicine

## 2014-02-11 DIAGNOSIS — G8929 Other chronic pain: Secondary | ICD-10-CM | POA: Insufficient documentation

## 2014-02-11 DIAGNOSIS — Z8719 Personal history of other diseases of the digestive system: Secondary | ICD-10-CM | POA: Insufficient documentation

## 2014-02-11 DIAGNOSIS — M1990 Unspecified osteoarthritis, unspecified site: Secondary | ICD-10-CM | POA: Insufficient documentation

## 2014-02-11 DIAGNOSIS — R011 Cardiac murmur, unspecified: Secondary | ICD-10-CM | POA: Insufficient documentation

## 2014-02-11 DIAGNOSIS — F419 Anxiety disorder, unspecified: Secondary | ICD-10-CM | POA: Insufficient documentation

## 2014-02-11 DIAGNOSIS — Z7902 Long term (current) use of antithrombotics/antiplatelets: Secondary | ICD-10-CM | POA: Insufficient documentation

## 2014-02-11 DIAGNOSIS — I10 Essential (primary) hypertension: Secondary | ICD-10-CM

## 2014-02-11 DIAGNOSIS — Z87891 Personal history of nicotine dependence: Secondary | ICD-10-CM | POA: Insufficient documentation

## 2014-02-11 DIAGNOSIS — E669 Obesity, unspecified: Secondary | ICD-10-CM

## 2014-02-11 DIAGNOSIS — R079 Chest pain, unspecified: Secondary | ICD-10-CM

## 2014-02-11 DIAGNOSIS — I480 Paroxysmal atrial fibrillation: Secondary | ICD-10-CM

## 2014-02-11 DIAGNOSIS — Z862 Personal history of diseases of the blood and blood-forming organs and certain disorders involving the immune mechanism: Secondary | ICD-10-CM | POA: Insufficient documentation

## 2014-02-11 DIAGNOSIS — Z79899 Other long term (current) drug therapy: Secondary | ICD-10-CM | POA: Insufficient documentation

## 2014-02-11 DIAGNOSIS — R002 Palpitations: Secondary | ICD-10-CM

## 2014-02-11 LAB — BASIC METABOLIC PANEL
ANION GAP: 13 (ref 5–15)
BUN: 11 mg/dL (ref 6–23)
CO2: 19 mmol/L (ref 19–32)
CREATININE: 0.94 mg/dL (ref 0.50–1.10)
Calcium: 9 mg/dL (ref 8.4–10.5)
Chloride: 103 mEq/L (ref 96–112)
GFR calc Af Amer: 81 mL/min — ABNORMAL LOW (ref >90)
GFR calc non Af Amer: 70 mL/min — ABNORMAL LOW (ref >90)
Glucose, Bld: 135 mg/dL — ABNORMAL HIGH (ref 70–99)
Potassium: 3.4 mmol/L — ABNORMAL LOW (ref 3.5–5.1)
SODIUM: 135 mmol/L (ref 135–145)

## 2014-02-11 LAB — CBC
HCT: 37.3 % (ref 36.0–46.0)
Hemoglobin: 12.9 g/dL (ref 12.0–15.0)
MCH: 29.9 pg (ref 26.0–34.0)
MCHC: 34.6 g/dL (ref 30.0–36.0)
MCV: 86.5 fL (ref 78.0–100.0)
Platelets: 343 10*3/uL (ref 150–400)
RBC: 4.31 MIL/uL (ref 3.87–5.11)
RDW: 14.6 % (ref 11.5–15.5)
WBC: 9.5 10*3/uL (ref 4.0–10.5)

## 2014-02-11 LAB — I-STAT TROPONIN, ED: TROPONIN I, POC: 0 ng/mL (ref 0.00–0.08)

## 2014-02-11 LAB — BRAIN NATRIURETIC PEPTIDE: B Natriuretic Peptide: 18.2 pg/mL (ref 0.0–100.0)

## 2014-02-11 MED ORDER — METOPROLOL TARTRATE 25 MG PO TABS
25.0000 mg | ORAL_TABLET | Freq: Once | ORAL | Status: AC
  Administered 2014-02-11: 25 mg via ORAL
  Filled 2014-02-11: qty 1

## 2014-02-11 MED ORDER — POTASSIUM CHLORIDE CRYS ER 20 MEQ PO TBCR
40.0000 meq | EXTENDED_RELEASE_TABLET | Freq: Once | ORAL | Status: AC
  Administered 2014-02-11: 40 meq via ORAL
  Filled 2014-02-11: qty 2

## 2014-02-11 MED ORDER — METOPROLOL TARTRATE 25 MG PO TABS: 25.0000 mg | ORAL_TABLET | Freq: Two times a day (BID) | ORAL | Status: DC

## 2014-02-11 NOTE — ED Notes (Signed)
NAD at this time. Pt is stable and leaving with husband.

## 2014-02-11 NOTE — Telephone Encounter (Signed)
Spoke w/ Dr. [place] (DOD), he advised the patient present here for EKG to confirm rhythm.   I called pt back but received no answer, left message to return call.

## 2014-02-11 NOTE — Telephone Encounter (Signed)
Spoke w/ patient, she states palpitations have calmed down some.  She will come in today to have EKG done.

## 2014-02-11 NOTE — ED Notes (Signed)
Cardiologist at bedside.  

## 2014-02-11 NOTE — ED Notes (Signed)
She reports a history of AFib and has felt episodes of possible a-fib since Monday. She states 'i can feel my heart irregular, like when im in a-fib. Ive felt sob and some CP too. It gets worse when i lie down at night."

## 2014-02-11 NOTE — H&P (Addendum)
Patient ID: Crystal Brennan MRN: [id number], DOB/AGE: Aug 24, 1965   Admit date: 02/11/2014   Primary Physician: No PCP Per Patient Primary Cardiologist: Dr Stanford Breed  HPI: 48 y/o obese, AA female with a h/o HTN, presented in Nov 2015 with AF with RVR and chest discomfort. She converted then after receiving IV Diltiazem. She was placed omn Eliquis. She had a low risk Myoview and normal echo (LA WNL, PA 40 mmHg). She was seen in the office 01/21/14 and was doing well. She called the office today saying she has had irregular heart rate off and on since Monday. She was instructed to come to the office for an EKG but she "felt worse" and showed up in the ER. In the ERE she is in NSR. She denies any OTC medications and says she has been compliant with her Diltiazem and Eliquis (also on HCTZ 25).    Problem List: Past Medical History  Diagnosis Date  . Chronic back pain     "upper" (01/01/2014)  . Hypertension   . Depression   . Anxiety   . Anemia   . Menometrorrhagia   . Insomnia   . Diverticulitis   . Sickle cell trait   . Cardiomegaly     a. 2011 Echo: EF 65-70%, Gr 1 DD, nl wall motion.  . Morbid obesity   . Tobacco abuse   . Palpitations     a. Date back to 08/2013.  . Diverticulitis     a. 09/2013  . Family history of adverse reaction to anesthesia     "Mom gets so sick off of it"  . Heart murmur   . Chronic bronchitis     "get it ~ q yr" (01/01/2014)  . GERD (gastroesophageal reflux disease)   . Headache     "monthly" (01/01/2014)  . Migraine     "monthly" (01/01/2014)  . Arthritis     "qwhere"    Past Surgical History  Procedure Laterality Date  . Cholecystectomy  2009  . Tubal ligation  198  . Cesarean section  7353; 1986; 1988     Allergies:  Allergies  Allergen Reactions  . Benadryl [Diphenhydramine Hcl] Hives and Itching  . Codeine Nausea And Vomiting  . Dilaudid [Hydromorphone Hcl]   . Percocet [Oxycodone-Acetaminophen] Hives and Itching     Home  Medications No current facility-administered medications for this encounter.   Current Outpatient Prescriptions  Medication Sig Dispense Refill  . apixaban (ELIQUIS) 5 MG TABS tablet Take 1 tablet (5 mg total) by mouth 2 (two) times daily. 60 tablet 0  . diltiazem (CARDIZEM CD) 180 MG 24 hr capsule Take 1 capsule (180 mg total) by mouth daily. 30 capsule 11  . hydrochlorothiazide (HYDRODIURIL) 25 MG tablet Take 25 mg by mouth daily.    Marland Kitchen ibuprofen (ADVIL,MOTRIN) 400 MG tablet Take 400 mg by mouth every 6 (six) hours as needed for cramping.       Family History  Problem Relation Age of Onset  . Heart attack Mother     ? in her 47's - never sought treatment  . Pulmonary embolism Mother   . Other Father     ? hx  . Lupus Sister   . Heart failure Sister      History   Social History  . Marital Status: Single    Spouse Name: N/A    Number of Children: N/A  . Years of Education: N/A   Occupational History  . Not on file.  Social History Main Topics  . Smoking status: Former Smoker -- 0.33 packs/day for 30 years    Types: Cigarettes    Quit date: 12/31/2013  . Smokeless tobacco: Not on file     Comment: pt smoke electronic cigarette  . Alcohol Use: 1.8 oz/week    0 Not specified, 3 Glasses of wine per week  . Drug Use: No  . Sexual Activity: Not on file   Other Topics Concern  . Not on file   Social History Narrative   Lives in [place] with fiance.  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  Walks regularly.  Trying to lose weight.     Review of Systems: General: negative for chills, fever, night sweats or weight changes.  Cardiovascular: negative for chest pain, dyspnea on exertion, edema, orthopnea, palpitations, paroxysmal nocturnal dyspnea or shortness of breath Dermatological: negative for rash Respiratory: negative for cough or wheezing Urologic: negative for hematuria Abdominal: negative for nausea, vomiting, diarrhea, bright red blood per rectum,  melena, or hematemesis Neurologic: negative for visual changes, syncope, or dizziness All other systems reviewed and are otherwise negative except as noted above.  Physical Exam: Blood pressure 115/60, pulse 79, temperature 98.1 F (36.7 C), temperature source Oral, resp. rate 17, last menstrual period 01/14/2014, SpO2 22 %.  General appearance: alert, cooperative, no distress and morbidly obese Neck: no carotid bruit and no JVD Lungs: clear to auscultation bilaterally Heart: regular rate and rhythm Abdomen: obese Extremities: extremities normal, atraumatic, no cyanosis or edema Pulses: 2+ and symmetric Skin: Skin color, texture, turgor normal. No rashes or lesions Neurologic: Grossly normal   Labs:   Results for orders placed or performed during the hospital encounter of 02/11/14 (from the past 24 hour(s))  CBC     Status: None   Collection Time: 02/11/14  1:09 PM  Result Value Ref Range   WBC 9.5 4.0 - 10.5 K/uL   RBC 4.31 3.87 - 5.11 MIL/uL   Hemoglobin 12.9 12.0 - 15.0 g/dL   HCT 37.3 36.0 - 46.0 %   MCV 86.5 78.0 - 100.0 fL   MCH 29.9 26.0 - 34.0 pg   MCHC 34.6 30.0 - 36.0 g/dL   RDW 14.6 11.5 - 15.5 %   Platelets 343 150 - 400 K/uL  Basic metabolic panel     Status: Abnormal   Collection Time: 02/11/14  1:09 PM  Result Value Ref Range   Sodium 135 135 - 145 mmol/L   Potassium 3.4 (L) 3.5 - 5.1 mmol/L   Chloride 103 96 - 112 mEq/L   CO2 19 19 - 32 mmol/L   Glucose, Bld 135 (H) 70 - 99 mg/dL   BUN 11 6 - 23 mg/dL   Creatinine, Ser 0.94 0.50 - 1.10 mg/dL   Calcium 9.0 8.4 - 10.5 mg/dL   GFR calc non Af Amer 70 (L) >90 mL/min   GFR calc Af Amer 81 (L) >90 mL/min   Anion gap 13 5 - 15  BNP (order ONLY if patient complains of dyspnea/SOB AND you have documented it for THIS visit)     Status: None   Collection Time: 02/11/14  1:09 PM  Result Value Ref Range   B Natriuretic Peptide 18.2 0.0 - 100.0 pg/mL  I-stat troponin, ED (not at [facility])     Status: None   Collection  Time: 02/11/14  1:35 PM  Result Value Ref Range   Troponin i, poc 0.00 0.00 - 0.08 ng/mL   Comment 3  Radiology/Studies: Dg Chest 2 View  02/11/2014   CLINICAL DATA:  Chest pain. Pt states she has been experiencing A fib since Monday. Pt was hospitalized with a fib x 1 month ago. Pt's a fib symptoms worsened earlier today so she came into the ED. The ED nurses state she was not currently going through a fib while in the ED. Hx of 1/2 pack of cigarettes per day for years until a month ago, pt began smoking the E-cigarettes x 1 month ago. Hx of HTN controlled with medication, and enlarged heart. Pt currently taking Eliquis and Diltiazem.  EXAM: CHEST  2 VIEW  COMPARISON:  12/27/2009  FINDINGS: Heart is mildly enlarged. There are no focal consolidations or pleural effusions. No pulmonary edema. Degenerative changes are seen in the upper thoracic spine. Surgical clips are present in the right upper quadrant of the abdomen.  IMPRESSION: Cardiomegaly.  No evidence for acute cardiopulmonary abnormality.   Electronically Signed   By: Shon Hale M.D.   On: 02/11/2014 13:45    EKG:NSR  ASSESSMENT AND PLAN:  Principal Problem:   Palpitations Active Problems:   PAF (paroxysmal atrial fibrillation)   Obesity   Hypertension   PLAN:  Add beta blocker- vs increase Diltiazem-will review with MD. I gave her one dose of Metoprolol in ER. She'll need a sleep study at some point as an OP. Holter monitor if she has recurrent palpitations. Replace K+.   Henri Medal, PA-C 02/11/2014, 2:40 PM   Patient seen and examined. Agree with assessment and plan. Pleasant 48 morbidly obese AAF who is sp recent development of PAF which converted pharmacologically.  She has been on NOAC therapy with eliquis and cardizem 180 mg daily. Over the past several days she admits to recurrent episodes of PAF leading to ER evaluation. Symptoms are worse when lying flat. Upon questioning, patient snores loudly and  wakes up gasping for breath highly suggestive of OSA. Her sleep is nonrestorative and she notes frequent nocturnal awakening.  I discussed with her the importance of an evaluation for sleep apnea which if left untreated will significantly increase potential for recurrent AF. ECG reviewed independently by me  and telemetry reveal sinus rhythm presently without significant STT changes. . Will add metoprolol 25 mg bid and increase diltiazem to 240 mg. Will need to schedule for PSG/split night if possible. OK to dc today with ov f/u. Will need KCL replacement and consider MG, TSH if not done recently. Discussed the importance of weight loss, avoidance of caffeine, pseudoephedrine, etc.    Troy Sine, MD, [facility] 02/11/2014 3:22 PM

## 2014-02-11 NOTE — Discharge Instructions (Signed)
Continue taking your medicines.   Add metoprolol 25 mg twice daily.   See cardiology in the office.   Return to ER if you have worse chest pain, shortness of breath, palpitations.

## 2014-02-11 NOTE — Telephone Encounter (Signed)
Spoke w/ pt who states palpitations ongoing for 2 days. She states these started Monday and were worse Tuesday night.  She has A Fib. She takes Diltiazem 180mg  24hr & Eliquis 5mg  daily.  She is concerned that the Diltiazem is wearing off and can feel her heart beating fast.  Patient denies SOB or CP. I told pt I will have physician advise and that I will follow up with her as soon as able today, she stated if it gets worse she will present to ED for eval.

## 2014-02-11 NOTE — ED Provider Notes (Signed)
CSN: [id number]     Arrival date & time 02/11/14  1257 History   First MD Initiated Contact with Patient 02/11/14 1320     Chief Complaint  Patient presents with  . Atrial Fibrillation     (Consider location/radiation/quality/duration/timing/severity/associated sxs/prior Treatment) The history is provided by the patient.  Crystal Brennan is a 48 y.o. female hx of HTN, afib on eliquis and cardizem here with palpitations and chest pain shortness of breath. Symptoms going on for the last 3 days. Worse at night when she tries to get to bed. She's been taking her Cardizem as prescribed. Today has an episode of chest pain associated with palpitations. She was supposed to cardiology but came here because she felt worse. She states that since coming to the ER the palpitations and chest pain resolved.    Past Medical History  Diagnosis Date  . Chronic back pain     "upper" (01/01/2014)  . Hypertension   . Depression   . Anxiety   . Anemia   . Menometrorrhagia   . Insomnia   . Diverticulitis   . Sickle cell trait   . Cardiomegaly     a. 2011 Echo: EF 65-70%, Gr 1 DD, nl wall motion.  . Morbid obesity   . Tobacco abuse   . Palpitations     a. Date back to 08/2013.  . Diverticulitis     a. 09/2013  . Family history of adverse reaction to anesthesia     "Mom gets so sick off of it"  . Heart murmur   . Chronic bronchitis     "get it ~ q yr" (01/01/2014)  . GERD (gastroesophageal reflux disease)   . Headache     "monthly" (01/01/2014)  . Migraine     "monthly" (01/01/2014)  . Arthritis     "qwhere"   Past Surgical History  Procedure Laterality Date  . Cholecystectomy  2009  . Tubal ligation  198  . Cesarean section  1983; 1986; 1988   Family History  Problem Relation Age of Onset  . Heart attack Mother     ? in her 33's - never sought treatment  . Pulmonary embolism Mother   . Other Father     ? hx  . Lupus Sister   . Heart failure Sister    History  Substance Use Topics   . Smoking status: Former Smoker -- 0.33 packs/day for 30 years    Types: Cigarettes    Quit date: 12/31/2013  . Smokeless tobacco: Not on file     Comment: pt smoke electronic cigarette  . Alcohol Use: 1.8 oz/week    0 Not specified, 3 Glasses of wine per week   OB History    No data available     Review of Systems  Cardiovascular: Positive for palpitations.  All other systems reviewed and are negative.     Allergies  Benadryl; Codeine; Dilaudid; and Percocet  Home Medications   Prior to Admission medications   Medication Sig Start Date End Date Taking? Authorizing Provider  apixaban (ELIQUIS) 5 MG TABS tablet Take 1 tablet (5 mg total) by mouth 2 (two) times daily. 01/19/14  Yes Brett Canales, PA-C  diltiazem (CARDIZEM CD) 180 MG 24 hr capsule Take 1 capsule (180 mg total) by mouth daily. 01/02/14  Yes Brett Canales, PA-C  hydrochlorothiazide (HYDRODIURIL) 25 MG tablet Take 25 mg by mouth daily.   Yes Historical Provider, MD  ibuprofen (ADVIL,MOTRIN) 400 MG tablet Take 400  mg by mouth every 6 (six) hours as needed for cramping.   Yes Historical Provider, MD   BP 124/57 mmHg  Pulse 74  Temp(Src) 98.1 F (36.7 C) (Oral)  Resp 22  SpO2 98%  LMP 01/14/2014 (Approximate) Physical Exam  Constitutional: She is oriented to person, place, and time.  Anxious   HENT:  Head: Normocephalic.  Mouth/Throat: Oropharynx is clear and moist.  Eyes: Conjunctivae are normal. Pupils are equal, round, and reactive to light.  Neck: Normal range of motion. Neck supple.  Cardiovascular: Normal rate, regular rhythm and normal heart sounds.   Pulmonary/Chest: Effort normal and breath sounds normal. No respiratory distress. She has no wheezes. She has no rales.  Abdominal: Soft. Bowel sounds are normal. She exhibits no distension. There is no tenderness. There is no rebound.  Musculoskeletal: Normal range of motion. She exhibits no edema or tenderness.  Neurological: She is alert and oriented  to person, place, and time. No cranial nerve deficit. Coordination normal.  Skin: Skin is warm and dry.  Psychiatric: She has a normal mood and affect. Her behavior is normal. Judgment and thought content normal.  Nursing note and vitals reviewed.   ED Course  Procedures (including critical care time) Labs Review Labs Reviewed  BASIC METABOLIC PANEL - Abnormal; Notable for the following:    Potassium 3.4 (*)    Glucose, Bld 135 (*)    GFR calc non Af Amer 70 (*)    GFR calc Af Amer 81 (*)    All other components within normal limits  CBC  BRAIN NATRIURETIC PEPTIDE  I-STAT TROPOININ, ED    Imaging Review Dg Chest 2 View  02/11/2014   CLINICAL DATA:  Chest pain. Pt states she has been experiencing A fib since Monday. Pt was hospitalized with a fib x 1 month ago. Pt's a fib symptoms worsened earlier today so she came into the ED. The ED nurses state she was not currently going through a fib while in the ED. Hx of 1/2 pack of cigarettes per day for years until a month ago, pt began smoking the E-cigarettes x 1 month ago. Hx of HTN controlled with medication, and enlarged heart. Pt currently taking Eliquis and Diltiazem.  EXAM: CHEST  2 VIEW  COMPARISON:  12/27/2009  FINDINGS: Heart is mildly enlarged. There are no focal consolidations or pleural effusions. No pulmonary edema. Degenerative changes are seen in the upper thoracic spine. Surgical clips are present in the right upper quadrant of the abdomen.  IMPRESSION: Cardiomegaly.  No evidence for acute cardiopulmonary abnormality.   Electronically Signed   By: Shon Hale M.D.   On: 02/11/2014 13:45     EKG Interpretation   Date/Time:  Wednesday February 11 2014 13:06:44 EST Ventricular Rate:  84 PR Interval:  144 QRS Duration: 62 QT Interval:  384 QTC Calculation: 453 R Axis:   89 Text Interpretation:  Normal sinus rhythm Normal ECG previous EKG showed  afib Confirmed by Kaleth Koy  MD, Lizeth Bencosme ([id number]) on 02/11/2014 1:21:11 PM      MDM    Final diagnoses:  Chest pain    Crystal Brennan is a 48 y.o. female here with chest pain, palpitations. Now back in sinus. Will get labs, consult cardiology.   3:34 PM Cardiology saw patient. Dr. Claiborne Billings recommend add metoprolol 25 mg BID and continue cardizem for symptomatic management. Has been in sinus rhythm in the ED.      Wandra Arthurs, MD 02/11/14 [phone]

## 2014-02-23 ENCOUNTER — Other Ambulatory Visit: Payer: Self-pay | Admitting: Physician Assistant

## 2014-02-23 NOTE — Telephone Encounter (Signed)
Pt said somebody called,but she missed the call.She needs to know if we have samples please.She goes back to lunch at 12:30,she can not answer her phone at all times.

## 2014-02-23 NOTE — Telephone Encounter (Signed)
Pt would like some samples of Eliquis please.Pt need them today if possible, she took her last one this morning.

## 2014-02-23 NOTE — Telephone Encounter (Signed)
Spoke with pt, samples of eliquis 2.5 mg samples placed at the front desk. Patient voiced understanding to take 2 tablets bid.

## 2014-02-28 NOTE — Progress Notes (Signed)
HPI: FU atrial fibrillation; admitted 11/15 with new onset atrial fibrillation; TSH normal. Lexiscan cardiolite was completed and negative for ischemia; EF 78. Echocardiogram revealed an EF of 65-70% with normal wall motion. Mild concentric Hypertrophy. G1DD. Peak PA pressure of 25mmHg. Converted with cardizem. Since last seen, she has some dyspnea on exertion. No chest pain. She has had occasional palpitations that she feels is consistent with atrial fibrillation. She was recently seen in the emergency room and metoprolol added with some improvement.  Current Outpatient Prescriptions  Medication Sig Dispense Refill  . apixaban (ELIQUIS) 5 MG TABS tablet Take 1 tablet (5 mg total) by mouth 2 (two) times daily. 60 tablet 0  . diltiazem (CARDIZEM CD) 180 MG 24 hr capsule Take 1 capsule (180 mg total) by mouth daily. 30 capsule 11  . hydrochlorothiazide (HYDRODIURIL) 25 MG tablet Take 25 mg by mouth daily.    Marland Kitchen ibuprofen (ADVIL,MOTRIN) 400 MG tablet Take 400 mg by mouth every 6 (six) hours as needed for cramping.    . metoprolol (LOPRESSOR) 25 MG tablet Take 1 tablet (25 mg total) by mouth 2 (two) times daily. 30 tablet 0   No current facility-administered medications for this visit.     Past Medical History  Diagnosis Date  . Chronic back pain     "upper" (01/01/2014)  . Hypertension   . Depression   . Anxiety   . Anemia   . Menometrorrhagia   . Insomnia   . Diverticulitis   . Sickle cell trait   . Cardiomegaly     a. 2011 Echo: EF 65-70%, Gr 1 DD, nl wall motion.  . Morbid obesity   . Tobacco abuse   . Palpitations     a. Date back to 08/2013.  . Diverticulitis     a. 09/2013  . Family history of adverse reaction to anesthesia     "Mom gets so sick off of it"  . Heart murmur   . Chronic bronchitis     "get it ~ q yr" (01/01/2014)  . GERD (gastroesophageal reflux disease)   . Headache     "monthly" (01/01/2014)  . Migraine     "monthly" (01/01/2014)  . Arthritis     "qwhere"    Past Surgical History  Procedure Laterality Date  . Cholecystectomy  2009  . Tubal ligation  198  . Cesarean section  4128; 1986; 1988    History   Social History  . Marital Status: Single    Spouse Name: N/A    Number of Children: N/A  . Years of Education: N/A   Occupational History  . Not on file.   Social History Main Topics  . Smoking status: Former Smoker -- 0.33 packs/day for 30 years    Types: Cigarettes    Quit date: 12/31/2013  . Smokeless tobacco: Not on file     Comment: pt smoke electronic cigarette  . Alcohol Use: 1.8 oz/week    0 Not specified, 3 Glasses of wine per week  . Drug Use: No  . Sexual Activity: Not on file   Other Topics Concern  . Not on file   Social History Narrative   Lives in [place] with fiance.  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  Walks regularly.  Trying to lose weight.    ROS: no fevers or chills, productive cough, hemoptysis, dysphasia, odynophagia, melena, hematochezia, dysuria, hematuria, rash, seizure activity, orthopnea, PND, pedal edema, claudication. Remaining systems are negative.  Physical Exam: Well-developed obese in no acute distress.  Skin is warm and dry.  HEENT is normal.  Neck is supple.  Chest is clear to auscultation with normal expansion.  Cardiovascular exam is regular rate and rhythm.  Abdominal exam nontender or distended. No masses palpated. Extremities show no edema. neuro grossly intact     Electrocardiogram shows sinus rhythm at a rate of 74. Nonspecific ST changes.

## 2014-03-02 ENCOUNTER — Encounter: Payer: Self-pay | Admitting: Cardiology

## 2014-03-02 ENCOUNTER — Ambulatory Visit (INDEPENDENT_AMBULATORY_CARE_PROVIDER_SITE_OTHER): Payer: Self-pay | Admitting: Cardiology

## 2014-03-02 VITALS — BP 138/92 | HR 74 | Ht 60.0 in | Wt 239.2 lb

## 2014-03-02 DIAGNOSIS — I480 Paroxysmal atrial fibrillation: Secondary | ICD-10-CM

## 2014-03-02 DIAGNOSIS — Z720 Tobacco use: Secondary | ICD-10-CM

## 2014-03-02 DIAGNOSIS — I517 Cardiomegaly: Secondary | ICD-10-CM

## 2014-03-02 MED ORDER — METOPROLOL TARTRATE 50 MG PO TABS: 50.0000 mg | ORAL_TABLET | Freq: Two times a day (BID) | ORAL | Status: DC

## 2014-03-02 NOTE — Assessment & Plan Note (Signed)
Patient in sinus rhythm today. Embolic risk factors of hypertension and female sex. Continue apixaban. Continue Cardizem. Increase metoprolol to 50 mg twice a day. If she has more frequent episodes in the future we will consider an antiarrhythmic.

## 2014-03-02 NOTE — Patient Instructions (Signed)
Your physician recommends that you schedule a follow-up appointment in: 4 MONTHS WITH DR CRENSHAW  INCREASE METOPROLOL TO 50 MG TWICE DAILY  REFERRAL TO PULMONARY FOR SLEEP APNEA

## 2014-03-02 NOTE — Assessment & Plan Note (Signed)
Patient counseled on discontinuing. 

## 2014-03-02 NOTE — Assessment & Plan Note (Signed)
We'll refer patient for sleep study to rule out sleep apnea.

## 2014-03-02 NOTE — Assessment & Plan Note (Signed)
Blood pressure elevated.increase metoprolol to 25 mg by mouth twice a day.

## 2014-03-03 ENCOUNTER — Telehealth: Payer: Self-pay | Admitting: *Deleted

## 2014-03-03 NOTE — Telephone Encounter (Signed)
Patient assistance for eliquis faxed.

## 2014-03-09 ENCOUNTER — Telehealth: Payer: Self-pay | Admitting: Cardiology

## 2014-03-09 NOTE — Telephone Encounter (Signed)
Pt called in wanting some samples of Eliquis. Please call if we have any available.   Thanks

## 2014-03-10 MED ORDER — APIXABAN 5 MG PO TABS: 5.0000 mg | ORAL_TABLET | Freq: Two times a day (BID) | ORAL | Status: DC

## 2014-03-10 NOTE — Telephone Encounter (Signed)
Samples of this drug were given to the patient, quantity 28, Lot Number [id number]  Left message notifying patient

## 2014-03-12 ENCOUNTER — Other Ambulatory Visit: Payer: Self-pay | Admitting: *Deleted

## 2014-03-12 MED ORDER — APIXABAN 5 MG PO TABS: 5.0000 mg | ORAL_TABLET | Freq: Two times a day (BID) | ORAL | Status: DC

## 2014-03-29 ENCOUNTER — Emergency Department (HOSPITAL_COMMUNITY): Payer: Self-pay

## 2014-03-29 ENCOUNTER — Emergency Department (HOSPITAL_COMMUNITY)
Admission: EM | Admit: 2014-03-29 | Discharge: 2014-03-29 | Disposition: A | Discharge status: Home / Self Care | Payer: Self-pay | Attending: Emergency Medicine | Admitting: Emergency Medicine

## 2014-03-29 ENCOUNTER — Encounter (HOSPITAL_COMMUNITY): Payer: Self-pay | Admitting: Emergency Medicine

## 2014-03-29 DIAGNOSIS — Z87891 Personal history of nicotine dependence: Secondary | ICD-10-CM | POA: Insufficient documentation

## 2014-03-29 DIAGNOSIS — G8929 Other chronic pain: Secondary | ICD-10-CM | POA: Insufficient documentation

## 2014-03-29 DIAGNOSIS — K5732 Diverticulitis of large intestine without perforation or abscess without bleeding: Secondary | ICD-10-CM | POA: Insufficient documentation

## 2014-03-29 DIAGNOSIS — Z8659 Personal history of other mental and behavioral disorders: Secondary | ICD-10-CM | POA: Insufficient documentation

## 2014-03-29 DIAGNOSIS — Z79899 Other long term (current) drug therapy: Secondary | ICD-10-CM | POA: Insufficient documentation

## 2014-03-29 DIAGNOSIS — Z8709 Personal history of other diseases of the respiratory system: Secondary | ICD-10-CM | POA: Insufficient documentation

## 2014-03-29 DIAGNOSIS — Z8742 Personal history of other diseases of the female genital tract: Secondary | ICD-10-CM | POA: Insufficient documentation

## 2014-03-29 DIAGNOSIS — R011 Cardiac murmur, unspecified: Secondary | ICD-10-CM | POA: Insufficient documentation

## 2014-03-29 DIAGNOSIS — G43909 Migraine, unspecified, not intractable, without status migrainosus: Secondary | ICD-10-CM | POA: Insufficient documentation

## 2014-03-29 DIAGNOSIS — Z862 Personal history of diseases of the blood and blood-forming organs and certain disorders involving the immune mechanism: Secondary | ICD-10-CM | POA: Insufficient documentation

## 2014-03-29 DIAGNOSIS — I10 Essential (primary) hypertension: Secondary | ICD-10-CM | POA: Insufficient documentation

## 2014-03-29 DIAGNOSIS — Z8739 Personal history of other diseases of the musculoskeletal system and connective tissue: Secondary | ICD-10-CM | POA: Insufficient documentation

## 2014-03-29 DIAGNOSIS — Z9851 Tubal ligation status: Secondary | ICD-10-CM | POA: Insufficient documentation

## 2014-03-29 LAB — COMPREHENSIVE METABOLIC PANEL
ALT: 11 U/L (ref 0–35)
AST: 16 U/L (ref 0–37)
Albumin: 3.3 g/dL — ABNORMAL LOW (ref 3.5–5.2)
Alkaline Phosphatase: 53 U/L (ref 39–117)
Anion gap: 5 (ref 5–15)
BUN: 11 mg/dL (ref 6–23)
CO2: 25 mmol/L (ref 19–32)
CREATININE: 0.8 mg/dL (ref 0.50–1.10)
Calcium: 8.9 mg/dL (ref 8.4–10.5)
Chloride: 107 mmol/L (ref 96–112)
GFR calc Af Amer: >90 mL/min (ref >90)
GFR, EST NON AFRICAN AMERICAN: 85 mL/min — AB (ref >90)
GLUCOSE: 156 mg/dL — AB (ref 70–99)
POTASSIUM: 3.4 mmol/L — AB (ref 3.5–5.1)
SODIUM: 137 mmol/L (ref 135–145)
Total Bilirubin: 0.6 mg/dL (ref 0.3–1.2)
Total Protein: 6.6 g/dL (ref 6.0–8.3)

## 2014-03-29 LAB — CBC WITH DIFFERENTIAL/PLATELET
Basophils Absolute: 0 10*3/uL (ref 0.0–0.1)
Basophils Relative: 0 % (ref 0–1)
EOS ABS: 0.4 10*3/uL (ref 0.0–0.7)
EOS PCT: 4 % (ref 0–5)
HCT: 35 % — ABNORMAL LOW (ref 36.0–46.0)
HEMOGLOBIN: 11.8 g/dL — AB (ref 12.0–15.0)
LYMPHS PCT: 24 % (ref 12–46)
Lymphs Abs: 2.2 10*3/uL (ref 0.7–4.0)
MCH: 30.3 pg (ref 26.0–34.0)
MCHC: 33.7 g/dL (ref 30.0–36.0)
MCV: 90 fL (ref 78.0–100.0)
MONO ABS: 0.4 10*3/uL (ref 0.1–1.0)
Monocytes Relative: 4 % (ref 3–12)
NEUTROS PCT: 68 % (ref 43–77)
Neutro Abs: 5.9 10*3/uL (ref 1.7–7.7)
Platelets: 379 10*3/uL (ref 150–400)
RBC: 3.89 MIL/uL (ref 3.87–5.11)
RDW: 14.6 % (ref 11.5–15.5)
WBC: 8.9 10*3/uL (ref 4.0–10.5)

## 2014-03-29 LAB — URINALYSIS, ROUTINE W REFLEX MICROSCOPIC
Bilirubin Urine: NEGATIVE
GLUCOSE, UA: NEGATIVE mg/dL
Hgb urine dipstick: NEGATIVE
KETONES UR: NEGATIVE mg/dL
LEUKOCYTES UA: NEGATIVE
NITRITE: NEGATIVE
Protein, ur: NEGATIVE mg/dL
Specific Gravity, Urine: 1.019 (ref 1.005–1.030)
Urobilinogen, UA: 0.2 mg/dL (ref 0.0–1.0)
pH: 6 (ref 5.0–8.0)

## 2014-03-29 LAB — LIPASE, BLOOD: Lipase: 20 U/L (ref 11–59)

## 2014-03-29 MED ORDER — CIPROFLOXACIN HCL 500 MG PO TABS: 500.0000 mg | ORAL_TABLET | Freq: Two times a day (BID) | ORAL | Status: DC

## 2014-03-29 MED ORDER — MORPHINE SULFATE 4 MG/ML IJ SOLN
4.0000 mg | Freq: Once | INTRAMUSCULAR | Status: AC
  Administered 2014-03-29: 4 mg via INTRAVENOUS
  Filled 2014-03-29: qty 1

## 2014-03-29 MED ORDER — IOHEXOL 300 MG/ML  SOLN
100.0000 mL | Freq: Once | INTRAMUSCULAR | Status: AC | PRN
  Administered 2014-03-29: 100 mL via INTRAVENOUS

## 2014-03-29 MED ORDER — HYDROCODONE-ACETAMINOPHEN 5-325 MG PO TABS: 1.0000 | ORAL_TABLET | ORAL | Status: DC | PRN

## 2014-03-29 MED ORDER — METRONIDAZOLE 500 MG PO TABS: 500.0000 mg | ORAL_TABLET | Freq: Three times a day (TID) | ORAL | Status: DC

## 2014-03-29 MED ORDER — CIPROFLOXACIN HCL 500 MG PO TABS
500.0000 mg | ORAL_TABLET | Freq: Once | ORAL | Status: AC
  Administered 2014-03-29: 500 mg via ORAL
  Filled 2014-03-29: qty 1

## 2014-03-29 MED ORDER — IOHEXOL 300 MG/ML  SOLN
50.0000 mL | Freq: Once | INTRAMUSCULAR | Status: AC | PRN
  Administered 2014-03-29: 50 mL via ORAL

## 2014-03-29 MED ORDER — METRONIDAZOLE 500 MG PO TABS
500.0000 mg | ORAL_TABLET | Freq: Once | ORAL | Status: AC
  Administered 2014-03-29: 500 mg via ORAL
  Filled 2014-03-29: qty 1

## 2014-03-29 MED ORDER — ONDANSETRON HCL 4 MG/2ML IJ SOLN
4.0000 mg | Freq: Once | INTRAMUSCULAR | Status: AC
  Administered 2014-03-29: 4 mg via INTRAVENOUS
  Filled 2014-03-29: qty 2

## 2014-03-29 NOTE — ED Notes (Signed)
Pt family member returned to desk and asked when the DON would be here tomorrow. Pt family was advised that they would be here between 33 and 9 am. Pt family extremely hateful to staff when speaking.

## 2014-03-29 NOTE — Discharge Instructions (Signed)
cipro and flagyl for infection until all gone. Norco for severe pain. Follow up with primary care doctor. If you do not have one, see resources below or try wellness center. Return if worsening.    Diverticulitis Diverticulitis is inflammation or infection of small pouches in your colon that form when you have a condition called diverticulosis. The pouches in your colon are called diverticula. Your colon, or large intestine, is where water is absorbed and stool is formed. Complications of diverticulitis can include:  Bleeding.  Severe infection.  Severe pain.  Perforation of your colon.  Obstruction of your colon. CAUSES  Diverticulitis is caused by bacteria. Diverticulitis happens when stool becomes trapped in diverticula. This allows bacteria to grow in the diverticula, which can lead to inflammation and infection. RISK FACTORS People with diverticulosis are at risk for diverticulitis. Eating a diet that does not include enough fiber from fruits and vegetables may make diverticulitis more likely to develop. SYMPTOMS  Symptoms of diverticulitis may include:  Abdominal pain and tenderness. The pain is normally located on the left side of the abdomen, but may occur in other areas.  Fever and chills.  Bloating.  Cramping.  Nausea.  Vomiting.  Constipation.  Diarrhea.  Blood in your stool. DIAGNOSIS  Your health care provider will ask you about your medical history and do a physical exam. You may need to have tests done because many medical conditions can cause the same symptoms as diverticulitis. Tests may include:  Blood tests.  Urine tests.  Imaging tests of the abdomen, including X-rays and CT scans. When your condition is under control, your health care provider may recommend that you have a colonoscopy. A colonoscopy can show how severe your diverticula are and whether something else is causing your symptoms. TREATMENT  Most cases of diverticulitis are mild and  can be treated at home. Treatment may include:  Taking over-the-counter pain medicines.  Following a clear liquid diet.  Taking antibiotic medicines by mouth for 7-10 days. More severe cases may be treated at a hospital. Treatment may include:  Not eating or drinking.  Taking prescription pain medicine.  Receiving antibiotic medicines through an IV tube.  Receiving fluids and nutrition through an IV tube.  Surgery. HOME CARE INSTRUCTIONS   Follow your health care provider's instructions carefully.  Follow a full liquid diet or other diet as directed by your health care provider. After your symptoms improve, your health care provider may tell you to change your diet. He or she may recommend you eat a high-fiber diet. Fruits and vegetables are good sources of fiber. Fiber makes it easier to pass stool.  Take fiber supplements or probiotics as directed by your health care provider.  Only take medicines as directed by your health care provider.  Keep all your follow-up appointments. SEEK MEDICAL CARE IF:   Your pain does not improve.  You have a hard time eating food.  Your bowel movements do not return to normal. SEEK IMMEDIATE MEDICAL CARE IF:   Your pain becomes worse.  Your symptoms do not get better.  Your symptoms suddenly get worse.  You have a fever.  You have repeated vomiting.  You have bloody or black, tarry stools. MAKE SURE YOU:   Understand these instructions.  Will watch your condition.  Will get help right away if you are not doing well or get worse. Document Released: 11/09/2004 Document Revised: 02/04/2013 Document Reviewed: 12/25/2012 [facility] Patient Information 2015 [place], [place]. This information is not intended to  replace advice given to you by your health care provider. Make sure you discuss any questions you have with your health care provider.   Emergency Department Resource Guide 1) Find a Doctor and Pay Out of Pocket Although you  won't have to find out who is covered by your insurance plan, it is a good idea to ask around and get recommendations. You will then need to call the office and see if the doctor you have chosen will accept you as a new patient and what types of options they offer for patients who are self-pay. Some doctors offer discounts or will set up payment plans for their patients who do not have insurance, but you will need to ask so you aren't surprised when you get to your appointment.  2) Contact Your Local Health Department Not all health departments have doctors that can see patients for sick visits, but many do, so it is worth a call to see if yours does. If you don't know where your local health department is, you can check in your phone book. The CDC also has a tool to help you locate your state's health department, and many state websites also have listings of all of their local health departments.  3) Find a [facility] If your illness is not likely to be very severe or complicated, you may want to try a walk in clinic. These are popping up all over the country in pharmacies, drugstores, and shopping centers. They're usually staffed by nurse practitioners or physician assistants that have been trained to treat common illnesses and complaints. They're usually fairly quick and inexpensive. However, if you have serious medical issues or chronic medical problems, these are probably not your best option.  No Primary Care Doctor: - Call Health Connect at  [phone] - they can help you locate a primary care doctor that  accepts your insurance, provides certain services, etc. - Physician Referral Service- [phone]  Chronic Pain Problems: Organization         Address  Phone   Notes  [facility]  [phone] Patients need to be referred by their primary care doctor.   Medication Assistance: Organization         Address  Phone   Notes  [facility] Medication [facility] [address] [place], [place] [address] [phone] --Must be a resident of [facility] -- Must have NO insurance coverage whatsoever (no Medicaid/ Medicare, etc.) -- The pt. MUST have a primary care doctor that directs their care regularly and follows them in the community   MedAssist  [phone]   [employer]  [phone]    Agencies that provide inexpensive medical care: Organization         Address  Phone   Notes  [place]  [phone]   [facility]    [phone]   [facility] [place], [place] [address] [phone]   [place] [address] [place] [phone]   Planned Parenthood    [phone]   [facility]    [phone]   [place] and [place] [address], [place] Phone:  [phone], Fax:  [phone] Hours of Operation:  9 am - 6 pm, M-F.  Also accepts Medicaid/Medicare and self-pay.  [facility] for [place] [address], [employer] Phone: [phone], Fax: [phone]. Hours of Operation:  8:30 am - 5:30 pm, M-F.  Also accepts Medicaid and self-pay.  [facility] [place] [address], [place] Phone: [phone]   [place], [place], [place] [phone], Ext. [phone] Mondays & Thursdays: 7-9 AM.  First 15 patients are seen on a first come, first serve basis.    [place] Providers:  Organization         Address  Phone   Notes  [facility] [address], [place] [phone] Also accepts self-pay patients.  [facility] [address], [place]  [phone]   [place], [address], [place] [phone]   [facility] [address] [place] [phone]   Lucianne Lei [address], [place]   [phone] Only accepts [place] Access [place] patients after they have their name applied to their card.   Self-Pay (no insurance) in [facility]:  Organization         Address  Phone   Notes  Sickle Cell Patients, [facility] [place] [phone]   [facility] [place] [phone]   [facility] [place]  [place], [place], [place] [phone]   [facility]/Dr. Osei-Bonsu  [address] [place] or [address], [place] [phone] Phone number for both [place] and [place] locations is the same.  [facility]gent [facility] and [facility] [address] [place] [phone]   [facility] [address], [place] or [address] [phone]   [facility] [address], [place] [phone], phone; [phone], fax Sees patients 1st and 3rd Saturday of every month.  Must not qualify for public or private insurance (i.e. Medicaid, Medicare, [place] Health Choice, Veterans' Benefits)  Household income should be no more than 200% of the poverty level The clinic cannot treat you if you are pregnant or think you are pregnant  Sexually transmitted diseases are not treated at the clinic.    Dental Care: Organization         Address  Phone  Notes  [facility] of [facility] [place] [phone] Accepts children up to age 30 who are enrolled in [place] or [place]; pregnant women with a Medicaid card; and children who have applied for Medicaid or [place] Health Choice, but were declined, whose parents can pay a reduced fee at time of service.  [facility] Department of [facility]  [address], [place] [phone] Accepts  children up to age 3 who are enrolled in [place] or [place]; pregnant women with a Medicaid card; and children who have applied for Medicaid or [place] Health Choice, but were declined, whose parents can pay a reduced fee at time of service.  [facility] Access PRO[facility]  [place] [phone] Patients are seen by appointment only. Walk-ins are not accepted. [place] will see patients 57 years of age and older. Monday - Tuesday (8am-5pm) Most Wednesdays (8:30-5pm) $30 per visit, cash only  [place] [facility] Access [facility]  [address],  [place] [phone] Patients are seen by appointment only. Walk-ins are not accepted. [place] will see patients 46 years of age and older. One Wednesday Evening (Monthly: Volunteer Based).  $30 per visit, cash only  [place]  [phone] for adults; Children under age 11, call Graduate Pediatric Dentistry at [phone]. Children aged 59-14, please call [phone] to request a pediatric application.  Dental services are provided in all areas of dental care including fillings, crowns and bridges, complete and partial dentures, implants, gum treatment, root canals, and extractions. Preventive care is also provided. Treatment is provided to both adults and children. Patients are selected via a lottery and there is often a waiting list.   [facility] [address], [place]  [phone] [email or web address]   [facility] [address] [place], [place] [phone], Ext. [phone] Second and Fourth Thursday of each month, opens at 6:30 AM; Clinic ends at 9 AM.  Patients are seen on a first-come first-served basis, and a limited number are seen during each clinic.   [facility]  [address] Hillard Danker [place], [place] [phone]   Eligibility Requirements You must have lived in [place], [place], or [place] counties for at least the  last three months.   You cannot be eligible for state or federal sponsored [employer], including [employer], [place], or [employer].   You generally cannot be eligible for healthcare insurance through your employer.    How to apply: Eligibility screenings are held every Tuesday and Wednesday afternoon from 1:00 pm until 4:00 pm. You do not need an appointment for the interview!  [facility] [address], [place], [place]   [place]  [place] Department  [place]  [phone]    Behavioral Health Resources in the Community: Intensive Outpatient Programs Organization         Address  Phone  Notes  [place]. [address] [place], [place] [phone]   [facility] Outpatient [address] [place], [place]   ADS: Alcohol & Drug Svcs [address], [place], [place]   [place] [address],  [place], [place] or [phone]   Substance Abuse Resources Organization         Address  Phone  Notes  Alcohol and Drug Services  [phone]   [place]  [phone]   [facility] [place]   Chinita Pester  [phone]   Residential & Outpatient Substance Abuse Program  [phone]   Psychological Services Organization         Address  Phone  Notes  [facility] [place]  [place]  [phone]   [place] [address], [place] or [phone]    Mobile Crisis Teams Organization         Address  Phone  Notes  [facility]rapeutic Alternatives, Mobile Crisis Care Unit  [phone]   [facility]ive Psychotherapeutic Services  [address] [place], [place]   [place] [address], [place] [phone]     Self-Help/Support Groups Organization         Address  Phone             Notes  [place]. of [place] - variety of support  groups  [phone] Call for more information  Narcotics Anonymous (NA), [facility]aring [facility] [address], [employer] [place]  2 meetings at this location   Residential Facilities manager         Address  Phone  Notes  [facility] [place],    [place]  [phone]   [facility]  [address], [place] [id number], [place], [place]   [place], [place] [phone] Admissions: 8am-3pm M-F  Incentives Substance [place] [address],    [place], [place] [phone]   [facility] [address] [place], [place], [place]   [facility] [address]  [place], [place]   [facility] Programs - Intensive Outpatient [address]een [address], [place], [place]   [facility] ([place].) [place].,  [place], [place] [phone] or [phone]   [facility]idential Treatment Services (RTS) [address] [place], [place] Accepts Medicaid  [place] [address]  [place] [phone] Substance Abuse/Addiction Treatment   [facility] Organization         Address  Phone  Notes  [facility]  [phone]   Domenic Schwab, PhD [address] Arlis Porta [place], [place]   [phone] or [phone]   [place], [place] [phone]   [facility] [address] [place], [place] [phone] Insurance/Medicaid/sponsorship through [facility] and Families [address], [address]                                    [place], [place] [phone] [place] [address], [place] [phone]    Dr. Adele Schilder  [phone]   Free  [facility] [place] Dept. 1) [address] [place] 2) [place] 3)  [place] [address], [place] [phone]  [phone]   [place] [phone] or [phone] (After Hours)

## 2014-03-29 NOTE — ED Notes (Addendum)
Per pt, states abdominal pain that started last week-history of diverticulitis-patient falling asleep during triage

## 2014-03-29 NOTE — ED Notes (Signed)
Pt family member requests to see Agricultural consultant. Charge RN notified to speak with family.

## 2014-03-29 NOTE — ED Provider Notes (Signed)
CSN: [id number]     Arrival date & time 03/29/14  1522 History   First MD Initiated Contact with Patient 03/29/14 1552     Chief Complaint  Patient presents with  . Abdominal Pain     (Consider location/radiation/quality/duration/timing/severity/associated sxs/prior Treatment) HPI Crystal Brennan is a 49 y.o. female with hx of chronic back pain, htn, anemia, diverticulitis, presents to ED with complaint of abdominal pain. States pain started 3 wks ago. Gradually worsened. States the last week pain is severe, with nausea. Denies vomiting. No changes in her bowels. No blood in her stool, no black tarry stools. States pain mainly in lower abdomen. No urinary symptoms. No vaginal discharge or bleeding. States no possibility of PID. States pain similar to prior diverticulitis. Denies fever, chills. Has been taking ibuprofen for pain which she states is not helping.   Past Medical History  Diagnosis Date  . Chronic back pain     "upper" (01/01/2014)  . Hypertension   . Depression   . Anxiety   . Anemia   . Menometrorrhagia   . Insomnia   . Diverticulitis   . Sickle cell trait   . Cardiomegaly     a. 2011 Echo: EF 65-70%, Gr 1 DD, nl wall motion.  . Morbid obesity   . Tobacco abuse   . Palpitations     a. Date back to 08/2013.  . Diverticulitis     a. 09/2013  . Family history of adverse reaction to anesthesia     "Mom gets so sick off of it"  . Heart murmur   . Chronic bronchitis     "get it ~ q yr" (01/01/2014)  . GERD (gastroesophageal reflux disease)   . Headache     "monthly" (01/01/2014)  . Migraine     "monthly" (01/01/2014)  . Arthritis     "qwhere"   Past Surgical History  Procedure Laterality Date  . Cholecystectomy  2009  . Tubal ligation  89  . Cesarean section  1983; 1986; 1988   Family History  Problem Relation Age of Onset  . Heart attack Mother     ? in her 15's - never sought treatment  . Pulmonary embolism Mother   . Other Father     ? hx  . Lupus  Sister   . Heart failure Sister    History  Substance Use Topics  . Smoking status: Former Smoker -- 0.33 packs/day for 30 years    Types: Cigarettes    Quit date: 12/31/2013  . Smokeless tobacco: Not on file     Comment: pt smoke electronic cigarette  . Alcohol Use: 1.8 oz/week    0 Standard drinks or equivalent, 3 Glasses of wine per week   OB History    No data available     Review of Systems  Constitutional: Negative for fever and chills.  Respiratory: Negative for cough, chest tightness and shortness of breath.   Cardiovascular: Negative for chest pain, palpitations and leg swelling.  Gastrointestinal: Positive for nausea and abdominal pain. Negative for vomiting and diarrhea.  Genitourinary: Negative for dysuria, flank pain, vaginal bleeding, vaginal discharge, vaginal pain and pelvic pain.  Musculoskeletal: Negative for myalgias, arthralgias, neck pain and neck stiffness.  Skin: Negative for rash.  Neurological: Negative for dizziness, weakness and headaches.  All other systems reviewed and are negative.     Allergies  Benadryl; Codeine; Dilaudid; and Percocet  Home Medications   Prior to Admission medications   Medication Sig Start  Date End Date Taking? Authorizing Provider  apixaban (ELIQUIS) 5 MG TABS tablet Take 1 tablet (5 mg total) by mouth 2 (two) times daily. 03/12/14   Lelon Perla, MD  diltiazem (CARDIZEM CD) 180 MG 24 hr capsule Take 1 capsule (180 mg total) by mouth daily. 01/02/14   Brett Canales, PA-C  hydrochlorothiazide (HYDRODIURIL) 25 MG tablet Take 25 mg by mouth daily.    Historical Provider, MD  ibuprofen (ADVIL,MOTRIN) 400 MG tablet Take 400 mg by mouth every 6 (six) hours as needed for cramping.    Historical Provider, MD  metoprolol (LOPRESSOR) 50 MG tablet Take 1 tablet (50 mg total) by mouth 2 (two) times daily. 03/02/14   Lelon Perla, MD   BP 127/59 mmHg  Pulse 76  Temp(Src) 97.8 F (36.6 C) (Oral)  Resp 16  SpO2 100%  LMP  03/15/2014 Physical Exam  Constitutional: She is oriented to person, place, and time. She appears well-developed and well-nourished. No distress.  HENT:  Head: Normocephalic.  Eyes: Conjunctivae are normal.  Neck: Neck supple.  Cardiovascular: Normal rate, regular rhythm and normal heart sounds.   Pulmonary/Chest: Effort normal and breath sounds normal. No respiratory distress. She has no wheezes. She has no rales.  Abdominal: Soft. Bowel sounds are normal. She exhibits no distension. There is tenderness. There is no rebound.  RUQ, RLQ, LLQ tenderness. No guarding  Musculoskeletal: She exhibits no edema.  Neurological: She is alert and oriented to person, place, and time.  Skin: Skin is warm and dry.  Psychiatric: She has a normal mood and affect. Her behavior is normal.  Nursing note and vitals reviewed.   ED Course  Procedures (including critical care time) Labs Review Labs Reviewed  COMPREHENSIVE METABOLIC PANEL - Abnormal; Notable for the following:    Potassium 3.4 (*)    Glucose, Bld 156 (*)    Albumin 3.3 (*)    GFR calc non Af Amer 85 (*)    All other components within normal limits  CBC WITH DIFFERENTIAL/PLATELET - Abnormal; Notable for the following:    Hemoglobin 11.8 (*)    HCT 35.0 (*)    All other components within normal limits  URINALYSIS, ROUTINE W REFLEX MICROSCOPIC - Abnormal; Notable for the following:    APPearance CLOUDY (*)    All other components within normal limits  LIPASE, BLOOD    Imaging Review Ct Abdomen Pelvis W Contrast  03/29/2014   CLINICAL DATA:  Abdominal pain for 3 weeks. History of diverticulitis.  EXAM: CT ABDOMEN AND PELVIS WITH CONTRAST  TECHNIQUE: Multidetector CT imaging of the abdomen and pelvis was performed using the standard protocol following bolus administration of intravenous contrast.  CONTRAST:  100 mL OMNIPAQUE IOHEXOL 300 MG/ML SOLN, 50 mL OMNIPAQUE IOHEXOL 300 MG/ML SOLN  COMPARISON:  None.  FINDINGS: The lung bases  demonstrate mild dependent atelectasis. Heart size is upper normal. No pleural or pericardial effusion.  The patient is status post cholecystectomy. A few small hepatic cysts are unchanged. The spleen, kidneys, adrenal glands and pancreas all appear normal.  The patient has a tortuous sigmoid colon which extends into the right pelvis. Multiple colonic diverticula are identified. There is wall thickening and pericolonic inflammatory change about the mid sigmoid colon in the right lower quadrant consistent with diverticulitis. No abscess or perforation is noted. The colon is otherwise unremarkable. The appendix is not discretely seen but no pericecal inflammatory process is noted. The stomach and small bowel appear normal. No lymphadenopathy is seen.  Uterus, adnexa and urinary bladder appear normal. No focal bony abnormality is identified.  IMPRESSION: The study is positive for sigmoid diverticulitis without abscess or perforation. The patient has a tortuous sigmoid colon in the in spleen segment colon is in the right lower quadrant.   Electronically Signed   By: Inge Rise M.D.   On: 03/29/2014 18:00     EKG Interpretation None      MDM   Final diagnoses:  Diverticulitis of large intestine without perforation or abscess without bleeding    Pt with lower abdomina pain for 3 weeks, worse in the last week. Will get labs. Pt feels like this is her diverticulitis. Will get CT abd/pelvis for further evaluation. Pain medications ordered.  6:29 PM Blood work with no concerning findings. Ct showing diverticulitis without abscess or perforation. Pt is afebrile. Normal WBC. No vomiting. Will try outpatient tx. Will start cipro and flagyl. norco for pain. Follow up with pcp.   Filed Vitals:   03/29/14 1536 03/29/14 1615 03/29/14 1758  BP: 127/59  134/70  Pulse: 76  77  Temp: 97.8 F (36.6 C)    TempSrc: Oral    Resp: 16 21 20   SpO2: 100%  99%     Renold Genta, PA-C 03/29/14  1832  Tanna Furry, MD 03/29/14 2121

## 2014-03-29 NOTE — ED Notes (Signed)
Per Agricultural consultant, Pt refuses to allow this RN back in room. Advised second RN to take over her care.

## 2014-03-29 NOTE — ED Notes (Signed)
Pt answers questions with hesitation and anger. Pt will not make eye contact with this RN. Pt blowing lips and rolling eyes when RN trying to start IV. Pt was advised that we are trying to help her. Pt sts"I CAN FEEL HOWEVER I WANT TO FEEL!" Pt refusing to answer questions and insists that she wants to speak to Agricultural consultant. Patty, RN notified and talking to pt. Pt continues to be hateful and resistant to tech, pharmacy and other staff.

## 2014-03-29 NOTE — ED Notes (Signed)
Pt unable to provide urine specimen at this time

## 2014-03-31 ENCOUNTER — Telehealth: Payer: Self-pay

## 2014-03-31 NOTE — Telephone Encounter (Signed)
Patient walked in office requesting Eliquis 5 mg samples.Office out of Eliquis samples.Patient stated she is out of Eliquis and has not taken one in 2 days.Stated she has no insurance and cannot afford.She is waiting on patient assistance to see if she qualifies.[employer] office called left message with patient advocates pt needs Eliquis samples call pt back on cell phone [phone].

## 2014-04-01 ENCOUNTER — Telehealth: Payer: Self-pay

## 2014-04-01 NOTE — Telephone Encounter (Signed)
LMTCO.

## 2014-04-01 NOTE — Telephone Encounter (Signed)
Crystal Brennan called on 2.16.16 to get samples of eliquis 2.5 mg for this patient I placed samples up front

## 2014-04-08 ENCOUNTER — Ambulatory Visit (INDEPENDENT_AMBULATORY_CARE_PROVIDER_SITE_OTHER): Payer: Self-pay | Admitting: Pulmonary Disease

## 2014-04-08 ENCOUNTER — Encounter: Payer: Self-pay | Admitting: Pulmonary Disease

## 2014-04-08 ENCOUNTER — Encounter (INDEPENDENT_AMBULATORY_CARE_PROVIDER_SITE_OTHER): Payer: Self-pay

## 2014-04-08 VITALS — BP 110/64 | HR 74 | Ht 64.0 in | Wt 240.6 lb

## 2014-04-08 DIAGNOSIS — G4733 Obstructive sleep apnea (adult) (pediatric): Secondary | ICD-10-CM

## 2014-04-08 NOTE — Patient Instructions (Signed)
Home sleep study Depending on results, you may need CPAP titration in the sleep center

## 2014-04-08 NOTE — Progress Notes (Signed)
Subjective:    Patient ID: Crystal Brennan, female    DOB: 04/08/1965, 49 y.o.   MRN: [id number]  HPI  49 year old obese woman with new onset atrial fibrillation referred by cardiology for evaluation of sleep-disordered breathing. Epworth sleepiness score is 20. Loud snoring and witnessed tosses have been noted by her granddaughter. She reports excessive daytime somnolence and fatigue all day. She works in Therapist, art on an hourly wage. She has difficulty getting through her workday. Bedtime is around midnight, sleep latency is 30-60 minutes, she sleeps on her side with up to 4 pillows propping her up right, reports 2 nocturnal awakenings, and is out of bed by 7 AM feeling tired with dryness of mouth. She always has headaches. She's gained about 20 pounds in the last 2 years. She smokes about 2 cigarettes daily  There is no history suggestive of cataplexy, sleep paralysis or parasomnias  Past Medical History  Diagnosis Date  . Chronic back pain     "upper" (01/01/2014)  . Hypertension   . Depression   . Anxiety   . Anemia   . Menometrorrhagia   . Insomnia   . Diverticulitis   . Sickle cell trait   . Cardiomegaly     a. 2011 Echo: EF 65-70%, Gr 1 DD, nl wall motion.  . Morbid obesity   . Tobacco abuse   . Palpitations     a. Date back to 08/2013.  . Diverticulitis     a. 09/2013  . Family history of adverse reaction to anesthesia     "Mom gets so sick off of it"  . Heart murmur   . Chronic bronchitis     "get it ~ q yr" (01/01/2014)  . GERD (gastroesophageal reflux disease)   . Headache     "monthly" (01/01/2014)  . Migraine     "monthly" (01/01/2014)  . Arthritis     "qwhere"    Past Surgical History  Procedure Laterality Date  . Cholecystectomy  2009  . Tubal ligation  198  . Cesarean section  9937; 1986; 1988    Allergies  Allergen Reactions  . Benadryl [Diphenhydramine Hcl] Hives and Itching  . Codeine Nausea And Vomiting  . Dilaudid [Hydromorphone  Hcl]   . Percocet [Oxycodone-Acetaminophen] Hives and Itching    History   Social History  . Marital Status: Single    Spouse Name: N/A  . Number of Children: N/A  . Years of Education: N/A   Occupational History  . Not on file.   Social History Main Topics  . Smoking status: Former Smoker -- 0.33 packs/day for 30 years    Types: Cigarettes    Quit date: 12/31/2013  . Smokeless tobacco: Not on file     Comment: pt smoke electronic cigarette  . Alcohol Use: 1.8 oz/week    0 Standard drinks or equivalent, 3 Glasses of wine per week  . Drug Use: No  . Sexual Activity: Not on file   Other Topics Concern  . Not on file   Social History Narrative   Lives in [place] with fiance.  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  Walks regularly.  Trying to lose weight.    Family History  Problem Relation Age of Onset  . Heart attack Mother     ? in her 1's - never sought treatment  . Pulmonary embolism Mother   . Other Father     ? hx  . Lupus Sister   .  Heart failure Sister      Review of Systems  Constitutional: Negative for fever and unexpected weight change.  HENT: Negative for congestion, dental problem, ear pain, nosebleeds, postnasal drip, rhinorrhea, sinus pressure, sneezing, sore throat and trouble swallowing.   Eyes: Negative for redness and itching.  Respiratory: Negative for cough, chest tightness, shortness of breath and wheezing.   Cardiovascular: Negative for palpitations and leg swelling.  Gastrointestinal: Negative for nausea and vomiting.  Genitourinary: Negative for dysuria.  Musculoskeletal: Negative for joint swelling.  Skin: Negative for rash.  Neurological: Negative for headaches.  Hematological: Does not bruise/bleed easily.  Psychiatric/Behavioral: Negative for dysphoric mood. The patient is not nervous/anxious.        Objective:   Physical Exam  Gen. Pleasant, obese, in no distress, normal affect ENT - no lesions, no post nasal drip,  class 2-3 airway Neck: No JVD, no thyromegaly, no carotid bruits Lungs: no use of accessory muscles, no dullness to percussion, decreased without rales or rhonchi  Cardiovascular: Rhythm regular, heart sounds  normal, no murmurs or gallops, no peripheral edema Abdomen: soft and non-tender, no hepatosplenomegaly, BS normal. Musculoskeletal: No deformities, no cyanosis or clubbing Neuro:  alert, non focal, no tremors       Assessment & Plan:

## 2014-04-08 NOTE — Assessment & Plan Note (Addendum)
Given excessive daytime somnolence, narrow pharyngeal exam, witnessed apneas & loud snoring, obstructive sleep apnea is very likely & an overnight polysomnogram will be scheduled as a home study. The pathophysiology of obstructive sleep apnea , it's cardiovascular consequences & modes of treatment including CPAP were discused with the patient in detail & they evidenced understanding.  Weight loss encouraged Advised against medications with sedative side effects Cautioned against driving when sleepy - understanding that sleepiness will vary on a day to day basis  Home sleep study Depending on results, you may need CPAP titration in the sleep center We discussed benefits for A fibn

## 2014-04-16 DIAGNOSIS — G4730 Sleep apnea, unspecified: Secondary | ICD-10-CM

## 2014-04-18 ENCOUNTER — Emergency Department (HOSPITAL_COMMUNITY)
Admission: EM | Admit: 2014-04-18 | Discharge: 2014-04-18 | Disposition: A | Discharge status: Home / Self Care | Payer: Self-pay | Attending: Emergency Medicine | Admitting: Emergency Medicine

## 2014-04-18 ENCOUNTER — Encounter (HOSPITAL_COMMUNITY): Payer: Self-pay | Admitting: Emergency Medicine

## 2014-04-18 DIAGNOSIS — M1990 Unspecified osteoarthritis, unspecified site: Secondary | ICD-10-CM | POA: Insufficient documentation

## 2014-04-18 DIAGNOSIS — Z862 Personal history of diseases of the blood and blood-forming organs and certain disorders involving the immune mechanism: Secondary | ICD-10-CM | POA: Insufficient documentation

## 2014-04-18 DIAGNOSIS — M62838 Other muscle spasm: Secondary | ICD-10-CM | POA: Insufficient documentation

## 2014-04-18 DIAGNOSIS — Z87891 Personal history of nicotine dependence: Secondary | ICD-10-CM | POA: Insufficient documentation

## 2014-04-18 DIAGNOSIS — R011 Cardiac murmur, unspecified: Secondary | ICD-10-CM | POA: Insufficient documentation

## 2014-04-18 DIAGNOSIS — Z792 Long term (current) use of antibiotics: Secondary | ICD-10-CM | POA: Insufficient documentation

## 2014-04-18 DIAGNOSIS — G8929 Other chronic pain: Secondary | ICD-10-CM | POA: Insufficient documentation

## 2014-04-18 DIAGNOSIS — Z79899 Other long term (current) drug therapy: Secondary | ICD-10-CM | POA: Insufficient documentation

## 2014-04-18 DIAGNOSIS — Z8742 Personal history of other diseases of the female genital tract: Secondary | ICD-10-CM | POA: Insufficient documentation

## 2014-04-18 DIAGNOSIS — I10 Essential (primary) hypertension: Secondary | ICD-10-CM | POA: Insufficient documentation

## 2014-04-18 DIAGNOSIS — Z8659 Personal history of other mental and behavioral disorders: Secondary | ICD-10-CM | POA: Insufficient documentation

## 2014-04-18 DIAGNOSIS — M25511 Pain in right shoulder: Secondary | ICD-10-CM | POA: Insufficient documentation

## 2014-04-18 MED ORDER — DIAZEPAM 5 MG PO TABS: 5.0000 mg | ORAL_TABLET | Freq: Two times a day (BID) | ORAL | Status: DC

## 2014-04-18 MED ORDER — KETOROLAC TROMETHAMINE 10 MG PO TABS: 10.0000 mg | ORAL_TABLET | Freq: Four times a day (QID) | ORAL | Status: DC | PRN

## 2014-04-18 MED ORDER — DIAZEPAM 5 MG/ML IJ SOLN
5.0000 mg | Freq: Once | INTRAMUSCULAR | Status: AC
  Administered 2014-04-18: 5 mg via INTRAMUSCULAR
  Filled 2014-04-18: qty 2

## 2014-04-18 MED ORDER — KETOROLAC TROMETHAMINE 60 MG/2ML IM SOLN
60.0000 mg | Freq: Once | INTRAMUSCULAR | Status: AC
  Administered 2014-04-18: 60 mg via INTRAMUSCULAR
  Filled 2014-04-18: qty 2

## 2014-04-18 NOTE — Discharge Instructions (Signed)
Take the prescribed medication as directed. May continue to use heat therapy to help with muscle soreness/stiffness. Return to the ED for new or worsening symptoms.

## 2014-04-18 NOTE — ED Provider Notes (Signed)
CSN: [id number]     Arrival date & time 04/18/14  0546 History   First MD Initiated Contact with Patient 04/18/14 0559     Chief Complaint  Patient presents with  . Neck Pain     (Consider location/radiation/quality/duration/timing/severity/associated sxs/prior Treatment) Patient is a 49 y.o. female presenting with neck pain. The history is provided by the patient and medical records.  Neck Pain   This is a 49 year old F with PMH significant for HTN, depression, anxiety, anemia, migraine headaches, presenting to the ED for neck pain. Patient states this has been ongoing for the past 3 months but has been significantly worse over the past 3 days.  Patient denies any known injury, trauma, or falls.  States initially she thought she just had a "crick" in her neck so she bought new pillows which seemed to worsen her pain.  Denies numbness, paresthesias, or weakness of upper extremities.  No associated back pain.  No fever, chills, headache.  Has taken motrin PTA without noted improvement.  Past Medical History  Diagnosis Date  . Chronic back pain     "upper" (01/01/2014)  . Hypertension   . Depression   . Anxiety   . Anemia   . Menometrorrhagia   . Insomnia   . Diverticulitis   . Sickle cell trait   . Cardiomegaly     a. 2011 Echo: EF 65-70%, Gr 1 DD, nl wall motion.  . Morbid obesity   . Tobacco abuse   . Palpitations     a. Date back to 08/2013.  . Diverticulitis     a. 09/2013  . Family history of adverse reaction to anesthesia     "Mom gets so sick off of it"  . Heart murmur   . Chronic bronchitis     "get it ~ q yr" (01/01/2014)  . GERD (gastroesophageal reflux disease)   . Headache     "monthly" (01/01/2014)  . Migraine     "monthly" (01/01/2014)  . Arthritis     "qwhere"   Past Surgical History  Procedure Laterality Date  . Cholecystectomy  2009  . Tubal ligation  198  . Cesarean section  1983; 1986; 1988   Family History  Problem Relation Age of Onset  . Heart  attack Mother     ? in her 44's - never sought treatment  . Pulmonary embolism Mother   . Other Father     ? hx  . Lupus Sister   . Heart failure Sister    History  Substance Use Topics  . Smoking status: Former Smoker -- 0.33 packs/day for 30 years    Types: Cigarettes    Quit date: 12/31/2013  . Smokeless tobacco: Not on file     Comment: pt smoke electronic cigarette  . Alcohol Use: 1.8 oz/week    0 Standard drinks or equivalent, 3 Glasses of wine per week   OB History    No data available     Review of Systems  Musculoskeletal: Positive for neck pain.  All other systems reviewed and are negative.     Allergies  Benadryl; Codeine; Dilaudid; and Percocet  Home Medications   Prior to Admission medications   Medication Sig Start Date End Date Taking? Authorizing Provider  apixaban (ELIQUIS) 5 MG TABS tablet Take 1 tablet (5 mg total) by mouth 2 (two) times daily. 03/12/14  Yes Lelon Perla, MD  diltiazem (CARDIZEM CD) 180 MG 24 hr capsule Take 1 capsule (180 mg  total) by mouth daily. 01/02/14  Yes Brett Canales, PA-C  hydrochlorothiazide (HYDRODIURIL) 25 MG tablet Take 25 mg by mouth daily.   Yes Historical Provider, MD  ibuprofen (ADVIL,MOTRIN) 200 MG tablet Take 800 mg by mouth every 6 (six) hours as needed for headache or moderate pain.   Yes Historical Provider, MD  metoprolol (LOPRESSOR) 50 MG tablet Take 1 tablet (50 mg total) by mouth 2 (two) times daily. 03/02/14  Yes Lelon Perla, MD  metroNIDAZOLE (FLAGYL) 500 MG tablet Take 1 tablet (500 mg total) by mouth 3 (three) times daily. 03/29/14  Yes Tatyana A Kirichenko, PA-C  ciprofloxacin (CIPRO) 500 MG tablet Take 1 tablet (500 mg total) by mouth 2 (two) times daily. Patient not taking: Reported on 04/18/2014 03/29/14   Tatyana A Kirichenko, PA-C   BP 140/85 mmHg  Pulse 90  Temp(Src) 97.7 F (36.5 C) (Oral)  Resp 15  SpO2 100%  LMP 04/12/2014   Physical Exam  Constitutional: She is oriented to person,  place, and time. She appears well-developed and well-nourished.  Heat pack on right side of neck, NAD  HENT:  Head: Normocephalic and atraumatic.  Mouth/Throat: Oropharynx is clear and moist.  Eyes: Conjunctivae and EOM are normal. Pupils are equal, round, and reactive to light.  Neck: Neck supple. Muscular tenderness present. No rigidity.  Neck supple; tenderness of right trapezius with spasm present; limited ROM of neck due to pain; no rigidity; strong carotid pulses bilaterally; no bruits  Cardiovascular: Normal rate, regular rhythm and normal heart sounds.   Pulmonary/Chest: Effort normal and breath sounds normal. No respiratory distress. She has no wheezes.  Musculoskeletal: Normal range of motion.  Neurological: She is alert and oriented to person, place, and time.  AAOx3, answering questions and following commands appropriately; equal strength UE and LE bilaterally; CN grossly intact; moves all extremities appropriately without ataxia; no focal neuro deficits or facial asymmetry appreciated  Skin: Skin is warm and dry.  Psychiatric: She has a normal mood and affect.  Nursing note and vitals reviewed.   ED Course  Procedures (including critical care time) Labs Review Labs Reviewed - No data to display  Imaging Review No results found.   EKG Interpretation None      MDM   Final diagnoses:  Muscle spasms of neck    49 year old female with grossly worsening neck pain for the past 3 weeks. Symptoms seem to worsen after buying new pillows. On exam, patient is afebrile and nontoxic in appearance. She does have noted tenderness of her right trapezius with spasm. She has decreased range of motion of her neck due to pain. She has no focal neurologic deficits that she has strong carotid pulses bilaterally.   No clinical signs of meningitis. Suspect her symptoms are musculoskeletal in nature. Patient given Toradol and Valium with significant improvement of her pain. She states she is  now able to move her neck more freely and is able to get up out of bed unassisted which she was not able to do prior to coming here. Will d/c home with toradol and valium since worked well for her here.  Encouraged to continue heat therapy to help with pain.  Discussed plan with patient, he/she acknowledged understanding and agreed with plan of care.  Return precautions given for new or worsening symptoms.  Larene Pickett, PA-C 04/18/14 [place], MD 04/19/14 [phone]

## 2014-04-18 NOTE — ED Notes (Signed)
Pt reports neck pain x3 days. Pt states she was not doing anything when pain started. Pt reports taking motrin for pain yesterday.

## 2014-04-29 ENCOUNTER — Telehealth: Payer: Self-pay | Admitting: Pulmonary Disease

## 2014-04-29 DIAGNOSIS — G4730 Sleep apnea, unspecified: Secondary | ICD-10-CM

## 2014-04-29 DIAGNOSIS — G4733 Obstructive sleep apnea (adult) (pediatric): Secondary | ICD-10-CM

## 2014-04-29 NOTE — Telephone Encounter (Signed)
Moderate OSA 25/hour Needs CPAP titration study.

## 2014-04-30 ENCOUNTER — Encounter: Payer: Self-pay | Admitting: Pulmonary Disease

## 2014-04-30 ENCOUNTER — Other Ambulatory Visit: Payer: Self-pay | Admitting: *Deleted

## 2014-04-30 DIAGNOSIS — G4733 Obstructive sleep apnea (adult) (pediatric): Secondary | ICD-10-CM

## 2014-04-30 NOTE — Telephone Encounter (Signed)
lmtcb

## 2014-04-30 NOTE — Telephone Encounter (Signed)
[phone] pt cb she is at work and unable to answer it while on the phone, please leave detailed message on vm, she does not get of until 6:30pm

## 2014-04-30 NOTE — Telephone Encounter (Signed)
Left detailed message for pt about sleep study results per Dr Elsworth Soho.  Order placed for cpap titration.

## 2014-05-14 ENCOUNTER — Ambulatory Visit (HOSPITAL_BASED_OUTPATIENT_CLINIC_OR_DEPARTMENT_OTHER): Payer: Self-pay

## 2014-05-19 ENCOUNTER — Ambulatory Visit (HOSPITAL_BASED_OUTPATIENT_CLINIC_OR_DEPARTMENT_OTHER): Payer: Self-pay | Attending: Pulmonary Disease | Admitting: Radiology

## 2014-05-19 VITALS — Ht 62.0 in | Wt 239.0 lb

## 2014-05-19 DIAGNOSIS — G4733 Obstructive sleep apnea (adult) (pediatric): Secondary | ICD-10-CM | POA: Insufficient documentation

## 2014-05-21 ENCOUNTER — Telehealth: Payer: Self-pay | Admitting: Pulmonary Disease

## 2014-05-21 DIAGNOSIS — G4730 Sleep apnea, unspecified: Secondary | ICD-10-CM

## 2014-05-21 DIAGNOSIS — G4733 Obstructive sleep apnea (adult) (pediatric): Secondary | ICD-10-CM

## 2014-05-21 NOTE — Sleep Study (Signed)
[place]  NAME: Crystal Brennan  DATE OF BIRTH: [birth year]  MEDICAL RECORD NUMBER [id number]  LOCATION: [facility]  PHYSICIAN: ALVA,RAKESH V.  DATE OF STUDY:   SLEEP STUDY TYPE: CPAP titration study               REFERRING PHYSICIAN: Rigoberto Noel, MD  INDICATION FOR STUDY: 49 year old with moderate OSA. Home study showed AHI of 25 per hour. At the time of this study ,they weighed 239 pounds with a height of  5 ft 2 inches and the BMI of 44, neck size of 16 inches. Epworth sleepiness score was 23   This CPAP titration polysomnogram was performed with a sleep technologist in attendance. EEG, EOG,EMG and respiratory parameters recorded. Sleep stages, arousals, limb movements and respiratory data was scored according to criteria laid out by the American Academy of sleep medicine.  SLEEP ARCHITECTURE: Lights out was at when he 141 PM and lights on was at 349 AM. Total sleep time was 336 minutes with sleep period time of 355 minutes and sleep efficiency of 92% .Sleep latency was 9 minutes with latency to REM sleep of 37 minutes and wake after sleep onset of 22 minutes.  Sleep stages as a percentage of total sleep time was N1 2 N2- 53 % and REM sleep 45 % ( 151 minutes) . The longest period of REM sleep was around 1 AM.   AROUSAL DATA : There were 48 arousals with an arousal index of 8 events per hour. Of these 46 were spontaneous, and to were associated with respiratory events and 0 were associated periodic limb movements  RESPIRATORY DATA: CPAP was initiated at 5 centimeters and titrated to a final level of 12 centimeters due to respiratory events and snoring. At the final level of 12 centimeters, there were 0 obstructive apneas, 0 central apneas, 0 mixed apneas and 0 hypopneas with apnea -hypopnea index of 0 events per hour.  There was no relation to sleep stage or body position. Titration was optimal.  MOVEMENT/PARASOMNIA: There were 0 PLMS with a  PLM index of 0 events per hour. The PLM arousal index was 0 events per hour.  OXYGEN DATA: The lowest desaturation was 90 % during non-REM sleep and the desaturation index was 3 per hour. The saturations stayed below 88% for 0 minutes.  CARDIAC DATA: The low heart rate was 48 beats per minute. The high heart rate recorded was an artifact. No arrhythmias were noted   IMPRESSION :  1. Moderate obstructive sleep apnea with hypopneas causing sleep fragmentation and mild oxygen desaturation. 2. This was corrected by CPAP of 12 centimeters with a medium fullface mask. Titration was optimal. 3. No evidence of cardiac arrhythmias or behavioral disturbance during sleep. 4. Periodic limb movements were not noted.  RECOMMENDATION:    1. The treatment options for this degree of sleep disordered breathing includes weight loss and/or CPAP therapy. CPAP can be initiated at 12 centimeters with a medium fullface mask and compliance monitored at this level. 2. Patient should be cautioned against driving when sleepy 3. They should be asked to avoid medications with sedative side effects  Rigoberto Noel  MD Diplomate, American Board of Sleep Medicine  ELECTRONICALLY SIGNED ON: 05/21/2014   [facility] PH: [phone]   FX: [phone] [place]

## 2014-05-21 NOTE — Telephone Encounter (Signed)
Unable to leave message - mailbox full, will call back.

## 2014-05-21 NOTE — Telephone Encounter (Signed)
Prescription sent to DME for -CPAP 12 cm with medium fullface mask, download in 4 weeks Please arrange office visit with me or TP in 6 weeks

## 2014-05-22 NOTE — Telephone Encounter (Signed)
OK [facility] can just adjust the CPAP to settings as ordered

## 2014-05-22 NOTE — Telephone Encounter (Signed)
Left message for patient to call back.  Ok to advise patient that we ordered her a CPAP machine and schedule follow up appointment with RA or TP in 6 weeks.

## 2014-05-22 NOTE — Telephone Encounter (Signed)
Spoke with patient in ref to Cpap Assistance Program, patient says she cannot afford the $158 application fee that is required for this program. She has a friend that has a new cpap machine that was his wife who passed away, and he asked her did she want it.  Dr Elsworth Soho patient wants to know if that would be ok? Spoke with [facility]der who happen to be in the office, says they have patients that have machine and they just go off the order that you have hers is cpap 12cm with medium full face mask, download in 4 weeks. Pt says she even has mask. (not sure what kind it is but if ok with you maybe patient can bring with machine to [facility].  Please advise and we can forward this referral to [facility] with comment patient has a machine.  Thanks!  [employer]

## 2014-05-22 NOTE — Telephone Encounter (Signed)
Patient says she cannot afford a CPAP machine, she does not have insurance.  Patient would like to know if she can get a CPAP through Patient assistance.    [place] - what does she need to do to get the CPAP machine through the assistance program?  Please advise.

## 2014-05-26 NOTE — Telephone Encounter (Signed)
Sharyn Lull i suggest you put in an order for [facility] to see if they will set pressure on her cpap(given to her )and any supplies she may need  Thanks Joellen [place]

## 2014-05-26 NOTE — Telephone Encounter (Signed)
Order entered per Libby's request. Nothing further needed.

## 2014-05-26 NOTE — Telephone Encounter (Signed)
Crystal Brennan - do I need to place another order for this?  Please advise.

## 2014-05-29 ENCOUNTER — Encounter (HOSPITAL_COMMUNITY): Payer: Self-pay | Admitting: Emergency Medicine

## 2014-05-29 ENCOUNTER — Emergency Department (HOSPITAL_COMMUNITY): Payer: Self-pay

## 2014-05-29 ENCOUNTER — Emergency Department (HOSPITAL_COMMUNITY)
Admission: EM | Admit: 2014-05-29 | Discharge: 2014-05-29 | Disposition: A | Discharge status: Home / Self Care | Payer: Self-pay | Attending: Emergency Medicine | Admitting: Emergency Medicine

## 2014-05-29 DIAGNOSIS — M791 Myalgia: Secondary | ICD-10-CM | POA: Insufficient documentation

## 2014-05-29 DIAGNOSIS — M1990 Unspecified osteoarthritis, unspecified site: Secondary | ICD-10-CM | POA: Insufficient documentation

## 2014-05-29 DIAGNOSIS — I10 Essential (primary) hypertension: Secondary | ICD-10-CM | POA: Insufficient documentation

## 2014-05-29 DIAGNOSIS — F419 Anxiety disorder, unspecified: Secondary | ICD-10-CM | POA: Insufficient documentation

## 2014-05-29 DIAGNOSIS — Z87891 Personal history of nicotine dependence: Secondary | ICD-10-CM | POA: Insufficient documentation

## 2014-05-29 DIAGNOSIS — Z8742 Personal history of other diseases of the female genital tract: Secondary | ICD-10-CM | POA: Insufficient documentation

## 2014-05-29 DIAGNOSIS — M25511 Pain in right shoulder: Secondary | ICD-10-CM | POA: Insufficient documentation

## 2014-05-29 DIAGNOSIS — R011 Cardiac murmur, unspecified: Secondary | ICD-10-CM | POA: Insufficient documentation

## 2014-05-29 DIAGNOSIS — M7918 Myalgia, other site: Secondary | ICD-10-CM

## 2014-05-29 DIAGNOSIS — F329 Major depressive disorder, single episode, unspecified: Secondary | ICD-10-CM | POA: Insufficient documentation

## 2014-05-29 DIAGNOSIS — G8929 Other chronic pain: Secondary | ICD-10-CM | POA: Insufficient documentation

## 2014-05-29 DIAGNOSIS — Z862 Personal history of diseases of the blood and blood-forming organs and certain disorders involving the immune mechanism: Secondary | ICD-10-CM | POA: Insufficient documentation

## 2014-05-29 DIAGNOSIS — Z7901 Long term (current) use of anticoagulants: Secondary | ICD-10-CM | POA: Insufficient documentation

## 2014-05-29 DIAGNOSIS — G4700 Insomnia, unspecified: Secondary | ICD-10-CM | POA: Insufficient documentation

## 2014-05-29 DIAGNOSIS — Z792 Long term (current) use of antibiotics: Secondary | ICD-10-CM | POA: Insufficient documentation

## 2014-05-29 DIAGNOSIS — Z8719 Personal history of other diseases of the digestive system: Secondary | ICD-10-CM | POA: Insufficient documentation

## 2014-05-29 DIAGNOSIS — Z79899 Other long term (current) drug therapy: Secondary | ICD-10-CM | POA: Insufficient documentation

## 2014-05-29 DIAGNOSIS — G43909 Migraine, unspecified, not intractable, without status migrainosus: Secondary | ICD-10-CM | POA: Insufficient documentation

## 2014-05-29 MED ORDER — DIAZEPAM 5 MG PO TABS
5.0000 mg | ORAL_TABLET | Freq: Once | ORAL | Status: AC
  Administered 2014-05-29: 5 mg via ORAL
  Filled 2014-05-29: qty 1

## 2014-05-29 MED ORDER — DIAZEPAM 5 MG PO TABS: 5.0000 mg | ORAL_TABLET | Freq: Three times a day (TID) | ORAL | Status: DC | PRN

## 2014-05-29 MED ORDER — KETOROLAC TROMETHAMINE 60 MG/2ML IM SOLN
60.0000 mg | Freq: Once | INTRAMUSCULAR | Status: AC
  Administered 2014-05-29: 60 mg via INTRAMUSCULAR
  Filled 2014-05-29: qty 2

## 2014-05-29 MED ORDER — IBUPROFEN 800 MG PO TABS: 800.0000 mg | ORAL_TABLET | Freq: Three times a day (TID) | ORAL | Status: DC

## 2014-05-29 NOTE — ED Notes (Signed)
49 yo female coming from home presents with right shoulder pain that started yesterday.  Pt describes pain as sharp and throbbing and states that pain is rated at "20" when asked to rate pain on 0 to 10 pain scale.  Pt has significant reduction in range of motion and had help putting on clothes and undergarments today.

## 2014-05-29 NOTE — ED Provider Notes (Signed)
CSN: [id number]     Arrival date & time 05/29/14  1524 History   First MD Initiated Contact with Patient 05/29/14 1530     Chief Complaint  Patient presents with  . Shoulder Pain     (Consider location/radiation/quality/duration/timing/severity/associated sxs/prior Treatment) HPI Comments: Had neck pain a few weeks ago that resolved with toradol and valium. Had similar neck pain then it progressed to shoulder pain. R shoulder pain, sharp and throbbing. Radiates into R lateral chest. No SOB, no N/V, no diarrhea. No cough. No fever.  Patient is a 49 y.o. female presenting with shoulder pain. The history is provided by the patient.  Shoulder Pain Location:  Shoulder Injury: no   Shoulder location:  R shoulder Pain details:    Quality:  Aching   Radiates to:  Chest   Severity:  Moderate   Onset quality:  Gradual   Duration:  2 days   Timing:  Constant   Progression:  Unchanged Chronicity:  New Handedness:  Right-handed Dislocation: no   Relieved by:  Nothing Worsened by:  Exercise and movement Ineffective treatments:  None tried Associated symptoms: neck pain   Associated symptoms: no back pain and no fever     Past Medical History  Diagnosis Date  . Chronic back pain     "upper" (01/01/2014)  . Hypertension   . Depression   . Anxiety   . Anemia   . Menometrorrhagia   . Insomnia   . Diverticulitis   . Sickle cell trait   . Cardiomegaly     a. 2011 Echo: EF 65-70%, Gr 1 DD, nl wall motion.  . Morbid obesity   . Tobacco abuse   . Palpitations     a. Date back to 08/2013.  . Diverticulitis     a. 09/2013  . Family history of adverse reaction to anesthesia     "Mom gets so sick off of it"  . Heart murmur   . Chronic bronchitis     "get it ~ q yr" (01/01/2014)  . GERD (gastroesophageal reflux disease)   . Headache     "monthly" (01/01/2014)  . Migraine     "monthly" (01/01/2014)  . Arthritis     "qwhere"   Past Surgical History  Procedure Laterality Date  .  Cholecystectomy  2009  . Tubal ligation  198  . Cesarean section  1983; 1986; 1988   Family History  Problem Relation Age of Onset  . Heart attack Mother     ? in her 69's - never sought treatment  . Pulmonary embolism Mother   . Other Father     ? hx  . Lupus Sister   . Heart failure Sister    History  Substance Use Topics  . Smoking status: Former Smoker -- 0.33 packs/day for 30 years    Types: Cigarettes    Quit date: 12/31/2013  . Smokeless tobacco: Not on file     Comment: pt smoke electronic cigarette  . Alcohol Use: 1.8 oz/week    0 Standard drinks or equivalent, 3 Glasses of wine per week   OB History    No data available     Review of Systems  Constitutional: Negative for fever.  Respiratory: Negative for cough and shortness of breath.   Gastrointestinal: Negative for vomiting and abdominal pain.  Musculoskeletal: Positive for neck pain. Negative for back pain.  All other systems reviewed and are negative.     Allergies  Benadryl; Codeine; Dilaudid; and Percocet  Home Medications   Prior to Admission medications   Medication Sig Start Date End Date Taking? Authorizing Provider  apixaban (ELIQUIS) 5 MG TABS tablet Take 1 tablet (5 mg total) by mouth 2 (two) times daily. 03/12/14   Lelon Perla, MD  ciprofloxacin (CIPRO) 500 MG tablet Take 1 tablet (500 mg total) by mouth 2 (two) times daily. Patient not taking: Reported on 04/18/2014 03/29/14   Tatyana Kirichenko, PA-C  diazepam (VALIUM) 5 MG tablet Take 1 tablet (5 mg total) by mouth 2 (two) times daily. 04/18/14   Larene Pickett, PA-C  diltiazem (CARDIZEM CD) 180 MG 24 hr capsule Take 1 capsule (180 mg total) by mouth daily. 01/02/14   Brett Canales, PA-C  hydrochlorothiazide (HYDRODIURIL) 25 MG tablet Take 25 mg by mouth daily.    Historical Provider, MD  ibuprofen (ADVIL,MOTRIN) 200 MG tablet Take 800 mg by mouth every 6 (six) hours as needed for headache or moderate pain.    Historical Provider, MD   ketorolac (TORADOL) 10 MG tablet Take 1 tablet (10 mg total) by mouth every 6 (six) hours as needed. 04/18/14   Larene Pickett, PA-C  metoprolol (LOPRESSOR) 50 MG tablet Take 1 tablet (50 mg total) by mouth 2 (two) times daily. 03/02/14   Lelon Perla, MD  metroNIDAZOLE (FLAGYL) 500 MG tablet Take 1 tablet (500 mg total) by mouth 3 (three) times daily. 03/29/14   Tatyana Kirichenko, PA-C   BP 150/93 mmHg  Pulse 88  Temp(Src) 98.3 F (36.8 C) (Oral)  Resp 20  SpO2 96% Physical Exam  Constitutional: She is oriented to person, place, and time. She appears well-developed and well-nourished. No distress.  HENT:  Head: Normocephalic and atraumatic.  Mouth/Throat: Oropharynx is clear and moist.  Eyes: EOM are normal. Pupils are equal, round, and reactive to light.  Neck: Normal range of motion. Neck supple.  Cardiovascular: Normal rate and regular rhythm.  Exam reveals no friction rub.   No murmur heard. Pulmonary/Chest: Effort normal and breath sounds normal. No respiratory distress. She has no wheezes. She has no rales.  Abdominal: Soft. She exhibits no distension. There is no tenderness. There is no rebound.  Musculoskeletal: She exhibits no edema.       Right shoulder: She exhibits decreased range of motion and tenderness (diffuse). She exhibits no swelling, no effusion, no spasm and normal pulse.       Cervical back: She exhibits tenderness (R entire trapezius). She exhibits no bony tenderness and no swelling.  Neurological: She is alert and oriented to person, place, and time.  Skin: She is not diaphoretic.  Nursing note and vitals reviewed.   ED Course  Procedures (including critical care time) Labs Review Labs Reviewed - No data to display  Imaging Review Dg Shoulder Right  05/29/2014   CLINICAL DATA:  RIGHT shoulder pain that began yesterday, sharp and throbbing pain, significant reduction in range of motion, no known injury, history arthritis, sickle trait  EXAM: RIGHT  SHOULDER - 2+ VIEW  COMPARISON:  None  FINDINGS: AC joint alignment normal.  Osseous mineralization normal.  Visualized RIGHT ribs intact.  Calcified loose body inferior to the RIGHT glenohumeral joint 19 mm greatest diameter.  No acute fracture, dislocation, or bone destruction.  IMPRESSION: Calcified loose body inferior to the RIGHT glenohumeral joint.  No acute bony abnormalities.   Electronically Signed   By: Lavonia Anayeli M.D.   On: 05/29/2014 16:55     EKG Interpretation   Date/Time:  Friday May 29 2014 15:57:38 EDT Ventricular Rate:  77 PR Interval:  151 QRS Duration: 68 QT Interval:  388 QTC Calculation: 439 R Axis:   66 Text Interpretation:  Sinus rhythm Similar to prior No significant change  since last tracing Confirmed by Mingo Amber  MD, [place] ([id number]) on 05/29/2014  4:05:54 PM      MDM   Final diagnoses:  Right shoulder pain  Musculoskeletal pain    6F here with R lateral chest pain. Began as neck pain, then progressed to shoulder pain. Similar to an episode about 6 weeks ago, however at that time no shoulder pain, only neck pain. No fevers, cough, SOB. Has some R sided chest pain from her shoulder pain. Diffuse R shoulder and R trapezius tenderness to palpation. Normal pulses, normal cap refill.  Likely musculoskeletal, will get screening EKG. Xray negative for fracture. Feeling better with toradol and valium. She is moving her arm around some on my exam. Stable for discharge, given Ortho f/u.    Evelina Bucy, MD 05/29/14 [phone]

## 2014-05-29 NOTE — Discharge Instructions (Signed)

## 2014-06-01 ENCOUNTER — Emergency Department (HOSPITAL_COMMUNITY): Payer: Self-pay

## 2014-06-01 ENCOUNTER — Encounter (HOSPITAL_COMMUNITY): Payer: Self-pay | Admitting: Emergency Medicine

## 2014-06-01 ENCOUNTER — Emergency Department (HOSPITAL_COMMUNITY)
Admission: EM | Admit: 2014-06-01 | Discharge: 2014-06-01 | Disposition: A | Discharge status: Home / Self Care | Payer: Self-pay | Attending: Emergency Medicine | Admitting: Emergency Medicine

## 2014-06-01 DIAGNOSIS — F329 Major depressive disorder, single episode, unspecified: Secondary | ICD-10-CM | POA: Insufficient documentation

## 2014-06-01 DIAGNOSIS — F419 Anxiety disorder, unspecified: Secondary | ICD-10-CM | POA: Insufficient documentation

## 2014-06-01 DIAGNOSIS — G8929 Other chronic pain: Secondary | ICD-10-CM | POA: Insufficient documentation

## 2014-06-01 DIAGNOSIS — M1990 Unspecified osteoarthritis, unspecified site: Secondary | ICD-10-CM | POA: Insufficient documentation

## 2014-06-01 DIAGNOSIS — R011 Cardiac murmur, unspecified: Secondary | ICD-10-CM | POA: Insufficient documentation

## 2014-06-01 DIAGNOSIS — Z8719 Personal history of other diseases of the digestive system: Secondary | ICD-10-CM | POA: Insufficient documentation

## 2014-06-01 DIAGNOSIS — I10 Essential (primary) hypertension: Secondary | ICD-10-CM | POA: Insufficient documentation

## 2014-06-01 DIAGNOSIS — M546 Pain in thoracic spine: Secondary | ICD-10-CM | POA: Insufficient documentation

## 2014-06-01 DIAGNOSIS — Z872 Personal history of diseases of the skin and subcutaneous tissue: Secondary | ICD-10-CM | POA: Insufficient documentation

## 2014-06-01 DIAGNOSIS — M62830 Muscle spasm of back: Secondary | ICD-10-CM | POA: Insufficient documentation

## 2014-06-01 DIAGNOSIS — Z7901 Long term (current) use of anticoagulants: Secondary | ICD-10-CM | POA: Insufficient documentation

## 2014-06-01 DIAGNOSIS — G43909 Migraine, unspecified, not intractable, without status migrainosus: Secondary | ICD-10-CM | POA: Insufficient documentation

## 2014-06-01 DIAGNOSIS — G4700 Insomnia, unspecified: Secondary | ICD-10-CM | POA: Insufficient documentation

## 2014-06-01 DIAGNOSIS — Z862 Personal history of diseases of the blood and blood-forming organs and certain disorders involving the immune mechanism: Secondary | ICD-10-CM | POA: Insufficient documentation

## 2014-06-01 DIAGNOSIS — Z87891 Personal history of nicotine dependence: Secondary | ICD-10-CM | POA: Insufficient documentation

## 2014-06-01 DIAGNOSIS — Z79899 Other long term (current) drug therapy: Secondary | ICD-10-CM | POA: Insufficient documentation

## 2014-06-01 DIAGNOSIS — R0789 Other chest pain: Secondary | ICD-10-CM | POA: Insufficient documentation

## 2014-06-01 MED ORDER — KETOROLAC TROMETHAMINE 60 MG/2ML IM SOLN
60.0000 mg | Freq: Once | INTRAMUSCULAR | Status: AC
  Administered 2014-06-01: 60 mg via INTRAMUSCULAR
  Filled 2014-06-01: qty 2

## 2014-06-01 MED ORDER — TRAMADOL HCL 50 MG PO TABS: 50.0000 mg | ORAL_TABLET | Freq: Four times a day (QID) | ORAL | Status: DC | PRN

## 2014-06-01 NOTE — ED Notes (Signed)
Declined W/C at D/C and was escorted to lobby by RN. 

## 2014-06-01 NOTE — Discharge Instructions (Signed)
Costochondritis Costochondritis is a condition in which the tissue (cartilage) that connects your ribs with your breastbone (sternum) becomes irritated. It causes pain in the chest and rib area. It usually goes away on its own over time. HOME CARE  Avoid activities that wear you out.  Do not strain your ribs. Avoid activities that use your:  Chest.  Belly.  Side muscles.  Put ice on the area for the first 2 days after the pain starts.  Put ice in a plastic bag.  Place a towel between your skin and the bag.  Leave the ice on for 20 minutes, 2-3 times a day.  Only take medicine as told by your doctor. GET HELP IF:  You have redness or puffiness (swelling) in the rib area.  Your pain does not go away with rest or medicine. GET HELP RIGHT AWAY IF:   Your pain gets worse.  You are very uncomfortable.  You have trouble breathing.  You cough up blood.  You start sweating or throwing up (vomiting).  You have a fever or lasting symptoms for more than 2-3 days.  You have a fever and your symptoms suddenly get worse. MAKE SURE YOU:   Understand these instructions.  Will watch your condition.  Will get help right away if you are not doing well or get worse. Document Released: 07/19/2007 Document Revised: 10/02/2012 Document Reviewed: 09/03/2012 [facility] Patient Information 2015 [place], [place]. This information is not intended to replace advice given to you by your health care provider. Make sure you discuss any questions you have with your health care provider.

## 2014-06-01 NOTE — ED Notes (Signed)
Pt c/o pain in neck onset 2 weeks ago, pain radiates to right shoulder and arm, and now her back. Pt seen at [facility] long for same and given valium and motrin.  Pt cannot take motrin because she has afib and was told to not take motrin. Pain increases with raising her arm.

## 2014-06-01 NOTE — ED Provider Notes (Signed)
CSN: [id number]     Arrival date & time 06/01/14  1011 History  This chart was scribed for non-physician practitioner, Delos Haring, PA-C, working with Lacretia Leigh, MD, by Stephania Fragmin, ED Scribe. This patient was seen in room TR11C/TR11C and the patient's care was started at 11:54 AM.    Chief Complaint  Patient presents with  . Back Pain   The history is provided by the patient. No language interpreter was used.    HPI Comments: Crystal Brennan is a 49 y.o. female with a PMHx of chronic upper back pain, hypertension, depression, anxiety, and morbid obesity who presents to the Emergency Department complaining of burning, aching upper back pain that began 2 weeks ago but her symptoms are worse the past two days. She has had two or three previous providers advise her that her symptoms are muscular. She feels that no one believes her.  She endorses associated diaphoresis this morning and reports associated numbness in her bilateral hands. She denies any falls or injuries. Deep inspiration and palpation exacerbate the pain; she states her breathing is otherwise normal.  She was given a Toradol injection which significantly alleviated her pain.  Patient denies cough or SOB.   Past Medical History  Diagnosis Date  . Chronic back pain     "upper" (01/01/2014)  . Hypertension   . Depression   . Anxiety   . Anemia   . Menometrorrhagia   . Insomnia   . Diverticulitis   . Sickle cell trait   . Cardiomegaly     a. 2011 Echo: EF 65-70%, Gr 1 DD, nl wall motion.  . Morbid obesity   . Tobacco abuse   . Palpitations     a. Date back to 08/2013.  . Diverticulitis     a. 09/2013  . Family history of adverse reaction to anesthesia     "Mom gets so sick off of it"  . Heart murmur   . Chronic bronchitis     "get it ~ q yr" (01/01/2014)  . GERD (gastroesophageal reflux disease)   . Headache     "monthly" (01/01/2014)  . Migraine     "monthly" (01/01/2014)  . Arthritis     "qwhere"   Past  Surgical History  Procedure Laterality Date  . Cholecystectomy  2009  . Tubal ligation  198  . Cesarean section  1983; 1986; 1988   Family History  Problem Relation Age of Onset  . Heart attack Mother     ? in her 72's - never sought treatment  . Pulmonary embolism Mother   . Other Father     ? hx  . Lupus Sister   . Heart failure Sister    History  Substance Use Topics  . Smoking status: Former Smoker -- 0.33 packs/day for 30 years    Types: Cigarettes    Quit date: 12/31/2013  . Smokeless tobacco: Not on file     Comment: pt smoke electronic cigarette  . Alcohol Use: 1.8 oz/week    0 Standard drinks or equivalent, 3 Glasses of wine per week   OB History    No data available     Review of Systems  A complete 10 system review of systems was obtained and all systems are negative except as noted in the HPI and PMH.   Allergies  Benadryl; Codeine; Dilaudid; and Percocet  Home Medications   Prior to Admission medications   Medication Sig Start Date End Date Taking? Authorizing Provider  apixaban (ELIQUIS) 5 MG TABS tablet Take 1 tablet (5 mg total) by mouth 2 (two) times daily. 03/12/14   Lelon Perla, MD  ciprofloxacin (CIPRO) 500 MG tablet Take 1 tablet (500 mg total) by mouth 2 (two) times daily. Patient not taking: Reported on 04/18/2014 03/29/14   Tatyana Kirichenko, PA-C  diazepam (VALIUM) 5 MG tablet Take 1 tablet (5 mg total) by mouth 2 (two) times daily. 04/18/14   Larene Pickett, PA-C  diazepam (VALIUM) 5 MG tablet Take 1 tablet (5 mg total) by mouth every 8 (eight) hours as needed for anxiety. 05/29/14   Evelina Bucy, MD  diltiazem (CARDIZEM CD) 180 MG 24 hr capsule Take 1 capsule (180 mg total) by mouth daily. 01/02/14   Brett Canales, PA-C  hydrochlorothiazide (HYDRODIURIL) 25 MG tablet Take 25 mg by mouth daily.    Historical Provider, MD  ibuprofen (ADVIL,MOTRIN) 200 MG tablet Take 800 mg by mouth every 6 (six) hours as needed for headache or moderate pain.     Historical Provider, MD  ibuprofen (ADVIL,MOTRIN) 800 MG tablet Take 1 tablet (800 mg total) by mouth 3 (three) times daily. 05/29/14   Evelina Bucy, MD  ketorolac (TORADOL) 10 MG tablet Take 1 tablet (10 mg total) by mouth every 6 (six) hours as needed. 04/18/14   Larene Pickett, PA-C  metoprolol (LOPRESSOR) 50 MG tablet Take 1 tablet (50 mg total) by mouth 2 (two) times daily. 03/02/14   Lelon Perla, MD  metroNIDAZOLE (FLAGYL) 500 MG tablet Take 1 tablet (500 mg total) by mouth 3 (three) times daily. Patient not taking: Reported on 05/29/2014 03/29/14   Tatyana Kirichenko, PA-C  traMADol (ULTRAM) 50 MG tablet Take 1 tablet (50 mg total) by mouth every 6 (six) hours as needed. 06/01/14   Sherlonda Flater Carlota Raspberry, PA-C   BP 109/62 mmHg  Pulse 64  Temp(Src) 98.5 F (36.9 C) (Oral)  Resp 16  Ht 5\' 4"  (1.626 m)  Wt 239 lb (108.41 kg)  BMI 41.00 kg/m2  SpO2 100%  LMP 05/08/2014 (Within Days) Physical Exam  Constitutional: She is oriented to person, place, and time. She appears well-developed and well-nourished. No distress.  HENT:  Head: Normocephalic and atraumatic.  Eyes: Conjunctivae and EOM are normal.  Neck: Full passive range of motion without pain. Neck supple. No spinous process tenderness and no muscular tenderness present. No tracheal deviation and normal range of motion present.  Cardiovascular: Normal rate.   Pulmonary/Chest: Effort normal and breath sounds normal. No respiratory distress. She has no decreased breath sounds. She has no wheezes. She has no rhonchi. She has no rales. She exhibits no tenderness, no bony tenderness, no laceration and no crepitus.  Abdominal: Bowel sounds are normal. There is no tenderness. There is no rigidity and no guarding.  Musculoskeletal: Normal range of motion. She exhibits tenderness.       Thoracic back: She exhibits tenderness, pain and spasm. She exhibits normal range of motion, no bony tenderness, no swelling, no edema, no deformity and no laceration.        Back:  Pt has equal strength to bilateral lower upper extremities Neurosensory function adequate to both arms Skin color is normal. Skin is warm and moist.  I see no step off deformity, no midline bony tenderness.   No crepitus, laceration, effusion, induration, lesions, swelling.   Pedal pulses are symmetrical and palpable bilaterally  Tenderness to palpation of paraspinel muscles underneath the shoulder pain. Pain worsened to palpation and with ROM  of her right arm.   Neurological: She is alert and oriented to person, place, and time.  Skin: Skin is warm and dry.  Psychiatric: She has a normal mood and affect. Her behavior is normal.  Nursing note and vitals reviewed.   ED Course  Procedures (including critical care time)  DIAGNOSTIC STUDIES: Oxygen Saturation is 100% on room air, normal by my interpretation.    COORDINATION OF CARE: 11:58 AM - Discussed treatment plan with pt at bedside which includes Toradol injection and XRs, and pt agreed to plan.   Imaging Review Dg Chest 2 View  06/01/2014   CLINICAL DATA:  Chest pain.  Right shoulder pain.  EXAM: CHEST - 2 VIEW  COMPARISON:  None.  FINDINGS: The heart size and mediastinal contours are within normal limits. Both lungs are clear. The visualized skeletal structures are unremarkable.  IMPRESSION: Negative two view chest.   Electronically Signed   By: San Morelle M.D.   On: 06/01/2014 13:51   Dg Lumbar Spine Complete  06/01/2014   CLINICAL DATA:  Low back pain and right hip pain.  EXAM: LUMBAR SPINE - COMPLETE 4+ VIEW  COMPARISON:  10/09/2003  FINDINGS: There is no evidence of lumbar spine fracture. Alignment is normal. Intervertebral disc spaces are maintained.  IMPRESSION: Normal exam.   Electronically Signed   By: Lorriane Shire M.D.   On: 06/01/2014 13:47     EKG Interpretation None      MDM   Final diagnoses:  Chest wall pain  Right-sided thoracic back pain   Medications  ketorolac (TORADOL)  injection 60 mg (60 mg Intramuscular Given 06/01/14 1233)    Filed Vitals:   06/01/14 1414  BP: 109/62  Pulse: 64  Temp: 98.5 F (36.9 C)  Resp: 16    ED Discharge Orders     Start     Ordered   06/01/14 0000  traMADol (ULTRAM) 50 MG tablet Every 6 hours PRN    06/01/14 1407   Patient has a normal chest xray and lumbar x-rays. She does not meet any criteria for PE. Symptoms consistent with MSK. She reports not receiving any medications from other providers and says that the Motrin is not working. She wants a referral- will give  and [facility] and to [place].  49 y.o.Johney [place] Becton's evaluation in the Emergency Department is complete. It has been determined that no acute conditions requiring further emergency intervention are present at this time. The patient/guardian have been advised of the diagnosis and plan. We have discussed signs and symptoms that warrant return to the ED, such as changes or worsening in symptoms.  Vital signs are stable at discharge. Filed Vitals:   06/01/14 1414  BP: 109/62  Pulse: 64  Temp: 98.5 F (36.9 C)  Resp: 16    Patient/guardian has voiced understanding and agreed to follow-up with the PCP or specialist.  I personally performed the services described in this documentation, which was scribed in my presence. The recorded information has been reviewed and is accurate.     Delos Haring, PA-C 06/02/14 2121  Lacretia Leigh, MD 06/03/14 1002

## 2014-06-01 NOTE — ED Notes (Signed)
Pt bp retaken with regular cuff on Left forearm.

## 2014-06-01 NOTE — ED Notes (Addendum)
Pt here for c/o 'sharp' upper back pain x 2 days. Was seen at [facility] for neck pain x 2 within last 3 weeks for neck and right arm pain. Pt states she has tingling in 4th and 5th fingers of both hands, right > left. Denies N/V or SOB. States "it just hurts to take a deep breath". Pt is tearful, "I don't think anyone believes me... I can't be out of work".

## 2014-06-10 ENCOUNTER — Emergency Department (HOSPITAL_COMMUNITY)
Admission: EM | Admit: 2014-06-10 | Discharge: 2014-06-10 | Disposition: A | Discharge status: Home / Self Care | Payer: Self-pay | Attending: Emergency Medicine | Admitting: Emergency Medicine

## 2014-06-10 ENCOUNTER — Encounter (HOSPITAL_COMMUNITY): Payer: Self-pay | Admitting: *Deleted

## 2014-06-10 ENCOUNTER — Emergency Department (HOSPITAL_COMMUNITY): Payer: Self-pay

## 2014-06-10 DIAGNOSIS — R011 Cardiac murmur, unspecified: Secondary | ICD-10-CM | POA: Insufficient documentation

## 2014-06-10 DIAGNOSIS — Z862 Personal history of diseases of the blood and blood-forming organs and certain disorders involving the immune mechanism: Secondary | ICD-10-CM | POA: Insufficient documentation

## 2014-06-10 DIAGNOSIS — F419 Anxiety disorder, unspecified: Secondary | ICD-10-CM | POA: Insufficient documentation

## 2014-06-10 DIAGNOSIS — Z9049 Acquired absence of other specified parts of digestive tract: Secondary | ICD-10-CM | POA: Insufficient documentation

## 2014-06-10 DIAGNOSIS — I10 Essential (primary) hypertension: Secondary | ICD-10-CM | POA: Insufficient documentation

## 2014-06-10 DIAGNOSIS — K5792 Diverticulitis of intestine, part unspecified, without perforation or abscess without bleeding: Secondary | ICD-10-CM | POA: Insufficient documentation

## 2014-06-10 DIAGNOSIS — Z87891 Personal history of nicotine dependence: Secondary | ICD-10-CM | POA: Insufficient documentation

## 2014-06-10 DIAGNOSIS — Z7902 Long term (current) use of antithrombotics/antiplatelets: Secondary | ICD-10-CM | POA: Insufficient documentation

## 2014-06-10 DIAGNOSIS — Z9851 Tubal ligation status: Secondary | ICD-10-CM | POA: Insufficient documentation

## 2014-06-10 DIAGNOSIS — Z8709 Personal history of other diseases of the respiratory system: Secondary | ICD-10-CM | POA: Insufficient documentation

## 2014-06-10 DIAGNOSIS — F329 Major depressive disorder, single episode, unspecified: Secondary | ICD-10-CM | POA: Insufficient documentation

## 2014-06-10 DIAGNOSIS — R109 Unspecified abdominal pain: Secondary | ICD-10-CM

## 2014-06-10 DIAGNOSIS — Z8739 Personal history of other diseases of the musculoskeletal system and connective tissue: Secondary | ICD-10-CM | POA: Insufficient documentation

## 2014-06-10 DIAGNOSIS — Z79899 Other long term (current) drug therapy: Secondary | ICD-10-CM | POA: Insufficient documentation

## 2014-06-10 DIAGNOSIS — G8929 Other chronic pain: Secondary | ICD-10-CM | POA: Insufficient documentation

## 2014-06-10 DIAGNOSIS — Z792 Long term (current) use of antibiotics: Secondary | ICD-10-CM | POA: Insufficient documentation

## 2014-06-10 DIAGNOSIS — Z9889 Other specified postprocedural states: Secondary | ICD-10-CM | POA: Insufficient documentation

## 2014-06-10 LAB — COMPREHENSIVE METABOLIC PANEL
ALT: 16 U/L (ref 0–35)
AST: 21 U/L (ref 0–37)
Albumin: 4 g/dL (ref 3.5–5.2)
Alkaline Phosphatase: 66 U/L (ref 39–117)
Anion gap: 9 (ref 5–15)
BUN: 9 mg/dL (ref 6–23)
CALCIUM: 9 mg/dL (ref 8.4–10.5)
CO2: 26 mmol/L (ref 19–32)
Chloride: 103 mmol/L (ref 96–112)
Creatinine, Ser: 0.81 mg/dL (ref 0.50–1.10)
GFR, EST NON AFRICAN AMERICAN: 83 mL/min — AB (ref >90)
GLUCOSE: 132 mg/dL — AB (ref 70–99)
Potassium: 4 mmol/L (ref 3.5–5.1)
Sodium: 138 mmol/L (ref 135–145)
Total Bilirubin: 0.6 mg/dL (ref 0.3–1.2)
Total Protein: 7.7 g/dL (ref 6.0–8.3)

## 2014-06-10 LAB — URINE MICROSCOPIC-ADD ON

## 2014-06-10 LAB — CBC WITH DIFFERENTIAL/PLATELET
BASOS ABS: 0 10*3/uL (ref 0.0–0.1)
BASOS PCT: 0 % (ref 0–1)
Eosinophils Absolute: 0.2 10*3/uL (ref 0.0–0.7)
Eosinophils Relative: 2 % (ref 0–5)
HCT: 36.3 % (ref 36.0–46.0)
Hemoglobin: 12 g/dL (ref 12.0–15.0)
Lymphocytes Relative: 16 % (ref 12–46)
Lymphs Abs: 2.1 10*3/uL (ref 0.7–4.0)
MCH: 29.3 pg (ref 26.0–34.0)
MCHC: 33.1 g/dL (ref 30.0–36.0)
MCV: 88.8 fL (ref 78.0–100.0)
MONO ABS: 0.8 10*3/uL (ref 0.1–1.0)
Monocytes Relative: 7 % (ref 3–12)
NEUTROS ABS: 9.6 10*3/uL — AB (ref 1.7–7.7)
NEUTROS PCT: 75 % (ref 43–77)
Platelets: 422 10*3/uL — ABNORMAL HIGH (ref 150–400)
RBC: 4.09 MIL/uL (ref 3.87–5.11)
RDW: 14.7 % (ref 11.5–15.5)
WBC: 12.7 10*3/uL — ABNORMAL HIGH (ref 4.0–10.5)

## 2014-06-10 LAB — I-STAT BETA HCG BLOOD, ED (MC, WL, AP ONLY): I-stat hCG, quantitative: <5 m[IU]/mL (ref <5)

## 2014-06-10 LAB — URINALYSIS, ROUTINE W REFLEX MICROSCOPIC
BILIRUBIN URINE: NEGATIVE
GLUCOSE, UA: NEGATIVE mg/dL
Ketones, ur: NEGATIVE mg/dL
Nitrite: NEGATIVE
PH: 8.5 — AB (ref 5.0–8.0)
PROTEIN: 30 mg/dL — AB
SPECIFIC GRAVITY, URINE: 1.021 (ref 1.005–1.030)
UROBILINOGEN UA: 0.2 mg/dL (ref 0.0–1.0)

## 2014-06-10 LAB — LIPASE, BLOOD: LIPASE: 18 U/L (ref 11–59)

## 2014-06-10 MED ORDER — ONDANSETRON HCL 4 MG PO TABS: 4.0000 mg | ORAL_TABLET | Freq: Four times a day (QID) | ORAL | Status: DC

## 2014-06-10 MED ORDER — IOHEXOL 300 MG/ML  SOLN
50.0000 mL | Freq: Once | INTRAMUSCULAR | Status: AC | PRN
  Administered 2014-06-10: 50 mL via ORAL

## 2014-06-10 MED ORDER — IOHEXOL 300 MG/ML  SOLN
100.0000 mL | Freq: Once | INTRAMUSCULAR | Status: AC | PRN
  Administered 2014-06-10: 100 mL via INTRAVENOUS

## 2014-06-10 MED ORDER — CIPROFLOXACIN HCL 500 MG PO TABS: 500.0000 mg | ORAL_TABLET | Freq: Two times a day (BID) | ORAL | Status: DC

## 2014-06-10 MED ORDER — HYDROXYZINE HCL 25 MG PO TABS: 25.0000 mg | ORAL_TABLET | Freq: Four times a day (QID) | ORAL | Status: DC | PRN

## 2014-06-10 MED ORDER — FENTANYL CITRATE (PF) 100 MCG/2ML IJ SOLN
75.0000 ug | Freq: Once | INTRAMUSCULAR | Status: AC
  Administered 2014-06-10: 75 ug via INTRAVENOUS
  Filled 2014-06-10: qty 2

## 2014-06-10 MED ORDER — METRONIDAZOLE 500 MG PO TABS
500.0000 mg | ORAL_TABLET | Freq: Once | ORAL | Status: AC
  Administered 2014-06-10: 500 mg via ORAL
  Filled 2014-06-10: qty 1

## 2014-06-10 MED ORDER — HYDROCODONE-ACETAMINOPHEN 5-325 MG PO TABS: 1.0000 | ORAL_TABLET | ORAL | Status: DC | PRN

## 2014-06-10 MED ORDER — ONDANSETRON HCL 4 MG/2ML IJ SOLN
4.0000 mg | Freq: Once | INTRAMUSCULAR | Status: AC
  Administered 2014-06-10: 4 mg via INTRAVENOUS
  Filled 2014-06-10: qty 2

## 2014-06-10 MED ORDER — SODIUM CHLORIDE 0.9 % IV SOLN: 1000.0000 mL | Freq: Once | INTRAVENOUS | Status: DC

## 2014-06-10 MED ORDER — CIPROFLOXACIN IN D5W 400 MG/200ML IV SOLN
400.0000 mg | Freq: Two times a day (BID) | INTRAVENOUS | Status: DC
  Administered 2014-06-10: 400 mg via INTRAVENOUS
  Filled 2014-06-10: qty 200

## 2014-06-10 MED ORDER — SODIUM CHLORIDE 0.9 % IV SOLN: 1000.0000 mL | INTRAVENOUS | Status: DC

## 2014-06-10 MED ORDER — METRONIDAZOLE 500 MG PO TABS: 500.0000 mg | ORAL_TABLET | Freq: Three times a day (TID) | ORAL | Status: DC

## 2014-06-10 NOTE — ED Notes (Signed)
Pt admits generalized abd pain x2 days, no BM x2 days as well - denies n/v or fever. Pt grimacing on assessment.

## 2014-06-10 NOTE — Discharge Instructions (Signed)

## 2014-06-10 NOTE — ED Provider Notes (Signed)
CSN: [id number]     Arrival date & time 06/10/14  0216 History   First MD Initiated Contact with Patient 06/10/14 0701     Chief Complaint  Patient presents with  . Abdominal Pain    Patient is a 49 y.o. female presenting with abdominal pain. The history is provided by the patient.  Abdominal Pain Pain location:  LLQ and RLQ Pain quality: aching   Pain radiates to:  Does not radiate Pain severity:  Severe Onset quality:  Gradual Timing:  Constant Progression:  Worsening Associated symptoms: diarrhea and nausea   Associated symptoms: no cough, no dysuria, no fever and no vomiting   Started yesterday at work.  Gradually getting worse.  Feels like when she had diverticulitis.  10 loose stools.  No blood in stool.   She is having some menstrual bleeding now which is not unusual for her.  She has irregular menses.   Past Medical History  Diagnosis Date  . Chronic back pain     "upper" (01/01/2014)  . Hypertension   . Depression   . Anxiety   . Anemia   . Menometrorrhagia   . Insomnia   . Diverticulitis   . Sickle cell trait   . Cardiomegaly     a. 2011 Echo: EF 65-70%, Gr 1 DD, nl wall motion.  . Morbid obesity   . Tobacco abuse   . Palpitations     a. Date back to 08/2013.  . Diverticulitis     a. 09/2013  . Family history of adverse reaction to anesthesia     "Mom gets so sick off of it"  . Heart murmur   . Chronic bronchitis     "get it ~ q yr" (01/01/2014)  . GERD (gastroesophageal reflux disease)   . Headache     "monthly" (01/01/2014)  . Migraine     "monthly" (01/01/2014)  . Arthritis     "qwhere"   Past Surgical History  Procedure Laterality Date  . Cholecystectomy  2009  . Tubal ligation  198  . Cesarean section  1983; 1986; 1988   Family History  Problem Relation Age of Onset  . Heart attack Mother     ? in her 48's - never sought treatment  . Pulmonary embolism Mother   . Other Father     ? hx  . Lupus Sister   . Heart failure Sister    History   Substance Use Topics  . Smoking status: Former Smoker -- 0.33 packs/day for 30 years    Types: Cigarettes    Quit date: 12/31/2013  . Smokeless tobacco: Not on file     Comment: pt smoke electronic cigarette  . Alcohol Use: 1.8 oz/week    0 Standard drinks or equivalent, 3 Glasses of wine per week   OB History    No data available     Review of Systems  Constitutional: Negative for fever.  Respiratory: Negative for cough.   Gastrointestinal: Positive for nausea, abdominal pain and diarrhea. Negative for vomiting.  Genitourinary: Negative for dysuria.  All other systems reviewed and are negative.     Allergies  Benadryl; Codeine; Dilaudid; and Percocet  Home Medications   Prior to Admission medications   Medication Sig Start Date End Date Taking? Authorizing Provider  apixaban (ELIQUIS) 5 MG TABS tablet Take 1 tablet (5 mg total) by mouth 2 (two) times daily. 03/12/14  Yes Lelon Perla, MD  diazepam (VALIUM) 5 MG tablet Take 1 tablet (5  mg total) by mouth every 8 (eight) hours as needed for anxiety. 05/29/14  Yes Evelina Bucy, MD  diltiazem (CARDIZEM CD) 180 MG 24 hr capsule Take 1 capsule (180 mg total) by mouth daily. 01/02/14  Yes Brett Canales, PA-C  hydrochlorothiazide (HYDRODIURIL) 25 MG tablet Take 25 mg by mouth daily.   Yes Historical Provider, MD  metoprolol (LOPRESSOR) 50 MG tablet Take 1 tablet (50 mg total) by mouth 2 (two) times daily. 03/02/14  Yes Lelon Perla, MD  traMADol (ULTRAM) 50 MG tablet Take 1 tablet (50 mg total) by mouth every 6 (six) hours as needed. 06/01/14  Yes Tiffany Carlota Raspberry, PA-C  ciprofloxacin (CIPRO) 500 MG tablet Take 1 tablet (500 mg total) by mouth 2 (two) times daily. 06/10/14   Dorie Rank, MD  diazepam (VALIUM) 5 MG tablet Take 1 tablet (5 mg total) by mouth 2 (two) times daily. Patient not taking: Reported on 06/10/2014 04/18/14   Larene Pickett, PA-C  HYDROcodone-acetaminophen (NORCO/VICODIN) 5-325 MG per tablet Take 1-2 tablets by  mouth every 4 (four) hours as needed. 06/10/14   Dorie Rank, MD  hydrOXYzine (ATARAX/VISTARIL) 25 MG tablet Take 1 tablet (25 mg total) by mouth every 6 (six) hours as needed for itching. 06/10/14   Dorie Rank, MD  ibuprofen (ADVIL,MOTRIN) 800 MG tablet Take 1 tablet (800 mg total) by mouth 3 (three) times daily. Patient not taking: Reported on 06/10/2014 05/29/14   Evelina Bucy, MD  ketorolac (TORADOL) 10 MG tablet Take 1 tablet (10 mg total) by mouth every 6 (six) hours as needed. Patient not taking: Reported on 06/10/2014 04/18/14   Larene Pickett, PA-C  metroNIDAZOLE (FLAGYL) 500 MG tablet Take 1 tablet (500 mg total) by mouth 3 (three) times daily. 06/10/14   Dorie Rank, MD  ondansetron (ZOFRAN) 4 MG tablet Take 1 tablet (4 mg total) by mouth every 6 (six) hours. 06/10/14   Dorie Rank, MD   BP 121/51 mmHg  Pulse 86  Temp(Src) 98.2 F (36.8 C) (Oral)  Resp 18  SpO2 100%  LMP 06/02/2014 Physical Exam  Constitutional: She appears well-developed and well-nourished. No distress.  Overweight   HENT:  Head: Normocephalic and atraumatic.  Right Ear: External ear normal.  Left Ear: External ear normal.  Eyes: Conjunctivae are normal. Right eye exhibits no discharge. Left eye exhibits no discharge. No scleral icterus.  Neck: Neck supple. No tracheal deviation present.  Cardiovascular: Normal rate, regular rhythm and intact distal pulses.   Pulmonary/Chest: Effort normal and breath sounds normal. No stridor. No respiratory distress. She has no wheezes. She has no rales.  Abdominal: Soft. Bowel sounds are normal. She exhibits no distension. There is tenderness in the right lower quadrant and left lower quadrant. There is no rebound and no guarding. No hernia.  Musculoskeletal: She exhibits no edema or tenderness.  Neurological: She is alert. She has normal strength. No cranial nerve deficit (no facial droop, extraocular movements intact, no slurred speech) or sensory deficit. She exhibits normal muscle  tone. She displays no seizure activity. Coordination normal.  Skin: Skin is warm and dry. No rash noted.  Psychiatric: She has a normal mood and affect.  Nursing note and vitals reviewed.   ED Course  Procedures (including critical care time) Labs Review Labs Reviewed  CBC WITH DIFFERENTIAL/PLATELET - Abnormal; Notable for the following:    WBC 12.7 (*)    Platelets 422 (*)    Neutro Abs 9.6 (*)    All other components within normal  limits  COMPREHENSIVE METABOLIC PANEL - Abnormal; Notable for the following:    Glucose, Bld 132 (*)    GFR calc non Af Amer 83 (*)    All other components within normal limits  URINALYSIS, ROUTINE W REFLEX MICROSCOPIC - Abnormal; Notable for the following:    APPearance CLOUDY (*)    pH 8.5 (*)    Hgb urine dipstick LARGE (*)    Protein, ur 30 (*)    Leukocytes, UA MODERATE (*)    All other components within normal limits  LIPASE, BLOOD  URINE MICROSCOPIC-ADD ON  I-STAT BETA HCG BLOOD, ED ([facility], [facility], [facility] ONLY)    Imaging Review Ct Abdomen Pelvis W Contrast  06/10/2014   CLINICAL DATA:  Acute, diffuse abdominal pain for 2 days. No bowel movement for 2 days. Personal history of diverticulitis.  EXAM: CT ABDOMEN AND PELVIS WITH CONTRAST  TECHNIQUE: Multidetector CT imaging of the abdomen and pelvis was performed using the standard protocol following bolus administration of intravenous contrast.  CONTRAST:  138mL OMNIPAQUE IOHEXOL 300 MG/ML  SOLN  COMPARISON:  03/29/2014  FINDINGS: Minimal dependent atelectasis is present in the lung bases.  Multiple small low-density lesions are again seen in the liver and do not appear significantly changed. The largest measures and is consistent with a cyst, while others are too small to fully characterize. The gallbladder is surgically absent. No biliary dilatation is seen. The spleen, adrenal glands, kidneys, and pancreas have an unremarkable enhanced appearance.  Oral contrast is present nondilated loops of proximal and  mid small bowel. There is no evidence of obstruction. Colonic diverticulosis is again noted. The sigmoid colon is again seen to be tortuous and extend into the right lower quadrant, and there is pericolonic inflammatory change surrounding the mid sigmoid colon in the right lower quadrant, overall slightly less than that seen on the prior study. No intraperitoneal free air or fluid collection is seen. The appendix is not clearly identified, however no inflammation is seen about the cecum. Small right lower quadrant lymph nodes measure up to 9 mm in short axis, similar to the prior study.  No free fluid is seen. 10 mm enhancing focus in the left aspect of the uterus is unchanged, possibly a small fibroid. Ovaries are grossly unremarkable. Bladder is unremarkable. No acute osseous abnormality is identified.  IMPRESSION: Sigmoid diverticulitis without evidence of perforation.   Electronically Signed   By: Logan Bores   On: 06/10/2014 09:06    Medications  fentaNYL (SUBLIMAZE) injection 75 mcg (not administered)  ciprofloxacin (CIPRO) IVPB 400 mg (not administered)  metroNIDAZOLE (FLAGYL) tablet 500 mg (not administered)  ondansetron (ZOFRAN) injection 4 mg (4 mg Intravenous Given 06/10/14 0742)  fentaNYL (SUBLIMAZE) injection 75 mcg (75 mcg Intravenous Given 06/10/14 0743)  iohexol (OMNIPAQUE) 300 MG/ML solution 50 mL (50 mLs Oral Contrast Given 06/10/14 0720)  iohexol (OMNIPAQUE) 300 MG/ML solution 100 mL (100 mLs Intravenous Contrast Given 06/10/14 0840)     MDM   Final diagnoses:  Acute diverticulitis    The patient's CT scan shows sigmoid diverticulitis without evidence of perforation or abscess. Patient does have a history of prior diverticulitis. Patient was given medications for pain in the emergency department. She is feeling better than she did when she first arrived. She does still have some residual discomfort.  I offered the patient the option of inpatient treatment for her diverticulitis  versus outpatient treatment and close follow-up. Patient would prefer to try and go home should. She understands if  things get worse to come back to the emergency room. We discussed pain management. Patient has an allergy to oxycodone but thinks she can take hydrocodone. I will discharge her home with prescriptions for Cipro, Flagyl, hydrocodone, and hydroxyzine to take for itching as needed. I will also give her prescription for Zofran for nausea.    Dorie Rank, MD 06/10/14 [phone]

## 2014-06-27 NOTE — Progress Notes (Signed)
HPI: FU atrial fibrillation; admitted 11/15 with new onset atrial fibrillation; TSH normal. Lexiscan cardiolite was completed and negative for ischemia; EF 78. Echocardiogram revealed an EF of 65-70% with normal wall motion. Mild concentric hypertrophy. G1DD. Peak PA pressure of 23mmHg. Converted with cardizem. Since last seen, patient denies dyspnea, chest pain or syncope. Brief episode of palpitations 2 weeks ago. No syncope or bleeding.  Current Outpatient Prescriptions  Medication Sig Dispense Refill  . apixaban (ELIQUIS) 5 MG TABS tablet Take 1 tablet (5 mg total) by mouth 2 (two) times daily. 180 tablet 3  . diazepam (VALIUM) 5 MG tablet Take 1 tablet (5 mg total) by mouth 2 (two) times daily. 14 tablet 0  . diazepam (VALIUM) 5 MG tablet Take 1 tablet (5 mg total) by mouth every 8 (eight) hours as needed for anxiety. 20 tablet 0  . diltiazem (CARDIZEM CD) 180 MG 24 hr capsule Take 1 capsule (180 mg total) by mouth daily. 30 capsule 11  . hydrochlorothiazide (HYDRODIURIL) 25 MG tablet Take 25 mg by mouth daily.    Marland Kitchen HYDROcodone-acetaminophen (NORCO/VICODIN) 5-325 MG per tablet Take 1-2 tablets by mouth every 4 (four) hours as needed. 16 tablet 0  . hydrOXYzine (ATARAX/VISTARIL) 25 MG tablet Take 1 tablet (25 mg total) by mouth every 6 (six) hours as needed for itching. 12 tablet 0  . ketorolac (TORADOL) 10 MG tablet Take 1 tablet (10 mg total) by mouth every 6 (six) hours as needed. 20 tablet 0  . metoprolol (LOPRESSOR) 50 MG tablet Take 1 tablet (50 mg total) by mouth 2 (two) times daily. 180 tablet 3  . ondansetron (ZOFRAN) 4 MG tablet Take 1 tablet (4 mg total) by mouth every 6 (six) hours. 12 tablet 0  . traMADol (ULTRAM) 50 MG tablet Take 1 tablet (50 mg total) by mouth every 6 (six) hours as needed. 20 tablet 0   No current facility-administered medications for this visit.     Past Medical History  Diagnosis Date  . Chronic back pain     "upper" (01/01/2014)  .  Hypertension   . Depression   . Anxiety   . Anemia   . Menometrorrhagia   . Insomnia   . Diverticulitis   . Sickle cell trait   . Cardiomegaly     a. 2011 Echo: EF 65-70%, Gr 1 DD, nl wall motion.  . Morbid obesity   . Tobacco abuse   . Palpitations     a. Date back to 08/2013.  . Diverticulitis     a. 09/2013  . Family history of adverse reaction to anesthesia     "Mom gets so sick off of it"  . Heart murmur   . Chronic bronchitis     "get it ~ q yr" (01/01/2014)  . GERD (gastroesophageal reflux disease)   . Headache     "monthly" (01/01/2014)  . Migraine     "monthly" (01/01/2014)  . Arthritis     "qwhere"    Past Surgical History  Procedure Laterality Date  . Cholecystectomy  2009  . Tubal ligation  198  . Cesarean section  3491; 1986; 1988    History   Social History  . Marital Status: Single    Spouse Name: N/A  . Number of Children: N/A  . Years of Education: N/A   Occupational History  . Not on file.   Social History Main Topics  . Smoking status: Former Smoker -- 0.33 packs/day for 30 years  Types: Cigarettes    Quit date: 12/31/2013  . Smokeless tobacco: Not on file     Comment: pt smoke electronic cigarette  . Alcohol Use: 1.8 oz/week    0 Standard drinks or equivalent, 3 Glasses of wine per week  . Drug Use: No  . Sexual Activity: Not on file   Other Topics Concern  . Not on file   Social History Narrative   Lives in [place] with fiance.  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  Walks regularly.  Trying to lose weight.    ROS: no fevers or chills, productive cough, hemoptysis, dysphasia, odynophagia, melena, hematochezia, dysuria, hematuria, rash, seizure activity, orthopnea, PND, pedal edema, claudication. Remaining systems are negative.  Physical Exam: Well-developed obese in no acute distress.  Skin is warm and dry.  HEENT is normal.  Neck is supple.  Chest is clear to auscultation with normal expansion.  Cardiovascular  exam is regular rate and rhythm.  Abdominal exam nontender or distended. No masses palpated. Extremities show no edema. neuro grossly intact  ECG 06/01/2014-sinus rhythm.

## 2014-06-29 ENCOUNTER — Encounter: Payer: Self-pay | Admitting: Cardiology

## 2014-06-29 ENCOUNTER — Ambulatory Visit (INDEPENDENT_AMBULATORY_CARE_PROVIDER_SITE_OTHER): Payer: Self-pay | Admitting: Cardiology

## 2014-06-29 VITALS — BP 124/72 | HR 80 | Ht 64.0 in | Wt 244.6 lb

## 2014-06-29 DIAGNOSIS — R7309 Other abnormal glucose: Secondary | ICD-10-CM

## 2014-06-29 DIAGNOSIS — Z720 Tobacco use: Secondary | ICD-10-CM

## 2014-06-29 DIAGNOSIS — E669 Obesity, unspecified: Secondary | ICD-10-CM

## 2014-06-29 DIAGNOSIS — I10 Essential (primary) hypertension: Secondary | ICD-10-CM

## 2014-06-29 DIAGNOSIS — I480 Paroxysmal atrial fibrillation: Secondary | ICD-10-CM

## 2014-06-29 NOTE — Patient Instructions (Signed)
Your physician wants you to follow-up in: 6 MONTHS WITH DR CRENSHAW You will receive a reminder letter in the mail two months in advance. If you don't receive a letter, please call our office to schedule the follow-up appointment.  

## 2014-06-29 NOTE — Assessment & Plan Note (Signed)
Needs weight loss. 

## 2014-06-29 NOTE — Assessment & Plan Note (Signed)
We discussed the importance of discontinuing. She has decreased the amount she is smoking.

## 2014-06-29 NOTE — Assessment & Plan Note (Signed)
Patient's glucose was noted to be elevated on recent laboratories in the emergency room. I have asked her to establish with a primary care physician as she may well have diabetes mellitus.

## 2014-06-29 NOTE — Assessment & Plan Note (Signed)
Blood pressure controlled. Continue present medications. 

## 2014-06-29 NOTE — Assessment & Plan Note (Signed)
Patient in sinus rhythm on exam today. Embolic risk factors of hypertension and female sex. Continue apixaban. Continue Cardizem and metoprolol. If she has more frequent episodes of atrial fibrillation in the future we will consider an antiarrhythmic.

## 2014-07-03 ENCOUNTER — Encounter (HOSPITAL_BASED_OUTPATIENT_CLINIC_OR_DEPARTMENT_OTHER): Payer: Self-pay

## 2014-07-06 ENCOUNTER — Encounter (HOSPITAL_BASED_OUTPATIENT_CLINIC_OR_DEPARTMENT_OTHER): Payer: Self-pay

## 2014-09-30 ENCOUNTER — Emergency Department (HOSPITAL_COMMUNITY)
Admission: EM | Admit: 2014-09-30 | Discharge: 2014-09-30 | Disposition: A | Discharge status: Home / Self Care | Payer: Self-pay | Attending: Emergency Medicine | Admitting: Emergency Medicine

## 2014-09-30 ENCOUNTER — Encounter (HOSPITAL_COMMUNITY): Payer: Self-pay | Admitting: Emergency Medicine

## 2014-09-30 ENCOUNTER — Emergency Department (HOSPITAL_COMMUNITY): Payer: Self-pay

## 2014-09-30 DIAGNOSIS — Z8709 Personal history of other diseases of the respiratory system: Secondary | ICD-10-CM | POA: Insufficient documentation

## 2014-09-30 DIAGNOSIS — N83202 Unspecified ovarian cyst, left side: Secondary | ICD-10-CM

## 2014-09-30 DIAGNOSIS — Z9049 Acquired absence of other specified parts of digestive tract: Secondary | ICD-10-CM | POA: Insufficient documentation

## 2014-09-30 DIAGNOSIS — N8329 Other ovarian cysts: Secondary | ICD-10-CM | POA: Insufficient documentation

## 2014-09-30 DIAGNOSIS — I10 Essential (primary) hypertension: Secondary | ICD-10-CM | POA: Insufficient documentation

## 2014-09-30 DIAGNOSIS — Z87891 Personal history of nicotine dependence: Secondary | ICD-10-CM | POA: Insufficient documentation

## 2014-09-30 DIAGNOSIS — Z79899 Other long term (current) drug therapy: Secondary | ICD-10-CM | POA: Insufficient documentation

## 2014-09-30 DIAGNOSIS — Z9851 Tubal ligation status: Secondary | ICD-10-CM | POA: Insufficient documentation

## 2014-09-30 DIAGNOSIS — M1990 Unspecified osteoarthritis, unspecified site: Secondary | ICD-10-CM | POA: Insufficient documentation

## 2014-09-30 DIAGNOSIS — Z8742 Personal history of other diseases of the female genital tract: Secondary | ICD-10-CM | POA: Insufficient documentation

## 2014-09-30 DIAGNOSIS — F419 Anxiety disorder, unspecified: Secondary | ICD-10-CM | POA: Insufficient documentation

## 2014-09-30 DIAGNOSIS — Z862 Personal history of diseases of the blood and blood-forming organs and certain disorders involving the immune mechanism: Secondary | ICD-10-CM | POA: Insufficient documentation

## 2014-09-30 DIAGNOSIS — F329 Major depressive disorder, single episode, unspecified: Secondary | ICD-10-CM | POA: Insufficient documentation

## 2014-09-30 DIAGNOSIS — G43909 Migraine, unspecified, not intractable, without status migrainosus: Secondary | ICD-10-CM | POA: Insufficient documentation

## 2014-09-30 DIAGNOSIS — G4700 Insomnia, unspecified: Secondary | ICD-10-CM | POA: Insufficient documentation

## 2014-09-30 DIAGNOSIS — G8929 Other chronic pain: Secondary | ICD-10-CM | POA: Insufficient documentation

## 2014-09-30 DIAGNOSIS — K5732 Diverticulitis of large intestine without perforation or abscess without bleeding: Secondary | ICD-10-CM | POA: Insufficient documentation

## 2014-09-30 DIAGNOSIS — R011 Cardiac murmur, unspecified: Secondary | ICD-10-CM | POA: Insufficient documentation

## 2014-09-30 DIAGNOSIS — Z3202 Encounter for pregnancy test, result negative: Secondary | ICD-10-CM | POA: Insufficient documentation

## 2014-09-30 LAB — COMPREHENSIVE METABOLIC PANEL
ALBUMIN: 3.4 g/dL — AB (ref 3.5–5.0)
ALK PHOS: 50 U/L (ref 38–126)
ALT: 12 U/L — AB (ref 14–54)
AST: 23 U/L (ref 15–41)
Anion gap: 9 (ref 5–15)
BILIRUBIN TOTAL: 0.5 mg/dL (ref 0.3–1.2)
BUN: 12 mg/dL (ref 6–20)
CALCIUM: 9.1 mg/dL (ref 8.9–10.3)
CO2: 23 mmol/L (ref 22–32)
Chloride: 105 mmol/L (ref 101–111)
Creatinine, Ser: 0.92 mg/dL (ref 0.44–1.00)
GFR calc Af Amer: >60 mL/min (ref >60)
GFR calc non Af Amer: >60 mL/min (ref >60)
GLUCOSE: 144 mg/dL — AB (ref 65–99)
POTASSIUM: 3.9 mmol/L (ref 3.5–5.1)
Sodium: 137 mmol/L (ref 135–145)
TOTAL PROTEIN: 6.7 g/dL (ref 6.5–8.1)

## 2014-09-30 LAB — URINALYSIS, ROUTINE W REFLEX MICROSCOPIC
BILIRUBIN URINE: NEGATIVE
Glucose, UA: NEGATIVE mg/dL
HGB URINE DIPSTICK: NEGATIVE
Ketones, ur: NEGATIVE mg/dL
Leukocytes, UA: NEGATIVE
Nitrite: NEGATIVE
PH: 6 (ref 5.0–8.0)
Protein, ur: NEGATIVE mg/dL
SPECIFIC GRAVITY, URINE: 1.022 (ref 1.005–1.030)
UROBILINOGEN UA: 0.2 mg/dL (ref 0.0–1.0)

## 2014-09-30 LAB — CBC
HEMATOCRIT: 35 % — AB (ref 36.0–46.0)
Hemoglobin: 11.4 g/dL — ABNORMAL LOW (ref 12.0–15.0)
MCH: 28.6 pg (ref 26.0–34.0)
MCHC: 32.6 g/dL (ref 30.0–36.0)
MCV: 87.9 fL (ref 78.0–100.0)
Platelets: 372 10*3/uL (ref 150–400)
RBC: 3.98 MIL/uL (ref 3.87–5.11)
RDW: 15.2 % (ref 11.5–15.5)
WBC: 8.8 10*3/uL (ref 4.0–10.5)

## 2014-09-30 LAB — I-STAT BETA HCG BLOOD, ED (MC, WL, AP ONLY)

## 2014-09-30 LAB — LIPASE, BLOOD: LIPASE: 25 U/L (ref 22–51)

## 2014-09-30 MED ORDER — CIPROFLOXACIN HCL 500 MG PO TABS: 500.0000 mg | ORAL_TABLET | Freq: Two times a day (BID) | ORAL | Status: DC

## 2014-09-30 MED ORDER — MORPHINE SULFATE (PF) 4 MG/ML IV SOLN
4.0000 mg | Freq: Once | INTRAVENOUS | Status: AC
  Administered 2014-09-30: 4 mg via INTRAVENOUS
  Filled 2014-09-30: qty 1

## 2014-09-30 MED ORDER — IOHEXOL 300 MG/ML  SOLN
100.0000 mL | Freq: Once | INTRAMUSCULAR | Status: AC | PRN
  Administered 2014-09-30: 100 mL via INTRAVENOUS

## 2014-09-30 MED ORDER — OXYCODONE-ACETAMINOPHEN 5-325 MG PO TABS: 2.0000 | ORAL_TABLET | ORAL | Status: DC | PRN

## 2014-09-30 MED ORDER — SODIUM CHLORIDE 0.9 % IV SOLN
INTRAVENOUS | Status: DC
  Administered 2014-09-30: 13:00:00 via INTRAVENOUS

## 2014-09-30 MED ORDER — ONDANSETRON 4 MG PO TBDP: 4.0000 mg | ORAL_TABLET | ORAL | Status: DC | PRN

## 2014-09-30 MED ORDER — NAPROXEN 500 MG PO TABS: 500.0000 mg | ORAL_TABLET | Freq: Two times a day (BID) | ORAL | Status: DC

## 2014-09-30 MED ORDER — IOHEXOL 300 MG/ML  SOLN
50.0000 mL | Freq: Once | INTRAMUSCULAR | Status: AC | PRN
  Administered 2014-09-30: 50 mL via ORAL

## 2014-09-30 MED ORDER — METRONIDAZOLE 500 MG PO TABS: 500.0000 mg | ORAL_TABLET | Freq: Two times a day (BID) | ORAL | Status: DC

## 2014-09-30 NOTE — ED Notes (Signed)
Patient transported to CT 

## 2014-09-30 NOTE — Progress Notes (Signed)
CM spoke with pt who confirms uninsured [place] resident with no pcp.  CM discussed and provided written information for uninsured accepting pcps, discussed the importance of pcp vs EDP services for f/u care, www.needymeds.org, www.goodrx.com, discounted pharmacies and other [place] resources such as [facility] , [facility], affordable care act, financial assistance, uninsured dental services, [place] med assist, DSS and  health department  Reviewed resources for [place] uninsured accepting pcps like [facility], family medicine at [address], [facility], [facility], local urgent care centers, [facility], [facility], general [facility] clinics, [facility], [facility] plus others, medication resources, [facility] out patient pharmacies and housing Pt voiced understanding and appreciation of resources provided   Provided [facility] contact information Pt agreed to a referral Cm completed referral Pt to be contact by [facility] clinical liason  

## 2014-09-30 NOTE — ED Notes (Signed)
Pt request medication for "itching"

## 2014-09-30 NOTE — ED Notes (Signed)
Questions r/t dc were denied. Pt is a&ox4 and ambulatory

## 2014-09-30 NOTE — ED Notes (Signed)
Pt c/o pain and reports having Morphine in the past. Discussed use of medication d/t being on allergy list. Pt reports reaction as "itching" but says she has had it before and request med to be ordered to assist in pain management. Spoke with provider V.O. Given for Morphine 4 mg IV

## 2014-09-30 NOTE — Progress Notes (Signed)
CM spoke with pt who confirms uninsured [employer] resident with no pcp.  CM discussed and provided written information for uninsured accepting pcps, discussed the importance of pcp vs EDP services for f/u care, www.needymeds.org, www.goodrx.com, discounted pharmacies and other [employer] such as [employer] , [employer], affordable care act, financial assistance, uninsured dental services, [place] med assist, DSS and  health department  Reviewed resources for [employer] uninsured accepting pcps like [facility], [facility] at [employer], [facility], [facility], local urgent care centers, [facility], [facility], general medical clinics, [facility] [place], [facility] urgent care plus others, medication resources, [facility] out [facility]ies and housing Pt voiced understanding and appreciation of resources provided   Provided [facility] contact information Pt agreed to a referral Cm completed referral Pt to be contact by [facility] clinical liason  When Cm initially entered the pt's room she was "grumpy"  Pt states "Diverticulitis is no joke" Pt states she worked today in abdominal pain but was unable to continue working CM and pt discuss need to get a pcp for community ongoing care.  Pt states she previously had an orange card but it expired Pt states she was "turned down for medicaid too many times"

## 2014-09-30 NOTE — ED Provider Notes (Signed)
CSN: [id number]     Arrival date & time 09/30/14  1114 History   First MD Initiated Contact with Patient 09/30/14 1157     Chief Complaint  Patient presents with  . Abdominal Pain     (Consider location/radiation/quality/duration/timing/severity/associated sxs/prior Treatment) HPI Symptoms started approximately one week ago. They have been progressively worsening. Patient has severe and sharp pain in her left lower quadrant that is made worse by movement. She reports historically she gets intermittent episodes of diarrhea and constipation. More recently she has been having diarrheal bowel movement with no blood seen. She reports nausea but no episodes of vomiting. She reports this is similar to prior episodes of diverticulitis but the pain is worse. She denies pain burning or urgency with urination. Denies abnormal vaginal discharge or bleeding. No fever. Past Medical History  Diagnosis Date  . Chronic back pain     "upper" (01/01/2014)  . Hypertension   . Depression   . Anxiety   . Anemia   . Menometrorrhagia   . Insomnia   . Diverticulitis   . Sickle cell trait   . Cardiomegaly     a. 2011 Echo: EF 65-70%, Gr 1 DD, nl wall motion.  . Morbid obesity   . Tobacco abuse   . Palpitations     a. Date back to 08/2013.  . Diverticulitis     a. 09/2013  . Family history of adverse reaction to anesthesia     "Mom gets so sick off of it"  . Heart murmur   . Chronic bronchitis     "get it ~ q yr" (01/01/2014)  . GERD (gastroesophageal reflux disease)   . Headache     "monthly" (01/01/2014)  . Migraine     "monthly" (01/01/2014)  . Arthritis     "qwhere"   Past Surgical History  Procedure Laterality Date  . Cholecystectomy  2009  . Tubal ligation  198  . Cesarean section  1983; 1986; 1988   Family History  Problem Relation Age of Onset  . Heart attack Mother     ? in her 42's - never sought treatment  . Pulmonary embolism Mother   . Other Father     ? hx  . Lupus Sister    . Heart failure Sister    Social History  Substance Use Topics  . Smoking status: Former Smoker -- 0.33 packs/day for 30 years    Types: Cigarettes    Quit date: 12/31/2013  . Smokeless tobacco: None     Comment: pt smoke electronic cigarette  . Alcohol Use: 1.8 oz/week    0 Standard drinks or equivalent, 3 Glasses of wine per week   OB History    No data available     Review of Systems 10 Systems reviewed and are negative for acute change except as noted in the HPI.    Allergies  Benadryl; Codeine; Dilaudid; and Percocet  Home Medications   Prior to Admission medications   Medication Sig Start Date End Date Taking? Authorizing Provider  apixaban (ELIQUIS) 5 MG TABS tablet Take 1 tablet (5 mg total) by mouth 2 (two) times daily. 03/12/14  Yes Lelon Perla, MD  aspirin-acetaminophen-caffeine (EXCEDRIN MIGRAINE) [phone] MG per tablet Take 3 tablets by mouth every 6 (six) hours as needed for headache or migraine.   Yes Historical Provider, MD  diltiazem (CARDIZEM CD) 180 MG 24 hr capsule Take 1 capsule (180 mg total) by mouth daily. 01/02/14  Yes Brett Canales, PA-C  hydrochlorothiazide (HYDRODIURIL) 25 MG tablet Take 25 mg by mouth daily.   Yes Historical Provider, MD  HYDROcodone-acetaminophen (NORCO/VICODIN) 5-325 MG per tablet Take 1-2 tablets by mouth every 4 (four) hours as needed. Patient taking differently: Take 1-2 tablets by mouth every 4 (four) hours as needed for moderate pain or severe pain.  06/10/14  Yes Dorie Rank, MD  ketorolac (TORADOL) 10 MG tablet Take 1 tablet (10 mg total) by mouth every 6 (six) hours as needed. Patient taking differently: Take 10 mg by mouth every 6 (six) hours as needed for moderate pain.  04/18/14  Yes Larene Pickett, PA-C  metoprolol (LOPRESSOR) 50 MG tablet Take 1 tablet (50 mg total) by mouth 2 (two) times daily. 03/02/14  Yes Lelon Perla, MD  ciprofloxacin (CIPRO) 500 MG tablet Take 1 tablet (500 mg total) by mouth 2 (two) times  daily. One po bid x 7 days 09/30/14   Charlesetta Shanks, MD  diazepam (VALIUM) 5 MG tablet Take 1 tablet (5 mg total) by mouth 2 (two) times daily. Patient not taking: Reported on 09/30/2014 04/18/14   Larene Pickett, PA-C  diazepam (VALIUM) 5 MG tablet Take 1 tablet (5 mg total) by mouth every 8 (eight) hours as needed for anxiety. Patient not taking: Reported on 09/30/2014 05/29/14   Evelina Bucy, MD  hydrOXYzine (ATARAX/VISTARIL) 25 MG tablet Take 1 tablet (25 mg total) by mouth every 6 (six) hours as needed for itching. Patient not taking: Reported on 09/30/2014 06/10/14   Dorie Rank, MD  metroNIDAZOLE (FLAGYL) 500 MG tablet Take 1 tablet (500 mg total) by mouth 2 (two) times daily. One po bid x 7 days 09/30/14   Charlesetta Shanks, MD  naproxen (NAPROSYN) 500 MG tablet Take 1 tablet (500 mg total) by mouth 2 (two) times daily. 09/30/14   Charlesetta Shanks, MD  ondansetron (ZOFRAN ODT) 4 MG disintegrating tablet Take 1 tablet (4 mg total) by mouth every 4 (four) hours as needed for nausea or vomiting. 09/30/14   Charlesetta Shanks, MD  ondansetron (ZOFRAN) 4 MG tablet Take 1 tablet (4 mg total) by mouth every 6 (six) hours. Patient not taking: Reported on 09/30/2014 06/10/14   Dorie Rank, MD  oxyCODONE-acetaminophen (PERCOCET) 5-325 MG per tablet Take 2 tablets by mouth every 4 (four) hours as needed. 09/30/14   Charlesetta Shanks, MD  traMADol (ULTRAM) 50 MG tablet Take 1 tablet (50 mg total) by mouth every 6 (six) hours as needed. Patient not taking: Reported on 09/30/2014 06/01/14   Delos Haring, PA-C   BP 121/59 mmHg  Pulse 72  Temp(Src) 97.8 F (36.6 C) (Oral)  Resp 16  SpO2 99%  LMP 08/30/2014 (Approximate) Physical Exam  Constitutional: She is oriented to person, place, and time.  Patient is morbidly obese. She is nontoxic and alert. She appears to be in moderate pain.  HENT:  Head: Normocephalic and atraumatic.  Eyes: EOM are normal. Pupils are equal, round, and reactive to light.  Neck: Neck supple.   Cardiovascular: Normal rate, regular rhythm, normal heart sounds and intact distal pulses.   Pulmonary/Chest: Effort normal and breath sounds normal.  Abdominal: Soft. Bowel sounds are normal. She exhibits no distension. There is tenderness.  Moderate to severe tenderness in the left lower quadrant and in the mid lower quadrant and suprapubic region. No CVA tenderness, no rashes present. Skin quality normal. Groin is normal with symmetric femoral pulses and no mass or fullness in the inguinal  Musculoskeletal: Normal range of motion. She exhibits  no edema.  Neurological: She is alert and oriented to person, place, and time. She has normal strength. Coordination normal. GCS eye subscore is 4. GCS verbal subscore is 5. GCS motor subscore is 6.  Skin: Skin is warm, dry and intact.  Psychiatric: She has a normal mood and affect.    ED Course  Procedures (including critical care time) Labs Review Labs Reviewed  COMPREHENSIVE METABOLIC PANEL - Abnormal; Notable for the following:    Glucose, Bld 144 (*)    Albumin 3.4 (*)    ALT 12 (*)    All other components within normal limits  CBC - Abnormal; Notable for the following:    Hemoglobin 11.4 (*)    HCT 35.0 (*)    All other components within normal limits  URINALYSIS, ROUTINE W REFLEX MICROSCOPIC (NOT AT [facility]) - Abnormal; Notable for the following:    APPearance CLOUDY (*)    All other components within normal limits  LIPASE, BLOOD  I-STAT BETA HCG BLOOD, ED ([facility], [facility], [facility] ONLY)    Imaging Review Ct Abdomen Pelvis W Contrast  09/30/2014   CLINICAL DATA:  Left lower quadrant pain, diverticulitis  EXAM: CT ABDOMEN AND PELVIS WITH CONTRAST  TECHNIQUE: Multidetector CT imaging of the abdomen and pelvis was performed using the standard protocol following bolus administration of intravenous contrast.  CONTRAST:  29mL OMNIPAQUE IOHEXOL 300 MG/ML SOLN, 1104mL OMNIPAQUE IOHEXOL 300 MG/ML SOLN  COMPARISON:  06/10/2014  FINDINGS: The lung bases are  unremarkable.  Sagittal images of the spine are unremarkable. Stable hepatic cysts. No solid liver mass. Status postcholecystectomy. No gastric outlet obstruction. The pancreas, spleen and adrenal glands are unremarkable. No aortic aneurysm. Kidneys are symmetrical in size and enhancement. No hydronephrosis or hydroureter. Delayed renal images shows bilateral renal symmetrical excretion. Bilateral visualized proximal ureter is unremarkable.  Axial image 43 there is mild stranding of pericolonic fat in distal sigmoid colon in right lower quadrant. Multiple sigmoid colon diverticula are noted. Findings are highly suspicious for mild acute diverticulitis. This is confirmed in coronal image 51.  Moderate stool are noted in descending colon and sigmoid colon. Descending colon diverticula are noted without evidence of acute diverticulitis.  Axial image 66 there is mild nodular enhancement in left uterine fundus measures about 9.4 mm. This is highly suspicious for adenomyosis or focal endometrial hyperplasia. There is a left ovarian probable hemorrhagic cyst measures 4 cm. No pelvic free fluid. The right ovary is unremarkable.  IMPRESSION: 1. There is mild pericolonic stranding in right lower quadrant distal sigmoid colon with mild thickening of colonic wall. Multiple sigmoid colon diverticula. Findings are highly suspicious for mild diverticulitis. 2. Colonic diverticula are noted descending colon without evidence of acute diverticulitis. 3. Axial image 66 there is mild nodular enhancement in left uterine fundus measures about 9.4 mm. This is highly suspicious for adenomyosis or focal endometrial hyperplasia. There is a left ovarian probable hemorrhagic cyst measures 4 cm. No pelvic free fluid. Correlation with GYN exam and follow-up pelvic ultrasound is recommended as clinically warranted.   Electronically Signed   By: Lahoma Crocker M.D.   On: 09/30/2014 15:08   I have personally reviewed and evaluated these images and  lab results as part of my medical decision-making.   EKG Interpretation None      MDM   Final diagnoses:  Cyst of left ovary  Diverticulitis of large intestine without perforation or abscess without bleeding   Patient has mild findings of diverticulitis on her CT scan. The  most prominent diverticular related CT findings are on the right, the severity of the patient's pain however focuses on the left. At this time I feel that her pain is more likely related to the identified hemorrhagic ovarian cyst with very sharp quality severe pain. Patient will be treated for the diverticulitis with Cipro and Flagyl. She is having nonbloody diarrhea and no fever. Patient is advised to follow-up with GYN for ovarian cyst and intrauterine adenomyosis.     Charlesetta Shanks, MD 10/01/14 [phone]

## 2014-09-30 NOTE — ED Notes (Signed)
Pt complaining of pain in lower left quadrant. States she believes her "diverticulitis is flaring back up." Complaining of nausea and diarrhea. Denies vomiting, fever/chills. States it's been on-going x1 week, getting increasingly worse

## 2014-09-30 NOTE — Discharge Instructions (Signed)
Diverticulitis Diverticulitis is when small pockets that have formed in your colon (large intestine) become infected or swollen. HOME CARE  Follow your doctor's instructions.  Follow a special diet if told by your doctor.  When you feel better, your doctor may tell you to change your diet. You may be told to eat a lot of fiber. Fruits and vegetables are good sources of fiber. Fiber makes it easier to poop (have bowel movements).  Take supplements or probiotics as told by your doctor.  Only take medicines as told by your doctor.  Keep all follow-up visits with your doctor. GET HELP IF:  Your pain does not get better.  You have a hard time eating food.  You are not pooping like normal. GET HELP RIGHT AWAY IF:  Your pain gets worse.  Your problems do not get better.  Your problems suddenly get worse.  You have a fever.  You keep throwing up (vomiting).  You have bloody or black, tarry poop (stool). MAKE SURE YOU:   Understand these instructions.  Will watch your condition.  Will get help right away if you are not doing well or get worse. Document Released: 07/19/2007 Document Revised: 02/04/2013 Document Reviewed: 12/25/2012 [facility] Patient Information 2015 [place], [place]. This information is not intended to replace advice given to you by your health care provider. Make sure you discuss any questions you have with your health care provider. Ovarian Cyst An ovarian cyst is a fluid-filled sac that forms on an ovary. The ovaries are small organs that produce eggs in women. Various types of cysts can form on the ovaries. Most are not cancerous. Many do not cause problems, and they often go away on their own. Some may cause symptoms and require treatment. Common types of ovarian cysts include:  Functional cysts--These cysts may occur every month during the menstrual cycle. This is normal. The cysts usually go away with the next menstrual cycle if the woman does not get  pregnant. Usually, there are no symptoms with a functional cyst.  Endometrioma cysts--These cysts form from the tissue that lines the uterus. They are also called "chocolate cysts" because they become filled with blood that turns brown. This type of cyst can cause pain in the lower abdomen during intercourse and with your menstrual period.  Cystadenoma cysts--This type develops from the cells on the outside of the ovary. These cysts can get very big and cause lower abdomen pain and pain with intercourse. This type of cyst can twist on itself, cut off its blood supply, and cause severe pain. It can also easily rupture and cause a lot of pain.  Dermoid cysts--This type of cyst is sometimes found in both ovaries. These cysts may contain different kinds of body tissue, such as skin, teeth, hair, or cartilage. They usually do not cause symptoms unless they get very big.  Theca lutein cysts--These cysts occur when too much of a certain hormone (human chorionic gonadotropin) is produced and overstimulates the ovaries to produce an egg. This is most common after procedures used to assist with the conception of a baby (in vitro fertilization). CAUSES   Fertility drugs can cause a condition in which multiple large cysts are formed on the ovaries. This is called ovarian hyperstimulation syndrome.  A condition called polycystic ovary syndrome can cause hormonal imbalances that can lead to nonfunctional ovarian cysts. SIGNS AND SYMPTOMS  Many ovarian cysts do not cause symptoms. If symptoms are present, they may include:  Pelvic pain or pressure.  Pain in the lower abdomen.  Pain during sexual intercourse.  Increasing girth (swelling) of the abdomen.  Abnormal menstrual periods.  Increasing pain with menstrual periods.  Stopping having menstrual periods without being pregnant. DIAGNOSIS  These cysts are commonly found during a routine or annual pelvic exam. Tests may be ordered to find out more  about the cyst. These tests may include:  Ultrasound.  X-ray of the pelvis.  CT scan.  MRI.  Blood tests. TREATMENT  Many ovarian cysts go away on their own without treatment. Your health care provider may want to check your cyst regularly for 2-3 months to see if it changes. For women in menopause, it is particularly important to monitor a cyst closely because of the higher rate of ovarian cancer in menopausal women. When treatment is needed, it may include any of the following:  A procedure to drain the cyst (aspiration). This may be done using a long needle and ultrasound. It can also be done through a laparoscopic procedure. This involves using a thin, lighted tube with a tiny camera on the end (laparoscope) inserted through a small incision.  Surgery to remove the whole cyst. This may be done using laparoscopic surgery or an open surgery involving a larger incision in the lower abdomen.  Hormone treatment or birth control pills. These methods are sometimes used to help dissolve a cyst. HOME CARE INSTRUCTIONS   Only take over-the-counter or prescription medicines as directed by your health care provider.  Follow up with your health care provider as directed.  Get regular pelvic exams and Pap tests. SEEK MEDICAL CARE IF:   Your periods are late, irregular, or painful, or they stop.  Your pelvic pain or abdominal pain does not go away.  Your abdomen becomes larger or swollen.  You have pressure on your bladder or trouble emptying your bladder completely.  You have pain during sexual intercourse.  You have feelings of fullness, pressure, or discomfort in your stomach.  You lose weight for no apparent reason.  You feel generally ill.  You become constipated.  You lose your appetite.  You develop acne.  You have an increase in body and facial hair.  You are gaining weight, without changing your exercise and eating habits.  You think you are pregnant. SEEK  IMMEDIATE MEDICAL CARE IF:   You have increasing abdominal pain.  You feel sick to your stomach (nauseous), and you throw up (vomit).  You develop a fever that comes on suddenly.  You have abdominal pain during a bowel movement.  Your menstrual periods become heavier than usual. MAKE SURE YOU:  Understand these instructions.  Will watch your condition.  Will get help right away if you are not doing well or get worse. Document Released: 01/30/2005 Document Revised: 02/04/2013 Document Reviewed: 10/07/2012 [facility] Patient Information 2015 [place], [place]. This information is not intended to replace advice given to you by your health care provider. Make sure you discuss any questions you have with your health care provider.  Emergency Department Resource Guide 1) Find a Doctor and Pay Out of Pocket Although you won't have to find out who is covered by your insurance plan, it is a good idea to ask around and get recommendations. You will then need to call the office and see if the doctor you have chosen will accept you as a new patient and what types of options they offer for patients who are self-pay. Some doctors offer discounts or will set up payment plans for  their patients who do not have insurance, but you will need to ask so you aren't surprised when you get to your appointment.  2) Contact Your Local Health Department Not all health departments have doctors that can see patients for sick visits, but many do, so it is worth a call to see if yours does. If you don't know where your local health department is, you can check in your phone book. The CDC also has a tool to help you locate your state's health department, and many state websites also have listings of all of their local health departments.  3) Find a [facility] If your illness is not likely to be very severe or complicated, you may want to try a walk in clinic. These are popping up all over the country in pharmacies,  drugstores, and shopping centers. They're usually staffed by nurse practitioners or physician assistants that have been trained to treat common illnesses and complaints. They're usually fairly quick and inexpensive. However, if you have serious medical issues or chronic medical problems, these are probably not your best option.  No Primary Care Doctor: - Call Health Connect at  [phone] - they can help you locate a primary care doctor that  accepts your insurance, provides certain services, etc. - Physician Referral Service- [phone]  Chronic Pain Problems: Organization         Address  Phone   Notes  [facility]  [phone] Patients need to be referred by their primary care doctor.   Medication Assistance: Organization         Address  Phone   Notes  [facility] Medication [facility] [address] [place], [place] [address] [phone] --Must be a resident of [facility] -- Must have NO insurance coverage whatsoever (no Medicaid/ Medicare, etc.) -- The pt. MUST have a primary care doctor that directs their care regularly and follows them in the community   MedAssist  [phone]   [employer]  [phone]    Agencies that provide inexpensive medical care: Organization         Address  Phone   Notes  [place]  [phone]   [facility]    [phone]   [facility] [place], [place] [address] [phone]   [place] [address] [place] [phone]   Planned Parenthood    [phone]   [facility]    [phone]   [place] and [place] [address], [place] Phone:  [phone], Fax:  [phone] Hours of Operation:  9 am - 6 pm, M-F.  Also accepts Medicaid/Medicare and self-pay.  [facility] for [place], [place] Phone:  [phone], Fax: [phone]. Hours of Operation:  8:30 am - 5:30 pm, M-F.  Also accepts Medicaid and self-pay.  [facility] [address] [place] Phone: [phone]   [place], [place], [place] [phone], Ext. [phone] Mondays & Thursdays: 7-9 AM.  First 15 patients are seen on a first come, first serve basis.    [place] Providers:  Organization         Address  Phone   Notes  [facility] [address], [place] [phone] Also accepts self-pay patients.  [facility] [address], [place]  [phone]   [place], [address], [place] [phone]   [facility] [address] [place] [phone]   Lucianne Lei [address], [place]   [phone] Only accepts [place] Access [place] patients after they have their name applied to their card.   Self-Pay (no insurance) in [facility]:  Organization         Address  Phone   Notes  Sickle Cell Patients, [facility] [place] [phone]   [facility] [place] [phone]   [facility] [place]  [place], [place], [place] [phone]   [facility]/Dr. Osei-Bonsu  [address], [place] or [address], [place] [phone] Phone number for both [place] and [place] locations is the same.  Urgent Medical and [facility] [address], [place] [phone]   [facility] [address], [place] or [address] [phone]   [facility] [address] [place] [phone], phone; [phone], fax Sees patients 1st and 3rd Saturday of every month.  Must not qualify for public  or private insurance (i.e. Medicaid, Medicare, [place] Health Choice, Veterans' Benefits)  Household income should be no more than 200% of the poverty level The clinic cannot treat you if you are pregnant or think you are pregnant  Sexually transmitted diseases are not treated at the clinic.    Dental Care: Organization         Address  Phone  Notes  [facility] Department of [facility] [place] [phone] Accepts children up to age 58 who are enrolled in [place] or [place]; pregnant women with a Medicaid card; and children who have applied for Medicaid or [place] Health Choice, but were declined, whose parents can pay a reduced fee at time of service.  [facility] Department of [facility]  [address], [place] [phone] Accepts children up to age 73 who are enrolled in [place] or [place]; pregnant women with a Medicaid card; and children who have applied for Medicaid or [place] Health Choice, but were declined, whose parents can pay a reduced fee at time of service.  [place] Adult Dental Access PROGRAM  [place] [phone] Patients are seen by appointment only. Walk-ins are not accepted. [place] will see patients 72 years of age and older. Monday - Tuesday (8am-5pm) Most Wednesdays (8:30-5pm) $30 per visit, cash only  [facility] Adult Dental Access PROGRAM  [address], [facility] [phone] Patients are seen by appointment only. Walk-ins are not accepted. [place] will see patients 42 years of age and older. One Wednesday Evening (Monthly: Volunteer Based).  $30 per visit, cash only  [place]  [phone] for adults; Children under age 37, call Graduate Pediatric Dentistry at [phone]. Children aged 95-14, please call [phone] to request a pediatric application.  Dental services are provided in all areas of  dental care including fillings, crowns and bridges, complete and partial dentures, implants, gum treatment, root canals, and extractions. Preventive care is also provided. Treatment is provided to both adults  and children. Patients are selected via a lottery and there is often a waiting list.   [facility] [address] [place]  [phone] [email or web address]   [facility] [address] [place], [place] [phone], Ext. [phone] Second and Fourth Thursday of each month, opens at 6:30 AM; Clinic ends at 9 AM.  Patients are seen on a first-come first-served basis, and a limited number are seen during each clinic.   [facility]  [address] [place]ard Danker [place], [place] [phone]   Eligibility Requirements You must have lived in [place], [place], or [place] counties for at least the last three months.   You cannot be eligible for state or federal sponsored [employer], including [employer], [place], or [employer].   You generally cannot be eligible for healthcare insurance through your employer.    How to apply: Eligibility screenings are held every Tuesday and Wednesday afternoon from 1:00 pm until 4:00 pm. You do not need an appointment for the interview!  [facility] [address] [place], [place]   [place]  [place] Department  [place]  [phone]    Behavioral Health Resources in the Community: Intensive Outpatient Programs Organization         Address  Phone  Notes  [place]. [address], [place], [place] [phone]   [facility] Outpatient [address], [place], [place]   ADS: Alcohol & Drug Svcs [address] [place], [place]   [place] [address],  [place], [place] or  [phone]   Substance Abuse Resources Organization         Address  Phone  Notes  Alcohol and Drug Services  [phone]   [place]  [phone]   [facility] [place]   Chinita Pester  [phone]   Residential & Outpatient Substance Abuse Program  [phone]   Psychological Services Organization         Address  Phone  Notes  [facility] [place]  [place]  [phone]   [place] [address] [place] or [phone]    Mobile Crisis Teams Organization         Address  Phone  Notes  Therapeutic Alternatives, Mobile Crisis Care Unit  [phone]   Assertive Psychotherapeutic Services  [address] [place], [place]   [place] Levels [address] [place] [phone]    Self-Help/Support Groups Organization         Address  Phone             Notes  [place]. of [place] - variety of support groups  [place] Call for more information  Narcotics Anonymous (NA), Caring Services [address], [employer] [place]  2 meetings at this location   Special educational needs teacher         Address  Phone  Notes  ASAP Residential Treatment [place],    [place]  [phone]   [facility]  [address], [place] [phone], [place], [place]   [place], [place] [phone] Admissions: 8am-3pm M-F  Incentives Substance [place] [address],    [place], [place] [phone]   [facility] [address] [place], [place], [place]   The [place].,  [place], [place]   Insight Programs - Intensive Outpatient [address], [address], [place]   [facility] [address]  [place], [place] [phone] or [phone]   Residential  Treatment Services (RTS) [address], [place], [place] Accepts Medicaid  [facility] [place] [address]  [place] [phone] Substance Abuse/Addiction Treatment   [facility] Organization         Address  Phone  Notes  [facility]  [phone]   Domenic Schwab, PhD [address] Arlis Porta [place], [place]   [phone] or [phone]   [place], [place] [phone]   [address], [place], [place] [phone] Insurance/Medicaid/sponsorship through [facility] and Families [address], [address] [place]                                    [place], [place] [phone] [place] [address], [place] [phone]    Dr. Adele Schilder  [phone]   [facility] Dept. 1) [address] [place] 2) [place] 3)  [address], [place] [phone]  [phone]   [place] [phone] or [phone] (After Hours)

## 2014-09-30 NOTE — ED Notes (Addendum)
Unable to achieve IV access. Will have secondary RN f/u. EDP notified as well

## 2014-10-07 ENCOUNTER — Other Ambulatory Visit (HOSPITAL_COMMUNITY)
Admission: RE | Admit: 2014-10-07 | Discharge: 2014-10-07 | Disposition: A | Discharge status: Home / Self Care | Payer: Self-pay | Source: Ambulatory Visit | Attending: Obstetrics & Gynecology | Admitting: Obstetrics & Gynecology

## 2014-10-07 ENCOUNTER — Ambulatory Visit (INDEPENDENT_AMBULATORY_CARE_PROVIDER_SITE_OTHER): Payer: Self-pay | Admitting: Obstetrics & Gynecology

## 2014-10-07 VITALS — BP 106/61 | HR 70 | Temp 98.5°F | Wt 249.1 lb

## 2014-10-07 DIAGNOSIS — N920 Excessive and frequent menstruation with regular cycle: Secondary | ICD-10-CM | POA: Insufficient documentation
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
## 2014-10-12 ENCOUNTER — Telehealth: Payer: Self-pay | Admitting: Obstetrics & Gynecology

## 2014-10-20 ENCOUNTER — Other Ambulatory Visit (HOSPITAL_COMMUNITY): Payer: Self-pay | Admitting: Obstetrics & Gynecology

## 2014-10-20 DIAGNOSIS — Z1231 Encounter for screening mammogram for malignant neoplasm of breast: Secondary | ICD-10-CM

## 2014-10-22 ENCOUNTER — Encounter: Payer: Self-pay | Admitting: Obstetrics & Gynecology

## 2014-10-22 ENCOUNTER — Ambulatory Visit (INDEPENDENT_AMBULATORY_CARE_PROVIDER_SITE_OTHER): Payer: Self-pay | Admitting: Obstetrics & Gynecology

## 2014-10-22 VITALS — Temp 98.5°F | Wt 251.2 lb

## 2014-10-22 DIAGNOSIS — N920 Excessive and frequent menstruation with regular cycle: Secondary | ICD-10-CM

## 2014-10-22 NOTE — Progress Notes (Signed)
   Subjective:    Patient ID: Crystal Brennan, female    DOB: 1965/07/31, 49 y.o.   MRN: [id number]  HPI  49 yo AA lady is here for results of her EMBX.   Review of Systems     Objective:   Physical Exam WNWHobese BFNAD Breathing, conversing, and ambulating normally Pathology normal     Assessment & Plan:  Menorrhagia- We discussed Mirena versus endometrial ablation. She would like the ablation. She understands that there are risks of surgery.

## 2014-10-27 ENCOUNTER — Ambulatory Visit (HOSPITAL_COMMUNITY)
Admission: RE | Admit: 2014-10-27 | Discharge: 2014-10-27 | Disposition: A | Discharge status: Home / Self Care | Payer: Self-pay | Source: Ambulatory Visit | Attending: Obstetrics & Gynecology | Admitting: Obstetrics & Gynecology

## 2014-10-27 DIAGNOSIS — Z1231 Encounter for screening mammogram for malignant neoplasm of breast: Secondary | ICD-10-CM

## 2014-10-29 ENCOUNTER — Encounter (HOSPITAL_COMMUNITY): Payer: Self-pay | Admitting: *Deleted

## 2014-12-21 NOTE — Progress Notes (Signed)
HPI: FU atrial fibrillation; admitted 11/15 with new onset atrial fibrillation; TSH normal. Lexiscan cardiolite was completed and negative for ischemia; EF 78. Echocardiogram revealed an EF of 65-70% with normal wall motion. Mild concentric hypertrophy. G1DD. Peak PA pressure of 23mmHg. Converted with cardizem. Since last seen,   Current Outpatient Prescriptions  Medication Sig Dispense Refill  . apixaban (ELIQUIS) 5 MG TABS tablet Take 1 tablet (5 mg total) by mouth 2 (two) times daily. 180 tablet 3  . diltiazem (CARDIZEM CD) 180 MG 24 hr capsule Take 1 capsule (180 mg total) by mouth daily. 30 capsule 11  . hydrochlorothiazide (HYDRODIURIL) 25 MG tablet Take 25 mg by mouth daily.    Mar[place] Kitchen ketorolac (TORADOL) 10 MG tablet Take 1 tablet (10 mg total) by mouth every 6 (six) hours as needed. (Patient not taking: Reported on 10/07/2014) 20 tablet 0  . metoprolol (LOPRESSOR) 50 MG tablet Take 1 tablet (50 mg total) by mouth 2 (two) times daily. 180 tablet 3  . traMADol (ULTRAM) 50 MG tablet Take 1 tablet (50 mg total) by mouth every 6 (six) hours as needed. 20 tablet 0   No current facility-administered medications for this visit.     Past Medical History  Diagnosis Date  . Chronic back pain     "upper" (01/01/2014)  . Hypertension   . Depression   . Anxiety   . Anemia   . Menometrorrhagia   . Insomnia   . Diverticulitis   . Sickle cell trait   . Cardiomegaly     a. 2011 Echo: EF 65-70%, Gr 1 DD, nl wall motion.  . Morbid obesity   . Tobacco abuse   . Palpitations     a. Date back to 08/2013.  . Diverticulitis     a. 09/2013  . Family history of adverse reaction to anesthesia     "Mom gets so sick off of it"  . Heart murmur   . Chronic bronchitis     "get it ~ q yr" (01/01/2014)  . GERD (gastroesophageal reflux disease)   . Headache     "monthly" (01/01/2014)  . Migraine     "monthly" (01/01/2014)  . Arthritis     "qwhere"    Past Surgical History  Procedure  Laterality Date  . Cholecystectomy  2009  . Tubal ligation  198  . Cesarean section  2409; 1986; 1988    Social History   Social History  . Marital Status: Single    Spouse Name: N/A  . Number of Children: N/A  . Years of Education: N/A   Occupational History  . Not on file.   Social History Main Topics  . Smoking status: Former Smoker -- 0.33 packs/day for 30 years    Types: Cigarettes    Quit date: 12/31/2013  . Smokeless tobacco: Not on file     Comment: pt smoke electronic cigarette  . Alcohol Use: 1.8 oz/week    0 Standard drinks or equivalent, 3 Glasses of wine per week  . Drug Use: No  . Sexual Activity: Not on file   Other Topics Concern  . Not on file   Social History Narrative   Lives in [place] with fiance.  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  Walks regularly.  Trying to lose weight.    ROS: no fevers or chills, productive cough, hemoptysis, dysphasia, odynophagia, melena, hematochezia, dysuria, hematuria, rash, seizure activity, orthopnea, PND, pedal edema, claudication. Remaining systems are negative.  Physical Exam: Well-developed well-nourished in no acute distress.  Skin is warm and dry.  HEENT is normal.  Neck is supple.  Chest is clear to auscultation with normal expansion.  Cardiovascular exam is regular rate and rhythm.  Abdominal exam nontender or distended. No masses palpated. Extremities show no edema. neuro grossly intact  ECG     This encounter was created in error - please disregard.

## 2014-12-28 ENCOUNTER — Encounter: Payer: Self-pay | Admitting: Cardiology

## 2014-12-30 ENCOUNTER — Ambulatory Visit (HOSPITAL_COMMUNITY): Admission: RE | Admit: 2014-12-30 | Payer: Self-pay | Source: Ambulatory Visit | Admitting: Obstetrics & Gynecology

## 2014-12-30 ENCOUNTER — Encounter (HOSPITAL_COMMUNITY): Admission: RE | Payer: Self-pay | Source: Ambulatory Visit

## 2014-12-30 SURGERY — HYSTEROSCOPY WITH NOVASURE
Anesthesia: Choice | Site: Vagina

## 2015-01-21 ENCOUNTER — Telehealth: Payer: Self-pay | Admitting: Cardiology

## 2015-01-25 NOTE — Telephone Encounter (Signed)
Close encounter 

## 2015-01-25 NOTE — Progress Notes (Signed)
HPI: FU atrial fibrillation; admitted 11/15 with new onset atrial fibrillation; TSH normal. Lexiscan cardiolite was completed and negative for ischemia; EF 78. Echocardiogram revealed an EF of 65-70% with normal wall motion. Mild concentric hypertrophy. G1DD. Peak PA pressure of 57mmHg. Converted with cardizem. Since last seen, She denies dyspnea, chest pain, palpitations, syncope or bleeding.   Current Outpatient Prescriptions  Medication Sig Dispense Refill  . apixaban (ELIQUIS) 5 MG TABS tablet Take 1 tablet (5 mg total) by mouth 2 (two) times daily. 180 tablet 3  . diltiazem (CARDIZEM CD) 180 MG 24 hr capsule Take 1 capsule (180 mg total) by mouth daily. 30 capsule 11  . hydrochlorothiazide (HYDRODIURIL) 25 MG tablet Take 25 mg by mouth daily.    Marland Kitchen ketorolac (TORADOL) 10 MG tablet Take 1 tablet (10 mg total) by mouth every 6 (six) hours as needed. 20 tablet 0  . metoprolol (LOPRESSOR) 50 MG tablet Take 1 tablet (50 mg total) by mouth 2 (two) times daily. 180 tablet 3   No current facility-administered medications for this visit.     Past Medical History  Diagnosis Date  . Chronic back pain     "upper" (01/01/2014)  . Hypertension   . Depression   . Anxiety   . Anemia   . Menometrorrhagia   . Insomnia   . Diverticulitis   . Sickle cell trait ([place])   . Cardiomegaly     a. 2011 Echo: EF 65-70%, Gr 1 DD, nl wall motion.  . Morbid obesity ([place])   . Tobacco abuse   . Palpitations     a. Date back to 08/2013.  . Diverticulitis     a. 09/2013  . Family history of adverse reaction to anesthesia     "Mom gets so sick off of it"  . Heart murmur   . Chronic bronchitis ([place])     "get it ~ q yr" (01/01/2014)  . GERD (gastroesophageal reflux disease)   . Headache     "monthly" (01/01/2014)  . Migraine     "monthly" (01/01/2014)  . Arthritis     "qwhere"    Past Surgical History  Procedure Laterality Date  . Cholecystectomy  2009  . Tubal ligation  198  . Cesarean  section  [id number]; 1986; 1988    Social History   Social History  . Marital Status: Married    Spouse Name: N/A  . Number of Children: N/A  . Years of Education: N/A   Occupational History  . Not on file.   Social History Main Topics  . Smoking status: Former Smoker -- 0.33 packs/day for 30 years    Types: Cigarettes    Quit date: 12/31/2013  . Smokeless tobacco: Not on file     Comment: pt smoke electronic cigarette  . Alcohol Use: 1.8 oz/week    0 Standard drinks or equivalent, 3 Glasses of wine per week  . Drug Use: No  . Sexual Activity: Not on file   Other Topics Concern  . Not on file   Social History Narrative   Lives in [place] with fiance.  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  Walks regularly.  Trying to lose weight.    ROS: no fevers or chills, productive cough, hemoptysis, dysphasia, odynophagia, melena, hematochezia, dysuria, hematuria, rash, seizure activity, orthopnea, PND, pedal edema, claudication. Remaining systems are negative.  Physical Exam: Well-developed obese in no acute distress.  Skin is warm and dry.  HEENT is  normal.  Neck is supple.  Chest is clear to auscultation with normal expansion.  Cardiovascular exam is regular rate and rhythm.  Abdominal exam nontender or distended. No masses palpated. Extremities show no edema. neuro grossly intact  ECG Normal sinus rhythm, no ST changes.

## 2015-01-26 ENCOUNTER — Telehealth: Payer: Self-pay | Admitting: Cardiology

## 2015-01-26 ENCOUNTER — Ambulatory Visit (INDEPENDENT_AMBULATORY_CARE_PROVIDER_SITE_OTHER): Payer: Self-pay | Admitting: Cardiology

## 2015-01-26 ENCOUNTER — Encounter: Payer: Self-pay | Admitting: Cardiology

## 2015-01-26 VITALS — BP 122/76 | HR 71 | Ht 62.0 in | Wt 256.1 lb

## 2015-01-26 DIAGNOSIS — Z79899 Other long term (current) drug therapy: Secondary | ICD-10-CM

## 2015-01-26 DIAGNOSIS — I480 Paroxysmal atrial fibrillation: Secondary | ICD-10-CM

## 2015-01-26 DIAGNOSIS — I517 Cardiomegaly: Secondary | ICD-10-CM

## 2015-01-26 DIAGNOSIS — I4891 Unspecified atrial fibrillation: Secondary | ICD-10-CM

## 2015-01-26 DIAGNOSIS — Z720 Tobacco use: Secondary | ICD-10-CM

## 2015-01-26 MED ORDER — APIXABAN 5 MG PO TABS: 5.0000 mg | ORAL_TABLET | Freq: Two times a day (BID) | ORAL | Status: DC

## 2015-01-26 MED ORDER — METOPROLOL TARTRATE 50 MG PO TABS: 50.0000 mg | ORAL_TABLET | Freq: Two times a day (BID) | ORAL | Status: DC

## 2015-01-26 MED ORDER — HYDROCHLOROTHIAZIDE 25 MG PO TABS: 25.0000 mg | ORAL_TABLET | Freq: Every day | ORAL | Status: DC

## 2015-01-26 MED ORDER — DILTIAZEM HCL ER COATED BEADS 180 MG PO CP24
180.0000 mg | ORAL_CAPSULE | Freq: Every day | ORAL | Status: DC
Start: 2015-01-26 — End: 2015-12-30

## 2015-01-26 NOTE — Assessment & Plan Note (Signed)
Discussed need for diet, exercise and weight loss.

## 2015-01-26 NOTE — Assessment & Plan Note (Signed)
Patient has discontinued. 

## 2015-01-26 NOTE — Patient Instructions (Signed)
Dr Stanford Breed has made no changes today in your current medications or treatment plan.  Your physician recommends that you return for lab work TODAY.  Your physician recommends that you schedule a follow-up appointment in 6 months. You will receive a reminder letter in the mail two months in advance. If you don't receive a letter, please call our office to schedule the follow-up appointment.  If you need a refill on your cardiac medications before your next appointment, please call your pharmacy.

## 2015-01-26 NOTE — Telephone Encounter (Signed)
Received a call from [facility] with [place] lab.Patient does not have insurance is it ok to wait and have cbc,bmet 02/2015, she will have insurance then.Spoke to [facility] he advised ok to have done 02/2015.

## 2015-01-26 NOTE — Assessment & Plan Note (Signed)
Patient remains in sinus rhythm. Continue Cardizem, beta blocker and apixaban., Check hemoglobin and renal function. Note patient has had episodes of atrial fibrillation following consumption of alcohol. However she has discontinued this and has had no recurrent atrial fibrillation.

## 2015-01-26 NOTE — Assessment & Plan Note (Signed)
Blood pressure controlled. Continue present medications. Check potassium and renal function. 

## 2015-02-20 ENCOUNTER — Encounter (HOSPITAL_COMMUNITY): Payer: Self-pay | Admitting: Emergency Medicine

## 2015-02-20 ENCOUNTER — Emergency Department (HOSPITAL_COMMUNITY): Payer: Commercial Managed Care - PPO

## 2015-02-20 ENCOUNTER — Inpatient Hospital Stay (HOSPITAL_COMMUNITY)
Admission: EM | Admit: 2015-02-20 | Discharge: 2015-03-06 | DRG: 392 | Disposition: A | Discharge status: Home / Self Care | Payer: Commercial Managed Care - PPO | Attending: Internal Medicine | Admitting: Internal Medicine

## 2015-02-20 DIAGNOSIS — G8929 Other chronic pain: Secondary | ICD-10-CM | POA: Diagnosis present

## 2015-02-20 DIAGNOSIS — K5732 Diverticulitis of large intestine without perforation or abscess without bleeding: Secondary | ICD-10-CM

## 2015-02-20 DIAGNOSIS — Z7901 Long term (current) use of anticoagulants: Secondary | ICD-10-CM

## 2015-02-20 DIAGNOSIS — K5720 Diverticulitis of large intestine with perforation and abscess without bleeding: Principal | ICD-10-CM | POA: Diagnosis present

## 2015-02-20 DIAGNOSIS — I5032 Chronic diastolic (congestive) heart failure: Secondary | ICD-10-CM | POA: Diagnosis present

## 2015-02-20 DIAGNOSIS — F329 Major depressive disorder, single episode, unspecified: Secondary | ICD-10-CM | POA: Diagnosis present

## 2015-02-20 DIAGNOSIS — K668 Other specified disorders of peritoneum: Secondary | ICD-10-CM | POA: Diagnosis present

## 2015-02-20 DIAGNOSIS — E46 Unspecified protein-calorie malnutrition: Secondary | ICD-10-CM | POA: Diagnosis present

## 2015-02-20 DIAGNOSIS — I10 Essential (primary) hypertension: Secondary | ICD-10-CM | POA: Diagnosis present

## 2015-02-20 DIAGNOSIS — D573 Sickle-cell trait: Secondary | ICD-10-CM | POA: Diagnosis present

## 2015-02-20 DIAGNOSIS — Z8614 Personal history of Methicillin resistant Staphylococcus aureus infection: Secondary | ICD-10-CM

## 2015-02-20 DIAGNOSIS — K5792 Diverticulitis of intestine, part unspecified, without perforation or abscess without bleeding: Secondary | ICD-10-CM

## 2015-02-20 DIAGNOSIS — Z720 Tobacco use: Secondary | ICD-10-CM

## 2015-02-20 DIAGNOSIS — Z6841 Body Mass Index (BMI) 40.0 and over, adult: Secondary | ICD-10-CM | POA: Diagnosis not present

## 2015-02-20 DIAGNOSIS — D473 Essential (hemorrhagic) thrombocythemia: Secondary | ICD-10-CM | POA: Diagnosis present

## 2015-02-20 DIAGNOSIS — G4733 Obstructive sleep apnea (adult) (pediatric): Secondary | ICD-10-CM | POA: Diagnosis present

## 2015-02-20 DIAGNOSIS — Z79899 Other long term (current) drug therapy: Secondary | ICD-10-CM

## 2015-02-20 DIAGNOSIS — D638 Anemia in other chronic diseases classified elsewhere: Secondary | ICD-10-CM | POA: Diagnosis present

## 2015-02-20 DIAGNOSIS — Z9049 Acquired absence of other specified parts of digestive tract: Secondary | ICD-10-CM | POA: Diagnosis not present

## 2015-02-20 DIAGNOSIS — Z87891 Personal history of nicotine dependence: Secondary | ICD-10-CM

## 2015-02-20 DIAGNOSIS — I480 Paroxysmal atrial fibrillation: Secondary | ICD-10-CM | POA: Diagnosis present

## 2015-02-20 DIAGNOSIS — K651 Peritoneal abscess: Secondary | ICD-10-CM

## 2015-02-20 DIAGNOSIS — M549 Dorsalgia, unspecified: Secondary | ICD-10-CM | POA: Diagnosis present

## 2015-02-20 DIAGNOSIS — E876 Hypokalemia: Secondary | ICD-10-CM | POA: Diagnosis present

## 2015-02-20 DIAGNOSIS — R109 Unspecified abdominal pain: Secondary | ICD-10-CM | POA: Diagnosis present

## 2015-02-20 DIAGNOSIS — K56609 Unspecified intestinal obstruction, unspecified as to partial versus complete obstruction: Secondary | ICD-10-CM

## 2015-02-20 DIAGNOSIS — N739 Female pelvic inflammatory disease, unspecified: Secondary | ICD-10-CM | POA: Insufficient documentation

## 2015-02-20 DIAGNOSIS — I959 Hypotension, unspecified: Secondary | ICD-10-CM | POA: Diagnosis present

## 2015-02-20 DIAGNOSIS — D75839 Thrombocytosis, unspecified: Secondary | ICD-10-CM | POA: Diagnosis present

## 2015-02-20 DIAGNOSIS — F419 Anxiety disorder, unspecified: Secondary | ICD-10-CM | POA: Diagnosis present

## 2015-02-20 HISTORY — DX: Sleep apnea, unspecified: G47.30

## 2015-02-20 LAB — CBC
HEMATOCRIT: 35.3 % — AB (ref 36.0–46.0)
Hemoglobin: 11.7 g/dL — ABNORMAL LOW (ref 12.0–15.0)
MCH: 27.7 pg (ref 26.0–34.0)
MCHC: 33.1 g/dL (ref 30.0–36.0)
MCV: 83.5 fL (ref 78.0–100.0)
Platelets: 328 10*3/uL (ref 150–400)
RBC: 4.23 MIL/uL (ref 3.87–5.11)
RDW: 14.9 % (ref 11.5–15.5)
WBC: 10.4 10*3/uL (ref 4.0–10.5)

## 2015-02-20 LAB — COMPREHENSIVE METABOLIC PANEL
ALT: 15 U/L (ref 14–54)
AST: 21 U/L (ref 15–41)
Albumin: 3.8 g/dL (ref 3.5–5.0)
Alkaline Phosphatase: 45 U/L (ref 38–126)
Anion gap: 11 (ref 5–15)
BUN: 7 mg/dL (ref 6–20)
CHLORIDE: 102 mmol/L (ref 101–111)
CO2: 29 mmol/L (ref 22–32)
CREATININE: 0.85 mg/dL (ref 0.44–1.00)
Calcium: 8.7 mg/dL — ABNORMAL LOW (ref 8.9–10.3)
Glucose, Bld: 135 mg/dL — ABNORMAL HIGH (ref 65–99)
POTASSIUM: 2.9 mmol/L — AB (ref 3.5–5.1)
SODIUM: 142 mmol/L (ref 135–145)
Total Bilirubin: 1 mg/dL (ref 0.3–1.2)
Total Protein: 7.2 g/dL (ref 6.5–8.1)

## 2015-02-20 LAB — LIPASE, BLOOD: LIPASE: 19 U/L (ref 11–51)

## 2015-02-20 LAB — MAGNESIUM: MAGNESIUM: 1.7 mg/dL (ref 1.7–2.4)

## 2015-02-20 MED ORDER — ONDANSETRON HCL 4 MG/2ML IJ SOLN
4.0000 mg | Freq: Once | INTRAMUSCULAR | Status: AC
  Administered 2015-02-20: 4 mg via INTRAVENOUS
  Filled 2015-02-20: qty 2

## 2015-02-20 MED ORDER — FENTANYL CITRATE (PF) 100 MCG/2ML IJ SOLN
50.0000 ug | Freq: Once | INTRAMUSCULAR | Status: AC
  Administered 2015-02-20: 50 ug via INTRAVENOUS
  Filled 2015-02-20: qty 2

## 2015-02-20 MED ORDER — CIPROFLOXACIN IN D5W 400 MG/200ML IV SOLN
400.0000 mg | Freq: Once | INTRAVENOUS | Status: DC
  Filled 2015-02-20 (×2): qty 200

## 2015-02-20 MED ORDER — FENTANYL CITRATE (PF) 100 MCG/2ML IJ SOLN: 25.0000 ug | INTRAMUSCULAR | Status: DC | PRN

## 2015-02-20 MED ORDER — POTASSIUM CHLORIDE 10 MEQ/100ML IV SOLN
10.0000 meq | INTRAVENOUS | Status: AC
  Administered 2015-02-21 (×5): 10 meq via INTRAVENOUS
  Filled 2015-02-20 (×5): qty 100

## 2015-02-20 MED ORDER — METRONIDAZOLE IN NACL 5-0.79 MG/ML-% IV SOLN
500.0000 mg | Freq: Three times a day (TID) | INTRAVENOUS | Status: DC
  Administered 2015-02-21 – 2015-02-22 (×4): 500 mg via INTRAVENOUS
  Filled 2015-02-20 (×5): qty 100

## 2015-02-20 MED ORDER — MORPHINE SULFATE (PF) 2 MG/ML IV SOLN
2.0000 mg | INTRAVENOUS | Status: DC | PRN
  Administered 2015-02-21: 4 mg via INTRAVENOUS
  Administered 2015-02-21: 2 mg via INTRAVENOUS
  Administered 2015-02-21: 4 mg via INTRAVENOUS
  Administered 2015-02-21: 2 mg via INTRAVENOUS
  Administered 2015-02-21 (×2): 4 mg via INTRAVENOUS
  Administered 2015-02-21: 2 mg via INTRAVENOUS
  Administered 2015-02-22 (×3): 4 mg via INTRAVENOUS
  Filled 2015-02-20: qty 2
  Filled 2015-02-20 (×2): qty 1
  Filled 2015-02-20 (×5): qty 2
  Filled 2015-02-20: qty 1
  Filled 2015-02-20: qty 2

## 2015-02-20 MED ORDER — SODIUM CHLORIDE 0.9 % IV BOLUS (SEPSIS)
1000.0000 mL | Freq: Once | INTRAVENOUS | Status: AC
  Administered 2015-02-20: 1000 mL via INTRAVENOUS

## 2015-02-20 MED ORDER — ACETAMINOPHEN 325 MG PO TABS
650.0000 mg | ORAL_TABLET | Freq: Four times a day (QID) | ORAL | Status: DC | PRN
  Administered 2015-02-21: 650 mg via ORAL
  Filled 2015-02-20 (×2): qty 2

## 2015-02-20 MED ORDER — METRONIDAZOLE IN NACL 5-0.79 MG/ML-% IV SOLN
500.0000 mg | Freq: Once | INTRAVENOUS | Status: AC
  Administered 2015-02-21: 500 mg via INTRAVENOUS
  Filled 2015-02-20: qty 100

## 2015-02-20 MED ORDER — ONDANSETRON HCL 4 MG/2ML IJ SOLN
4.0000 mg | Freq: Four times a day (QID) | INTRAMUSCULAR | Status: DC | PRN
  Administered 2015-02-21 – 2015-03-06 (×10): 4 mg via INTRAVENOUS
  Filled 2015-02-20 (×10): qty 2

## 2015-02-20 MED ORDER — SODIUM CHLORIDE 0.9 % IJ SOLN
3.0000 mL | Freq: Two times a day (BID) | INTRAMUSCULAR | Status: DC
  Administered 2015-02-21 – 2015-03-06 (×15): 3 mL via INTRAVENOUS

## 2015-02-20 MED ORDER — IOHEXOL 300 MG/ML  SOLN
100.0000 mL | Freq: Once | INTRAMUSCULAR | Status: AC | PRN
  Administered 2015-02-20: 100 mL via INTRAVENOUS

## 2015-02-20 MED ORDER — IOHEXOL 300 MG/ML  SOLN: 25.0000 mL | Freq: Once | INTRAMUSCULAR | Status: DC | PRN

## 2015-02-20 MED ORDER — ACETAMINOPHEN 650 MG RE SUPP: 650.0000 mg | Freq: Four times a day (QID) | RECTAL | Status: DC | PRN

## 2015-02-20 MED ORDER — SODIUM CHLORIDE 0.9 % IV SOLN
INTRAVENOUS | Status: DC
  Administered 2015-02-21: 07:00:00 via INTRAVENOUS

## 2015-02-20 MED ORDER — ONDANSETRON HCL 4 MG PO TABS
4.0000 mg | ORAL_TABLET | Freq: Four times a day (QID) | ORAL | Status: DC | PRN
  Administered 2015-03-06: 4 mg via ORAL
  Filled 2015-02-20: qty 1

## 2015-02-20 MED ORDER — POTASSIUM CHLORIDE 10 MEQ/100ML IV SOLN
10.0000 meq | INTRAVENOUS | Status: DC
  Filled 2015-02-20: qty 100

## 2015-02-20 MED ORDER — FENTANYL CITRATE (PF) 100 MCG/2ML IJ SOLN
50.0000 ug | Freq: Once | INTRAMUSCULAR | Status: DC
Start: 2015-02-20 — End: 2015-02-20
  Filled 2015-02-20: qty 2

## 2015-02-20 MED ORDER — IOHEXOL 300 MG/ML  SOLN
25.0000 mL | Freq: Once | INTRAMUSCULAR | Status: AC | PRN
  Administered 2015-02-20: 25 mL via ORAL

## 2015-02-20 MED ORDER — FENTANYL CITRATE (PF) 100 MCG/2ML IJ SOLN
50.0000 ug | Freq: Once | INTRAMUSCULAR | Status: AC
  Administered 2015-02-20: 50 ug via INTRAVENOUS

## 2015-02-20 MED ORDER — CIPROFLOXACIN IN D5W 400 MG/200ML IV SOLN
400.0000 mg | Freq: Two times a day (BID) | INTRAVENOUS | Status: DC
  Administered 2015-02-21 (×2): 400 mg via INTRAVENOUS
  Filled 2015-02-20 (×3): qty 200

## 2015-02-20 MED ORDER — METOPROLOL TARTRATE 1 MG/ML IV SOLN
2.5000 mg | Freq: Four times a day (QID) | INTRAVENOUS | Status: DC
  Administered 2015-02-21: 2.5 mg via INTRAVENOUS
  Filled 2015-02-20 (×4): qty 5

## 2015-02-20 NOTE — ED Notes (Signed)
Iv team consulted for iv and lab draw

## 2015-02-20 NOTE — ED Notes (Signed)
Per pt, states abdominal pain related to her diverticulitis

## 2015-02-20 NOTE — ED Notes (Signed)
Bed: WA13 Expected date:  Expected time:  Means of arrival:  Comments: Hold for triage 1 

## 2015-02-20 NOTE — ED Notes (Signed)
Attempted to get labs. Unable, IV team started IV and was unable to get labs

## 2015-02-20 NOTE — ED Notes (Signed)
Report called to unit. 

## 2015-02-20 NOTE — H&P (Signed)
PCP:  No PCP Per Patient  [facility]  Referring provider [facility]   Chief Complaint:   abdominal pain  HPI: Crystal Brennan is a 50 y.o. female   has a past medical history of Chronic back pain; Hypertension; Depression; Anxiety; Anemia; Menometrorrhagia; Insomnia; Diverticulitis; Sickle cell trait ([place]); Cardiomegaly; Morbid obesity ([place]); Tobacco abuse; Palpitations; Diverticulitis; Family history of adverse reaction to anesthesia; Heart murmur; Chronic bronchitis ([place]); GERD (gastroesophageal reflux disease); Headache; Migraine; and Arthritis.   Presented with 2 day history of abdominal pain similar to prior episodes of diverticulitis. Denies any nausea vomiting or diarrhea. She has been constipated. Reports severe pain, so bad hurts to even talk. CT scan was done in emergency department showing sigmoid diverticulitis areas associated with extraluminal air reflecting diverticular perforation no evidence of abscess. Case was discussed with surgeon Dr. Lucia Gaskins on call who felt that there is no indication for operative intervention at that time unless patient becomes unstable and patietn will need to e off eliquis for 72 hours prior to OR  They will consult in AM for now continue with IV antibiotics and fluids. She may be candidate for elective colectomy acute or recurrent episodes of diverticulitis in needs to be followed up with surgery at the time of discharge. In ER  patient was noted to be hypokalemic with potassium of 2.9 which is going to be replaced. Of note the patient had a transient hypotensive episode and ER done to 77/61 on repeat vitals were stable. She denies any fevers in emergency department was started on Flagyl and Cipro IV  Past history significant for Atrial fibrillation on metoprolol and anticoagulation she is followed for this by cardiology. Cardiolite was done showed no evidence of ischemia. Echogram showed EF 65-60 normal wall Motion was noted to have grade 1  diastolic dysfunction.  Hospitalist was called for admission for diverticulitis and hypokalemia  Review of Systems:    Pertinent positives include: abdominal pain, nausea, constipation  Constitutional:  No weight loss, night sweats, Fevers, chills, fatigue, weight loss  HEENT:  No headaches, Difficulty swallowing,Tooth/dental problems,Sore throat,  No sneezing, itching, ear ache, nasal congestion, post nasal drip,  Cardio-vascular:  No chest pain, Orthopnea, PND, anasarca, dizziness, palpitations.no Bilateral lower extremity swelling  GI:  No heartburn, indigestion,  vomiting, diarrhea, change in bowel habits, loss of appetite, melena, blood in stool, hematemesis Resp:  no shortness of breath at rest. No dyspnea on exertion, No excess mucus, no productive cough, No non-productive cough, No coughing up of blood.No change in color of mucus.No wheezing. Skin:  no rash or lesions. No jaundice GU:  no dysuria, change in color of urine, no urgency or frequency. No straining to urinate.  No flank pain.  Musculoskeletal:  No joint pain or no joint swelling. No decreased range of motion. No back pain.  Psych:  No change in mood or affect. No depression or anxiety. No memory loss.  Neuro: no localizing neurological complaints, no tingling, no weakness, no double vision, no gait abnormality, no slurred speech, no confusion  Otherwise ROS are negative except for above, 10 systems were reviewed  Past Medical History: Past Medical History  Diagnosis Date  . Chronic back pain     "upper" (01/01/2014)  . Hypertension   . Depression   . Anxiety   . Anemia   . Menometrorrhagia   . Insomnia   . Diverticulitis   . Sickle cell trait ([place])   . Cardiomegaly     a. 2011 Echo:  EF 65-70%, Gr 1 DD, nl wall motion.  . Morbid obesity ([place])   . Tobacco abuse   . Palpitations     a. Date back to 08/2013.  . Diverticulitis     a. 09/2013  . Family history of adverse reaction to anesthesia     "Mom  gets so sick off of it"  . Heart murmur   . Chronic bronchitis ([place])     "get it ~ q yr" (01/01/2014)  . GERD (gastroesophageal reflux disease)   . Headache     "monthly" (01/01/2014)  . Migraine     "monthly" (01/01/2014)  . Arthritis     "qwhere"   Past Surgical History  Procedure Laterality Date  . Cholecystectomy  2009  . Tubal ligation  198  . Cesarean section  1983; 1986; 1988     Medications: Prior to Admission medications   Medication Sig Start Date End Date Taking? Authorizing Provider  apixaban (ELIQUIS) 5 MG TABS tablet Take 1 tablet (5 mg total) by mouth 2 (two) times daily. 01/26/15  Yes Lelon Perla, MD  Aspirin Effervescent (ALKA-SELTZER PO) Take 2 tablets by mouth 2 (two) times daily as needed (stomach pains).   Yes Historical Provider, MD  Chlorpheniramine Maleate (CHLOR-TRIMETON PO) Take 2 tablets by mouth daily as needed (allergies).   Yes Historical Provider, MD  diltiazem (CARDIZEM CD) 180 MG 24 hr capsule Take 1 capsule (180 mg total) by mouth daily. 01/26/15  Yes Lelon Perla, MD  hydrochlorothiazide (HYDRODIURIL) 25 MG tablet Take 1 tablet (25 mg total) by mouth daily. 01/26/15  Yes Lelon Perla, MD  ibuprofen (ADVIL,MOTRIN) 200 MG tablet Take 800 mg by mouth every 6 (six) hours as needed for moderate pain or cramping.   Yes Historical Provider, MD  metoprolol (LOPRESSOR) 50 MG tablet Take 1 tablet (50 mg total) by mouth 2 (two) times daily. 01/26/15  Yes Lelon Perla, MD  ketorolac (TORADOL) 10 MG tablet Take 1 tablet (10 mg total) by mouth every 6 (six) hours as needed. 04/18/14   Larene Pickett, PA-C    Allergies:   Allergies  Allergen Reactions  . Benadryl [Diphenhydramine Hcl] Hives and Itching  . Codeine Nausea And Vomiting  . Dilaudid [Hydromorphone Hcl]     "feels like she dying '  . Percocet [Oxycodone-Acetaminophen] Hives and Itching    Social History:  Ambulatory   independently   Lives at home  With family     reports  that she quit smoking about 13 months ago. Her smoking use included Cigarettes. She has a 9.9 pack-year smoking history. She does not have any smokeless tobacco history on file. She reports that she drinks about 1.8 oz of alcohol per week. She reports that she does not use illicit drugs.     Family History: family history includes Heart attack in her mother; Heart failure in her sister; Lupus in her sister; Other in her father; Pulmonary embolism in her mother.    Physical Exam: Patient Vitals for the past 24 hrs:  BP Temp Temp src Pulse Resp SpO2  02/20/15 2030 121/60 mmHg - - 108 20 99 %  02/20/15 1736 100/83 mmHg - - - - -  02/20/15 1733 (!) 77/61 mmHg 99 F (37.2 C) Oral 107 20 98 %    1. General:  in No Acute distress 2. Psychological: Alert and   Oriented 3. Head/ENT:     Dry Mucous Membranes  Head Non traumatic, neck supple                          Normal  Dentition 4. SKIN:  decreased Skin turgor,  Skin clean Dry and intact no rash 5. Heart: Regular rate and rhythm no Murmur, Rub or gallop 6. Lungs: Clear to auscultation bilaterally, no wheezes or crackles   7. Abdomen:  , generalized tenderness worse in the right lower quadrant patient is distractible able to continue speaking on the phone, Non distended 8. Lower extremities: no clubbing, cyanosis, or edema 9. Neurologically Grossly intact, moving all 4 extremities equally 10. MSK: Normal range of motion  body mass index is unknown because there is no weight on file.   Labs on Admission:   Results for orders placed or performed during the hospital encounter of 02/20/15 (from the past 24 hour(s))  Lipase, blood     Status: None   Collection Time: 02/20/15  7:50 PM  Result Value Ref Range   Lipase 19 11 - 51 U/L  Comprehensive metabolic panel     Status: Abnormal   Collection Time: 02/20/15  7:50 PM  Result Value Ref Range   Sodium 142 135 - 145 mmol/L   Potassium 2.9 (L) 3.5 - 5.1 mmol/L    Chloride 102 101 - 111 mmol/L   CO2 29 22 - 32 mmol/L   Glucose, Bld 135 (H) 65 - 99 mg/dL   BUN 7 6 - 20 mg/dL   Creatinine, Ser 0.85 0.44 - 1.00 mg/dL   Calcium 8.7 (L) 8.9 - 10.3 mg/dL   Total Protein 7.2 6.5 - 8.1 g/dL   Albumin 3.8 3.5 - 5.0 g/dL   AST 21 15 - 41 U/L   ALT 15 14 - 54 U/L   Alkaline Phosphatase 45 38 - 126 U/L   Total Bilirubin 1.0 0.3 - 1.2 mg/dL   GFR calc non Af Amer >60 >60 mL/min   GFR calc Af Amer >60 >60 mL/min   Anion gap 11 5 - 15  CBC     Status: Abnormal   Collection Time: 02/20/15  7:50 PM  Result Value Ref Range   WBC 10.4 4.0 - 10.5 K/uL   RBC 4.23 3.87 - 5.11 MIL/uL   Hemoglobin 11.7 (L) 12.0 - 15.0 g/dL   HCT 35.3 (L) 36.0 - 46.0 %   MCV 83.5 78.0 - 100.0 fL   MCH 27.7 26.0 - 34.0 pg   MCHC 33.1 30.0 - 36.0 g/dL   RDW 14.9 11.5 - 15.5 %   Platelets 328 150 - 400 K/uL    UA not obtained  Lab Results  Component Value Date   HGBA1C 5.6 01/01/2014    CrCl cannot be calculated (Unknown ideal weight.).  BNP (last 3 results) No results for input(s): PROBNP in the last 8760 hours.  Other results:  I have pearsonaly reviewed this: ECG REPORT  Not obtained There were no vitals filed for this visit.   Cultures:    Component Value Date/Time   SDES ABSCESS peri rectal abscess 09/11/2008 1112   SPECREQUEST unazyn IMMUNE:NORM 09/11/2008 1112   CULT  09/11/2008 1112    Multiple Species Consistent With Normal Fecal Flora Note: NO GROUP A STREP (S.PYOGENES) ISOLATED NO STAPHYLOCOCCUS AUREUS ISOLATED   REPTSTATUS 09/14/2008 FINAL 09/11/2008 1112     Radiological Exams on Admission: Ct Abdomen Pelvis W Contrast  02/20/2015  CLINICAL DATA:  Abdominal pain. Patient believes this is diverticulitis.  History of diverticulitis and hypertension as well as sickle cell trait. EXAM: CT ABDOMEN AND PELVIS WITH CONTRAST TECHNIQUE: Multidetector CT imaging of the abdomen and pelvis was performed using the standard protocol following bolus  administration of intravenous contrast. CONTRAST:  40mL OMNIPAQUE IOHEXOL 300 MG/ML SOLN, 161mL OMNIPAQUE IOHEXOL 300 MG/ML SOLN COMPARISON:  09/30/2014 FINDINGS: Lung bases: Dependent subsegmental atelectasis. No convincing pneumonia or edema. Heart top-normal in size. Several low-density liver lesions are noted, which do not enhance and are stable from the prior CT consistent with cysts. Largest lies in the posterior segment of the right lobe measuring 1 cm. Liver otherwise unremarkable. Gallbladder surgically absent.  No bile duct dilation. Spleen, pancreas, adrenal glands:  Unremarkable. Kidneys, ureters, bladder:  Normal. Uterus and adnexa:  Unremarkable. Lymph nodes:  No adenopathy. Ascites:  None. Gastrointestinal: Sigmoid colon is redundant. There multiple colonic diverticula. Significant inflammatory changes surround the mid to distal sigmoid colon, with the inflammatory changes centered on to portions of the sigmoid colon, both lying in the right lower quadrant. There is some extraluminal air, but no formed abscess. Other diverticula seen along the remainder of colon with no other inflammatory changes. Stomach and small bowel are unremarkable. Musculoskeletal:  Unremarkable. IMPRESSION: 1. Sigmoid diverticulitis. There appears to be 2 sections of the mid sigmoid colon there are inflamed, both projecting in the right lower quadrant. Areas associated extraluminal air reflecting diverticular perforation, but no evidence of an abscess. 2. No other acute findings. 3. Multiple other noninflamed colonic diverticula. 4. Stable small hepatic cysts. Electronically Signed   By: Lajean Manes M.D.   On: 02/20/2015 21:53    Chart has been reviewed  Family  at  Bedside   Assessment/Plan 50 year old female history of recurrent diverticulitis episodes admitted for diverticulitis with microperforation or abscess formation and hypokalemia. Has history of paroxysmal atrial fibrillation currently on  anticoagulation  Present on Admission:  . Diverticulitis large -  intestine surgeries aware will admit for IV antibiotics keep nothing by mouth hold anticoagulation  . PAF (paroxysmal atrial fibrillation) ([place]) for now we'll hold anticoagulation as patient may need operative intervention. We'll attempt to restart metoprolol once blood pressure stable with holding parameters  . Hypokalemia will replace and check magnesium level  . Chronic diastolic congestive heart failure, NYHA class 1 (HCC) chronic Serous to be stable given hypotensive episode in the ER will give IV fluids and monitor closely  . HYPERTENSION, BENIGN we'll hold blood pressure medications given episodes of hypotension rehydrate aggressively. . Tobacco abuse patient states she quit  Prophylaxis: Transition to Lovenox while hospitalized hold off on by mouth meds increasing central mention  CODE STATUS:  FULL CODE  as per patient   Disposition: To home once workup is complete and patient is stable  Other plan as per orders.  I have spent a total of 70  min on this admission extra time was taken to reassess the patient and di the negative deep tissue massage  Aviance [place]wood 02/20/2015, 11:42 PM  was  [facility]  Pager [phone]   after 2 AM please page floor coverage PA If 7AM-7PM, please contact the day team taking care of the patient  [email or web address]  Password [email or web address]

## 2015-02-20 NOTE — ED Provider Notes (Signed)
CSN: [id number]     Arrival date & time 02/20/15  1720 History   First MD Initiated Contact with Patient 02/20/15 1815     Chief Complaint  Patient presents with  . Diverticulitis     (Consider location/radiation/quality/duration/timing/severity/associated sxs/prior Treatment) HPI Patient presents with elbow pain for 2 days. States she's had no nausea, vomiting or fever. Denies loose stool. Patient has had constipation though had a small bowel movement this morning. She states the pain is diffuse. When asked if it is similar to her prior diverticulitis she states "it is and it isn't". No known sick contacts. Patient had prior cholecystectomy.  Past Medical History  Diagnosis Date  . Chronic back pain     "upper" (01/01/2014)  . Hypertension   . Depression   . Anxiety   . Anemia   . Menometrorrhagia   . Insomnia   . Diverticulitis   . Sickle cell trait ([place])   . Cardiomegaly     a. 2011 Echo: EF 65-70%, Gr 1 DD, nl wall motion.  . Morbid obesity ([place])   . Tobacco abuse   . Palpitations     a. Date back to 08/2013.  . Diverticulitis     a. 09/2013  . Family history of adverse reaction to anesthesia     "Mom gets so sick off of it"  . Heart murmur   . Chronic bronchitis ([place])     "get it ~ q yr" (01/01/2014)  . GERD (gastroesophageal reflux disease)   . Headache     "monthly" (01/01/2014)  . Migraine     "monthly" (01/01/2014)  . Arthritis     "qwhere"   Past Surgical History  Procedure Laterality Date  . Cholecystectomy  2009  . Tubal ligation  198  . Cesarean section  1983; 1986; 1988   Family History  Problem Relation Age of Onset  . Heart attack Mother     ? in her 74's - never sought treatment  . Pulmonary embolism Mother   . Other Father     ? hx  . Lupus Sister   . Heart failure Sister    Social History  Substance Use Topics  . Smoking status: Former Smoker -- 0.33 packs/day for 30 years    Types: Cigarettes    Quit date: 12/31/2013  . Smokeless  tobacco: None     Comment: pt smoke electronic cigarette  . Alcohol Use: 1.8 oz/week    0 Standard drinks or equivalent, 3 Glasses of wine per week   OB History    No data available     Review of Systems  Constitutional: Negative for fever and chills.  Respiratory: Negative for shortness of breath.   Cardiovascular: Negative for chest pain.  Gastrointestinal: Positive for abdominal pain and constipation. Negative for nausea, vomiting, diarrhea and blood in stool.  Genitourinary: Negative for dysuria, frequency and flank pain.  Musculoskeletal: Negative for back pain, neck pain and neck stiffness.  Skin: Negative for rash and wound.  Neurological: Negative for dizziness, weakness, light-headedness, numbness and headaches.  All other systems reviewed and are negative.     Allergies  Benadryl; Codeine; Dilaudid; and Percocet  Home Medications   Prior to Admission medications   Medication Sig Start Date End Date Taking? Authorizing Provider  apixaban (ELIQUIS) 5 MG TABS tablet Take 1 tablet (5 mg total) by mouth 2 (two) times daily. 01/26/15  Yes Lelon Perla, MD  Aspirin Effervescent (ALKA-SELTZER PO) Take 2 tablets by mouth 2 (  two) times daily as needed (stomach pains).   Yes Historical Provider, MD  Chlorpheniramine Maleate (CHLOR-TRIMETON PO) Take 2 tablets by mouth daily as needed (allergies).   Yes Historical Provider, MD  diltiazem (CARDIZEM CD) 180 MG 24 hr capsule Take 1 capsule (180 mg total) by mouth daily. 01/26/15  Yes Lelon Perla, MD  hydrochlorothiazide (HYDRODIURIL) 25 MG tablet Take 1 tablet (25 mg total) by mouth daily. 01/26/15  Yes Lelon Perla, MD  ibuprofen (ADVIL,MOTRIN) 200 MG tablet Take 800 mg by mouth every 6 (six) hours as needed for moderate pain or cramping.   Yes Historical Provider, MD  metoprolol (LOPRESSOR) 50 MG tablet Take 1 tablet (50 mg total) by mouth 2 (two) times daily. 01/26/15  Yes Lelon Perla, MD  ketorolac (TORADOL) 10 MG  tablet Take 1 tablet (10 mg total) by mouth every 6 (six) hours as needed. 04/18/14   Larene Pickett, PA-C   BP 121/60 mmHg  Pulse 108  Temp(Src) 99 F (37.2 C) (Oral)  Resp 20  SpO2 99% Physical Exam  Constitutional: She is oriented to person, place, and time. She appears well-developed and well-nourished. No distress.  HENT:  Head: Normocephalic and atraumatic.  Mouth/Throat: Oropharynx is clear and moist.  Eyes: EOM are normal. Pupils are equal, round, and reactive to light.  Neck: Normal range of motion. Neck supple.  Cardiovascular: Normal rate and regular rhythm.  Exam reveals no gallop and no friction rub.   No murmur heard. Pulmonary/Chest: Effort normal and breath sounds normal. No respiratory distress. She has no wheezes. She has no rales.  Abdominal: Soft. Bowel sounds are normal. She exhibits no distension and no mass. There is tenderness (patient has epigastric and right lower quadrant tenderness. Questionable rebound.). There is rebound. There is no guarding.  Musculoskeletal: Normal range of motion. She exhibits no edema or tenderness.  No costovertebral angle tenderness to percussion. No lower extremity swelling or pain.  Neurological: She is alert and oriented to person, place, and time.  Moves all extremities without deficit. Sensation is fully intact.  Skin: Skin is warm and dry. No rash noted. No erythema.  Psychiatric: She has a normal mood and affect. Her behavior is normal.  Nursing note and vitals reviewed.   ED Course  Procedures (including critical care time) Labs Review Labs Reviewed  COMPREHENSIVE METABOLIC PANEL - Abnormal; Notable for the following:    Potassium 2.9 (*)    Glucose, Bld 135 (*)    Calcium 8.7 (*)    All other components within normal limits  CBC - Abnormal; Notable for the following:    Hemoglobin 11.7 (*)    HCT 35.3 (*)    All other components within normal limits  LIPASE, BLOOD  URINALYSIS, ROUTINE W REFLEX MICROSCOPIC (NOT AT  [facility])  MAGNESIUM  LACTIC ACID, PLASMA    Imaging Review Ct Abdomen Pelvis W Contrast  02/20/2015  CLINICAL DATA:  Abdominal pain. Patient believes this is diverticulitis. History of diverticulitis and hypertension as well as sickle cell trait. EXAM: CT ABDOMEN AND PELVIS WITH CONTRAST TECHNIQUE: Multidetector CT imaging of the abdomen and pelvis was performed using the standard protocol following bolus administration of intravenous contrast. CONTRAST:  30mL OMNIPAQUE IOHEXOL 300 MG/ML SOLN, 152mL OMNIPAQUE IOHEXOL 300 MG/ML SOLN COMPARISON:  09/30/2014 FINDINGS: Lung bases: Dependent subsegmental atelectasis. No convincing pneumonia or edema. Heart top-normal in size. Several low-density liver lesions are noted, which do not enhance and are stable from the prior CT consistent with  cysts. Largest lies in the posterior segment of the right lobe measuring 1 cm. Liver otherwise unremarkable. Gallbladder surgically absent.  No bile duct dilation. Spleen, pancreas, adrenal glands:  Unremarkable. Kidneys, ureters, bladder:  Normal. Uterus and adnexa:  Unremarkable. Lymph nodes:  No adenopathy. Ascites:  None. Gastrointestinal: Sigmoid colon is redundant. There multiple colonic diverticula. Significant inflammatory changes surround the mid to distal sigmoid colon, with the inflammatory changes centered on to portions of the sigmoid colon, both lying in the right lower quadrant. There is some extraluminal air, but no formed abscess. Other diverticula seen along the remainder of colon with no other inflammatory changes. Stomach and small bowel are unremarkable. Musculoskeletal:  Unremarkable. IMPRESSION: 1. Sigmoid diverticulitis. There appears to be 2 sections of the mid sigmoid colon there are inflamed, both projecting in the right lower quadrant. Areas associated extraluminal air reflecting diverticular perforation, but no evidence of an abscess. 2. No other acute findings. 3. Multiple other noninflamed colonic  diverticula. 4. Stable small hepatic cysts. Electronically Signed   By: Lajean Manes M.D.   On: 02/20/2015 21:53   I have personally reviewed and evaluated these images and lab results as part of my medical decision-making.   EKG Interpretation None      MDM   Final diagnoses:  Hypokalemia  Diverticulitis of large intestine with perforation without bleeding     Patient with evidence of sigmoid diverticulitis on CT scan. Evidence of perforation without abscess. Discussed with Dr. Ernestina Patches of general surgery. Advises medicine admit and to consult as needed. Initiated IV antibiotics in the emergency department. Also initiated IV fluid. Initial blood pressure 77/61. Believe this is likely an error. Repeat blood pressure 3 minutes later has improved to normotensive. Discussed with Dr.Doutova. We'll see patient and admit. IV replacement of potassium initiated in the emergency department   Julianne Rice, MD 02/20/15 2243

## 2015-02-21 LAB — COMPREHENSIVE METABOLIC PANEL
ALBUMIN: 3.1 g/dL — AB (ref 3.5–5.0)
ALT: 10 U/L — ABNORMAL LOW (ref 14–54)
ANION GAP: 6 (ref 5–15)
AST: 11 U/L — ABNORMAL LOW (ref 15–41)
Alkaline Phosphatase: 38 U/L (ref 38–126)
BILIRUBIN TOTAL: 1 mg/dL (ref 0.3–1.2)
BUN: 6 mg/dL (ref 6–20)
CO2: 27 mmol/L (ref 22–32)
Calcium: 7.8 mg/dL — ABNORMAL LOW (ref 8.9–10.3)
Chloride: 106 mmol/L (ref 101–111)
Creatinine, Ser: 0.74 mg/dL (ref 0.44–1.00)
GFR calc Af Amer: >60 mL/min (ref >60)
GFR calc non Af Amer: >60 mL/min (ref >60)
GLUCOSE: 125 mg/dL — AB (ref 65–99)
POTASSIUM: 3 mmol/L — AB (ref 3.5–5.1)
SODIUM: 139 mmol/L (ref 135–145)
TOTAL PROTEIN: 6.1 g/dL — AB (ref 6.5–8.1)

## 2015-02-21 LAB — TSH: TSH: 0.479 u[IU]/mL (ref 0.350–4.500)

## 2015-02-21 LAB — CBC
HEMATOCRIT: 28.8 % — AB (ref 36.0–46.0)
HEMOGLOBIN: 9.5 g/dL — AB (ref 12.0–15.0)
MCH: 27.3 pg (ref 26.0–34.0)
MCHC: 33 g/dL (ref 30.0–36.0)
MCV: 82.8 fL (ref 78.0–100.0)
Platelets: 292 10*3/uL (ref 150–400)
RBC: 3.48 MIL/uL — ABNORMAL LOW (ref 3.87–5.11)
RDW: 15 % (ref 11.5–15.5)
WBC: 9.8 10*3/uL (ref 4.0–10.5)

## 2015-02-21 LAB — HEPARIN LEVEL (UNFRACTIONATED): HEPARIN UNFRACTIONATED: 0.15 [IU]/mL — AB (ref 0.30–0.70)

## 2015-02-21 LAB — APTT: APTT: 34 s (ref 24–37)

## 2015-02-21 LAB — LACTIC ACID, PLASMA
LACTIC ACID, VENOUS: 0.9 mmol/L (ref 0.5–2.0)
Lactic Acid, Venous: 0.7 mmol/L (ref 0.5–2.0)

## 2015-02-21 LAB — MAGNESIUM: MAGNESIUM: 1.7 mg/dL (ref 1.7–2.4)

## 2015-02-21 LAB — PHOSPHORUS: PHOSPHORUS: 2.7 mg/dL (ref 2.5–4.6)

## 2015-02-21 MED ORDER — HEPARIN BOLUS VIA INFUSION
2000.0000 [IU] | Freq: Once | INTRAVENOUS | Status: AC
  Filled 2015-02-21: qty 2000

## 2015-02-21 MED ORDER — METOPROLOL TARTRATE 50 MG PO TABS
50.0000 mg | ORAL_TABLET | Freq: Two times a day (BID) | ORAL | Status: DC
  Administered 2015-02-21 – 2015-03-05 (×25): 50 mg via ORAL
  Filled 2015-02-21 (×28): qty 1

## 2015-02-21 MED ORDER — SIMETHICONE 80 MG PO CHEW: 80.0000 mg | CHEWABLE_TABLET | Freq: Four times a day (QID) | ORAL | Status: DC | PRN

## 2015-02-21 MED ORDER — POTASSIUM CHLORIDE CRYS ER 20 MEQ PO TBCR
40.0000 meq | EXTENDED_RELEASE_TABLET | ORAL | Status: AC
  Administered 2015-02-21 (×2): 40 meq via ORAL
  Filled 2015-02-21 (×2): qty 2

## 2015-02-21 MED ORDER — SIMETHICONE 80 MG PO CHEW
80.0000 mg | CHEWABLE_TABLET | Freq: Four times a day (QID) | ORAL | Status: DC | PRN
  Administered 2015-02-21 – 2015-02-22 (×2): 80 mg via ORAL
  Filled 2015-02-21 (×4): qty 1

## 2015-02-21 MED ORDER — DILTIAZEM HCL ER COATED BEADS 180 MG PO CP24
180.0000 mg | ORAL_CAPSULE | Freq: Every day | ORAL | Status: DC
  Administered 2015-02-21 – 2015-03-05 (×13): 180 mg via ORAL
  Filled 2015-02-21 (×14): qty 1

## 2015-02-21 MED ORDER — SODIUM CHLORIDE 0.9 % IV SOLN
INTRAVENOUS | Status: DC
  Administered 2015-02-21: 19:00:00 via INTRAVENOUS

## 2015-02-21 MED ORDER — HEPARIN (PORCINE) IN NACL 100-0.45 UNIT/ML-% IJ SOLN
1750.0000 [IU]/h | INTRAMUSCULAR | Status: DC
  Administered 2015-02-21: 1200 [IU]/h via INTRAVENOUS
  Administered 2015-02-21: 2000 [IU]/h via INTRAVENOUS
  Administered 2015-02-23 – 2015-02-24 (×3): 1600 [IU]/h via INTRAVENOUS
  Filled 2015-02-21 (×8): qty 250

## 2015-02-21 MED ORDER — SODIUM CHLORIDE 0.9 % IV BOLUS (SEPSIS)
500.0000 mL | Freq: Once | INTRAVENOUS | Status: AC
  Administered 2015-02-21: 500 mL via INTRAVENOUS

## 2015-02-21 NOTE — Progress Notes (Addendum)
PROGRESS NOTE    Crystal Brennan [id number] DOB: Aug 12, 1965 DOA: 02/20/2015 PCP: No PCP Per Patient  Primary Cardiologist: Dr. Kirk Ruths Primary Pulmonologist: Dr. Kara Mead  HPI/Brief narrative 50 year old female patient with history of A. fib on Eliquis, HTN, OSA on nightly CPAP and recurrent diverticulitis (3 prior episodes) presented to the [facility] ED on 02/20/15 with complaints of 2 day history of abdominal pain similar to prior episodes of diverticulitis. No nausea or vomiting reported. Possible constipation-last p.m. 02/20/15. CT abdomen in ED confirmed sigmoid diverticulitis and microperforation. Treating conservatively. Surgeons following.   Assessment/Plan:  Recurrent diverticulitis with microperforation - Treating conservatively with bowel rest (NPO except meds), IV fluids, IV antibiotics (Cipro and sent Flagyl) and pain management. - Gen. surgery consultation appreciated: Prefer conservative management and elective colon resection rather than colon resection this admission if she does not improve or worsens which will certainly require a colostomy. Discussed with Dr. Lucia Gaskins. If not adequately improving, may consider broadening antibiotic spectrum in a.m. - If surgery planned, will need Cardiology preop clearance.  Hypokalemia - Replace aggressively and follow. Magnesium normal.  Anemia - Baseline hemoglobin probably in the 11 g per DL range. Hemoglobin 9.5-may be dilutional. Follow CBC in a.m.  Chronic diastolic CHF - Compensated or maybe even a little little dry. Monitor closely while being hydrated.   Proximal A. Fib - Anticoagulated on Eliquis at home- last dose 02/19/2015. Currently held in case urgent surgery is required. Place on IV Heparin infusion which can be discontinued if urgent surgery required - CHA2DS2-VASc score: at least 3. - Resume home dose of Cardizem & Metoprolol. Hold HCTZ. Continue monitoring on telemetry.  Essential hypertension -  Controlled. Transient hypotension in ED which has resolved.   Morbid obesity/Body mass index is 46.81 kg/(m^2).  OSA - Continue nightly CPAP  Tobacco abuse - States that she quit in November 2016 and denies alcohol use.  History of anxiety and depression   DVT prophylaxis: Lovenox Code Status: Full Family Communication: Discussed with patient's spouse 1/8 Disposition Plan: DC home when medically stable him a possibly in 4-5 days   Consultants:  General surgery  Procedures:  None  Antibiotics:  IV Cipro 1/7 >  IV Flagyl 1/7 >   Subjective: Abdominal pain is better-indicates epigastric/RUQ and lower quadrants, rated at 7/10 in severity, worse with movement. No nausea or vomiting. Last BM 02/19/15. No chest pain or dyspnea reported.  Objective: Filed Vitals:   02/20/15 2325 02/20/15 2359 02/21/15 0419 02/21/15 1250  BP: 100/60 106/57 119/66 124/64  Pulse:  106 106 90  Temp:  99.8 F (37.7 C) 98.9 F (37.2 C)   TempSrc:  Oral Oral   Resp:  20 20   Height:  5\' 2"  (1.575 m)    Weight:  116.121 kg (256 lb)    SpO2:  98% 99% 97%    Intake/Output Summary (Last 24 hours) at 02/21/15 1503 Last data filed at 02/21/15 1033  Gross per 24 hour  Intake      0 ml  Output      1 ml  Net     -1 ml   Filed Weights   02/20/15 2359  Weight: 116.121 kg (256 lb)     Exam:  General exam: Moderately built and morbidly obese female lying comfortably propped up in bed. Respiratory system: Clear. No increased work of breathing. Cardiovascular system: S1 & S2 heard, RRR. No JVD, murmurs, gallops, clicks or pedal edema. Telemetry: Sinus tachycardia in  the low 100s. Gastrointestinal system: Abdomen is  obese/nondistended, soft. Tenderness in epigastric/RUQ and lower quadrants with some warrant regarding but no rigidity or rebound. Normal bowel sounds heard. Central nervous system: Alert and oriented. No focal neurological deficits. Extremities: Symmetric 5 x 5 power.   Data  Reviewed: Basic Metabolic Panel:  Recent Labs Lab 02/20/15 1950 02/21/15 0547  NA 142 139  K 2.9* 3.0*  CL 102 106  CO2 29 27  GLUCOSE 135* 125*  BUN 7 6  CREATININE 0.85 0.74  CALCIUM 8.7* 7.8*  MG 1.7 1.7  PHOS  --  2.7   Liver Function Tests:  Recent Labs Lab 02/20/15 1950 02/21/15 0547  AST 21 11*  ALT 15 10*  ALKPHOS 45 38  BILITOT 1.0 1.0  PROT 7.2 6.1*  ALBUMIN 3.8 3.1*    Recent Labs Lab 02/20/15 1950  LIPASE 19   No results for input(s): AMMONIA in the last 168 hours. CBC:  Recent Labs Lab 02/20/15 1950 02/21/15 0547  WBC 10.4 9.8  HGB 11.7* 9.5*  HCT 35.3* 28.8*  MCV 83.5 82.8  PLT 328 292   Cardiac Enzymes: No results for input(s): CKTOTAL, CKMB, CKMBINDEX, TROPONINI in the last 168 hours. BNP (last 3 results) No results for input(s): PROBNP in the last 8760 hours. CBG: No results for input(s): GLUCAP in the last 168 hours.  No results found for this or any previous visit (from the past 240 hour(s)).       Studies: Ct Abdomen Pelvis W Contrast  02/20/2015  CLINICAL DATA:  Abdominal pain. Patient believes this is diverticulitis. History of diverticulitis and hypertension as well as sickle cell trait. EXAM: CT ABDOMEN AND PELVIS WITH CONTRAST TECHNIQUE: Multidetector CT imaging of the abdomen and pelvis was performed using the standard protocol following bolus administration of intravenous contrast. CONTRAST:  53mL OMNIPAQUE IOHEXOL 300 MG/ML SOLN, 156mL OMNIPAQUE IOHEXOL 300 MG/ML SOLN COMPARISON:  09/30/2014 FINDINGS: Lung bases: Dependent subsegmental atelectasis. No convincing pneumonia or edema. Heart top-normal in size. Several low-density liver lesions are noted, which do not enhance and are stable from the prior CT consistent with cysts. Largest lies in the posterior segment of the right lobe measuring 1 cm. Liver otherwise unremarkable. Gallbladder surgically absent.  No bile duct dilation. Spleen, pancreas, adrenal glands:   Unremarkable. Kidneys, ureters, bladder:  Normal. Uterus and adnexa:  Unremarkable. Lymph nodes:  No adenopathy. Ascites:  None. Gastrointestinal: Sigmoid colon is redundant. There multiple colonic diverticula. Significant inflammatory changes surround the mid to distal sigmoid colon, with the inflammatory changes centered on to portions of the sigmoid colon, both lying in the right lower quadrant. There is some extraluminal air, but no formed abscess. Other diverticula seen along the remainder of colon with no other inflammatory changes. Stomach and small bowel are unremarkable. Musculoskeletal:  Unremarkable. IMPRESSION: 1. Sigmoid diverticulitis. There appears to be 2 sections of the mid sigmoid colon there are inflamed, both projecting in the right lower quadrant. Areas associated extraluminal air reflecting diverticular perforation, but no evidence of an abscess. 2. No other acute findings. 3. Multiple other noninflamed colonic diverticula. 4. Stable small hepatic cysts. Electronically Signed   By: Lajean Manes M.D.   On: 02/20/2015 21:53        Scheduled Meds: . ciprofloxacin  400 mg Intravenous Once  . ciprofloxacin  400 mg Intravenous Q12H  . metoprolol  2.5 mg Intravenous 4 times per day  . metronidazole  500 mg Intravenous Q8H  . sodium chloride  3  mL Intravenous Q12H   Continuous Infusions: . sodium chloride 100 mL/hr at 02/21/15 [id number]    Principal Problem:   Diverticulitis of large intestine with perforation without bleeding Active Problems:   Morbid obesity ([facility])   Tobacco abuse   Hypertension   PAF (paroxysmal atrial fibrillation) ([facility])   OSA (obstructive sleep apnea)   Hypokalemia   Chronic diastolic congestive heart failure, NYHA class 1 ([place])    Time spent: 40 minutes.    Vernell Leep, MD, FACP, FHM. [facility] Pager [phone]  If 7PM-7AM, please contact night-coverage [email or web address] Password [email or web address] 02/21/2015, 3:03 PM    LOS: 1 day

## 2015-02-21 NOTE — Progress Notes (Signed)
ANTICOAGULATION CONSULT NOTE - Initial Consult  Pharmacy Consult for Heparin Indication: bridge therapy for Afib while off Apixaban  Allergies  Allergen Reactions  . Benadryl [Diphenhydramine Hcl] Hives and Itching  . Codeine Nausea And Vomiting  . Dilaudid [Hydromorphone Hcl]     "feels like she dying '  . Percocet [Oxycodone-Acetaminophen] Hives and Itching    Patient Measurements: Height: 5\' 2"  (157.5 cm) Weight: 256 lb (116.121 kg) IBW/kg (Calculated) : 50.1 Heparin Dosing Weight: 79 kg  Vital Signs: Temp: 98.9 F (37.2 C) (01/08 0419) Temp Source: Oral (01/08 0419) BP: 124/64 mmHg (01/08 1250) Pulse Rate: 90 (01/08 1250)  Labs:  Recent Labs  02/20/15 1950 02/21/15 0547  HGB 11.7* 9.5*  HCT 35.3* 28.8*  PLT 328 292  CREATININE 0.85 0.74    Estimated Creatinine Clearance: 100.5 mL/min (by C-G formula based on Cr of 0.74).   Medical History: Past Medical History  Diagnosis Date  . Chronic back pain     "upper" (01/01/2014)  . Hypertension   . Depression   . Anxiety   . Anemia   . Menometrorrhagia   . Insomnia   . Diverticulitis   . Sickle cell trait ([place])   . Cardiomegaly     a. 2011 Echo: EF 65-70%, Gr 1 DD, nl wall motion.  . Morbid obesity ([place])   . Tobacco abuse   . Palpitations     a. Date back to 08/2013.  . Diverticulitis     a. 09/2013  . Family history of adverse reaction to anesthesia     "Mom gets so sick off of it"  . Heart murmur   . Chronic bronchitis ([place])     "get it ~ q yr" (01/01/2014)  . GERD (gastroesophageal reflux disease)   . Headache     "monthly" (01/01/2014)  . Migraine     "monthly" (01/01/2014)  . Arthritis     "qwhere"    Medications:  Scheduled:  . ciprofloxacin  400 mg Intravenous Once  . ciprofloxacin  400 mg Intravenous Q12H  . diltiazem  180 mg Oral Daily  . metoprolol  50 mg Oral BID  . metronidazole  500 mg Intravenous Q8H  . sodium chloride  3 mL Intravenous Q12H   Infusions:  . sodium chloride  100 mL/hr at 02/21/15 0759   PRN: acetaminophen **OR** acetaminophen, morphine injection, ondansetron **OR** ondansetron (ZOFRAN) IV  Assessment: 49yoF, on Eliquis 5mg  BID with last dose on 1/6 @2100  for Afib, presented to the ER on 1/7 with 2 day history of abdominal pain similar to prior episodes of diverticulitis. CT abdomen confirmed sigmoid diverticulitis and microperforation but plan is to treat conservatively with bowel rest, IVF and IV Cipro/Flagyl. Heparin IV to start as bridge therapy while off Apixaban which can be d/ced if urgent surgery is required. Since last dose of Apixaban was <72 hours, will check baseline aPTT and HL. Will check aPTT and HL until correlation between these labs are observed.  Goal of Therapy:  aPTT 66-102 seconds (corresponds to HL 0.3-0.7) Monitor platelets by anticoagulation protocol: Yes   Plan:  Give 2000 units bolus x 1  Then heparin drip at 1200 units/hr. Check 6hr aPTT and HL then daily thereafter. Check daily CBC.  Garnet Sierras 02/21/2015,3:38 PM

## 2015-02-21 NOTE — Consult Note (Signed)
Re:   Crystal Brennan DOB:   March 11, 1965 MRN:   [id number]   [facility] consultation  ASSESSMENT AND PLAN: 1.  Diverticulitis, recurrent  Focal perforation on CT scan with small amount of local extraluminal air  Agree with current plan of NPO, IV antibiotics, and repeat labs.  If she can be cooled off - we can see her in the office, set up an outpatient colonoscopy and discuss elective colon resection.  If she does not improve, she may need colon resection on this admission, but with that she would almost certainly get a colostomy.  Will follow.  2.  History of A. Fib  Followed by Dr. Thresa Ross.  She is not in A fib at this time 3.  Anticoagulated on Eliquis, last dose evening of 02/18/2014  4.  HTN 5.  Morbid obesity - BMI 46.8 6.  Quit smoking in November 2016. 7.  OSA - seen by Dr. Elsworth Soho in 05/21/2014 8.  History of depression/anxiety  Chief Complaint  Patient presents with  . Diverticulitis   REFERRING PHYSICIAN: No PCP Per Patient  HISTORY OF PRESENT ILLNESS: Crystal Brennan is a 50 y.o. (DOB: [birth year])  AA obese  female whose primary care physician is No PCP Per Patient. She just got insurance on 02/14/2015 - so she does not have a PCP. She has been followed by Dr. Stanford Breed for cards.   She developed abdominal pain over the last 2 days.  She also has chronic back pain, but is not seeing anyone for this.   This is her 4th diverticular attack in about 2 years.  She was hospitalized one other time and treated as an outpatient 2 other times.  Seen in ER on 09/30/2014 for "mild" diverticulitis. She has not had a colonoscopy.  She had a EGD by Dr Collene Mares on 12/2007 for GERD and abdominal pain.    She had a cholecystectomy by Dr. Keturah Barre. Crystal Brennan (? Date).   Her WBC was 10,400 on admission, is now 9,800 (02/21/2015) CT scan of abdomen - 02/19/2014 - Sigmoid diverticulitis. There appears to be 2 sections of the mid sigmoid colon there are inflamed, both projecting in the right lower quadrant. Areas  associated extraluminal air reflecting diverticular perforation, but no evidence of an abscess.   Past Medical History  Diagnosis Date  . Chronic back pain     "upper" (01/01/2014)  . Hypertension   . Depression   . Anxiety   . Anemia   . Menometrorrhagia   . Insomnia   . Diverticulitis   . Sickle cell trait ([place])   . Cardiomegaly     a. 2011 Echo: EF 65-70%, Gr 1 DD, nl wall motion.  . Morbid obesity ([place])   . Tobacco abuse   . Palpitations     a. Date back to 08/2013.  . Diverticulitis     a. 09/2013  . Family history of adverse reaction to anesthesia     "Mom gets so sick off of it"  . Heart murmur   . Chronic bronchitis ([place])     "get it ~ q yr" (01/01/2014)  . GERD (gastroesophageal reflux disease)   . Headache     "monthly" (01/01/2014)  . Migraine     "monthly" (01/01/2014)  . Arthritis     "qwhere"      Past Surgical History  Procedure Laterality Date  . Cholecystectomy  2009  . Tubal ligation  198  . Cesarean section  [id number]; 1986; 1988  Current Facility-Administered Medications  Medication Dose Route Frequency Provider Last Rate Last Dose  . 0.9 %  sodium chloride infusion   Intravenous Continuous Toy Baker, MD 125 mL/hr at 02/21/15 0630    . acetaminophen (TYLENOL) tablet 650 mg  650 mg Oral Q6H PRN Toy Baker, MD       Or  . acetaminophen (TYLENOL) suppository 650 mg  650 mg Rectal Q6H PRN Toy Baker, MD      . ciprofloxacin (CIPRO) IVPB 400 mg  400 mg Intravenous Once Julianne Rice, MD 200 mL/hr at 02/20/15 2350 400 mg at 02/20/15 2350  . ciprofloxacin (CIPRO) IVPB 400 mg  400 mg Intravenous Q12H Toy Baker, MD      . metoprolol (LOPRESSOR) injection 2.5 mg  2.5 mg Intravenous 4 times per day Toy Baker, MD   Stopped at 02/21/15 0000  . metroNIDAZOLE (FLAGYL) IVPB 500 mg  500 mg Intravenous Q8H Toy Baker, MD   500 mg at 02/21/15 0550  . morphine 2 MG/ML injection 2-4 mg  2-4 mg Intravenous Q4H PRN  Toy Baker, MD   2 mg at 02/21/15 0554  . ondansetron (ZOFRAN) tablet 4 mg  4 mg Oral Q6H PRN Toy Baker, MD       Or  . ondansetron (ZOFRAN) injection 4 mg  4 mg Intravenous Q6H PRN Toy Baker, MD   4 mg at 02/21/15 0010  . sodium chloride 0.9 % injection 3 mL  3 mL Intravenous Q12H Toy Baker, MD   3 mL at 02/21/15 0007      Allergies  Allergen Reactions  . Benadryl [Diphenhydramine Hcl] Hives and Itching  . Codeine Nausea And Vomiting  . Dilaudid [Hydromorphone Hcl]     "feels like she dying '  . Percocet [Oxycodone-Acetaminophen] Hives and Itching    REVIEW OF SYSTEMS: Skin:  No history of rash.  No history of abnormal moles. Infection:  No history of hepatitis or HIV.  No history of MRSA. Neurologic:  No history of stroke.  No history of seizure.  No history of headaches. Cardiac:  History of HTN and A Fib - seen by Dr. Thresa Ross on 01/25/2015 Pulmonary:  OSA - seen by Dr. Elsworth Soho in 05/21/2014  Endocrine:  No diabetes. No thyroid disease. Gastrointestinal:  See HPI Urologic:  No history of kidney stones.  No history of bladder infections. GYN:  Has seen Dr. Idolina Primer for 4 cm left ovarian cyst Musculoskeletal:  Chronic back pain Hematologic:  Anticoagulated on Eliquis Psycho-social:  The patient is oriented.   The patient has no obvious psychologic or social impairment to understanding our conversation and plan.  SOCIAL and FAMILY HISTORY: Just remarried in Nov 2016 - Irvin. She has 3 children:  33, 30 and 28. She works for [employer]rap[employer], art for [employer]: BP 119/66 mmHg  Pulse 106  Temp(Src) 98.9 F (37.2 C) (Oral)  Resp 20  Ht 5\' 2"  (1.575 m)  Wt 116.121 kg (256 lb)  BMI 46.81 kg/m2  SpO2 99%  General: Obese AA F who is alert HEENT: Normal. Pupils equal. Neck: Supple. No mass.  No thyroid mass. Lymph Nodes:  No supraclavicular or cervical nodes.  Lungs: Clear to auscultation and symmetric breath sounds. Heart:  RRR.  No murmur or rub.  Abdomen: Soft. Some bowel sounds.  Tender right lower and right upper abdomen.  Some guarding, no rebound. Rectal: Not done. Extremities:  Good strength and ROM  in upper and lower extremities. Neurologic:  Grossly  intact to motor and sensory function. Psychiatric: Has normal mood and affect. Behavior is normal.   DATA REVIEWED: Epic notes  Alphonsa Overall, MD,  [facility] [address] [place]  [place], [place]    [place] Phone:  [place]:  [phone]

## 2015-02-22 ENCOUNTER — Inpatient Hospital Stay (HOSPITAL_COMMUNITY): Payer: Commercial Managed Care - PPO

## 2015-02-22 LAB — BASIC METABOLIC PANEL
Anion gap: 9 (ref 5–15)
BUN: 6 mg/dL (ref 6–20)
CO2: 22 mmol/L (ref 22–32)
Calcium: 7.9 mg/dL — ABNORMAL LOW (ref 8.9–10.3)
Chloride: 107 mmol/L (ref 101–111)
Creatinine, Ser: 0.66 mg/dL (ref 0.44–1.00)
GFR calc Af Amer: >60 mL/min (ref >60)
GLUCOSE: 103 mg/dL — AB (ref 65–99)
POTASSIUM: 3.4 mmol/L — AB (ref 3.5–5.1)
Sodium: 138 mmol/L (ref 135–145)

## 2015-02-22 LAB — CBC
HEMATOCRIT: 30 % — AB (ref 36.0–46.0)
Hemoglobin: 9.7 g/dL — ABNORMAL LOW (ref 12.0–15.0)
MCH: 27 pg (ref 26.0–34.0)
MCHC: 32.3 g/dL (ref 30.0–36.0)
MCV: 83.6 fL (ref 78.0–100.0)
Platelets: 330 10*3/uL (ref 150–400)
RBC: 3.59 MIL/uL — ABNORMAL LOW (ref 3.87–5.11)
RDW: 14.9 % (ref 11.5–15.5)
WBC: 9.6 10*3/uL (ref 4.0–10.5)

## 2015-02-22 LAB — APTT: aPTT: 55 seconds — ABNORMAL HIGH (ref 24–37)

## 2015-02-22 LAB — URINALYSIS, ROUTINE W REFLEX MICROSCOPIC
Bilirubin Urine: NEGATIVE
Glucose, UA: NEGATIVE mg/dL
Ketones, ur: >80 mg/dL — AB
Nitrite: NEGATIVE
PROTEIN: NEGATIVE mg/dL
Specific Gravity, Urine: 1.015 (ref 1.005–1.030)
pH: 6.5 (ref 5.0–8.0)

## 2015-02-22 LAB — URINE MICROSCOPIC-ADD ON

## 2015-02-22 LAB — HEPARIN LEVEL (UNFRACTIONATED)
HEPARIN UNFRACTIONATED: 0.25 [IU]/mL — AB (ref 0.30–0.70)
Heparin Unfractionated: 0.45 IU/mL (ref 0.30–0.70)
Heparin Unfractionated: 0.35 IU/mL (ref 0.30–0.70)

## 2015-02-22 MED ORDER — PHENOL 1.4 % MT LIQD
1.0000 | OROMUCOSAL | Status: DC | PRN
  Administered 2015-02-22: 1 via OROMUCOSAL
  Filled 2015-02-22: qty 177

## 2015-02-22 MED ORDER — SALINE SPRAY 0.65 % NA SOLN
1.0000 | NASAL | Status: DC | PRN
  Administered 2015-02-22: 1 via NASAL
  Filled 2015-02-22: qty 44

## 2015-02-22 MED ORDER — PIPERACILLIN-TAZOBACTAM 3.375 G IVPB
3.3750 g | Freq: Three times a day (TID) | INTRAVENOUS | Status: DC
  Administered 2015-02-22 – 2015-02-25 (×10): 3.375 g via INTRAVENOUS
  Filled 2015-02-22 (×11): qty 50

## 2015-02-22 MED ORDER — CIPROFLOXACIN HCL 500 MG PO TABS
500.0000 mg | ORAL_TABLET | Freq: Two times a day (BID) | ORAL | Status: DC
  Administered 2015-02-22: 500 mg via ORAL
  Filled 2015-02-22 (×3): qty 1

## 2015-02-22 MED ORDER — MORPHINE SULFATE (PF) 4 MG/ML IV SOLN
4.0000 mg | INTRAVENOUS | Status: DC | PRN
  Administered 2015-02-22 – 2015-02-25 (×22): 4 mg via INTRAVENOUS
  Filled 2015-02-22 (×22): qty 1

## 2015-02-22 MED ORDER — LIP MEDEX EX OINT
TOPICAL_OINTMENT | CUTANEOUS | Status: AC
  Administered 2015-02-22: 11:00:00
  Filled 2015-02-22: qty 7

## 2015-02-22 NOTE — Progress Notes (Signed)
ANTICOAGULATION CONSULT NOTE - Follow Up Consult  Pharmacy Consult for Heparin Indication: bridge therapy for Afib while off Apixaban  Allergies  Allergen Reactions  . Benadryl [Diphenhydramine Hcl] Hives and Itching  . Codeine Nausea And Vomiting  . Dilaudid [Hydromorphone Hcl]     "feels like she dying '  . Percocet [Oxycodone-Acetaminophen] Hives and Itching    Patient Measurements: Height: 5\' 2"  (157.5 cm) Weight: 256 lb (116.121 kg) IBW/kg (Calculated) : 50.1 HEPARIN DW (KG): 78.7  Vital Signs: Temp: 98.4 F (36.9 C) (01/09 0536) Temp Source: Oral (01/09 0536) BP: 122/54 mmHg (01/09 0536) Pulse Rate: 100 (01/09 0536)  Labs:  Recent Labs  02/20/15 1950 02/21/15 0547 02/21/15 1600 02/21/15 1611 02/21/15 2322 02/22/15 0725  HGB 11.7* 9.5*  --   --   --  9.7*  HCT 35.3* 28.8*  --   --   --  30.0*  PLT 328 292  --   --   --  330  APTT  --   --  34  --  55*  --   HEPARINUNFRC  --   --   --  <0.10* 0.15* 0.25*  CREATININE 0.85 0.74  --   --   --  0.66    Estimated Creatinine Clearance: 100.5 mL/min (by C-G formula based on Cr of 0.66).   Medications:  Infusions:  . heparin 1,450 Units/hr (02/22/15 0010)  PTA: apixaban 5mg  BID LD 1/6@ 2100  Assessment: 50 yo F admitted with sigmoid diverticulitis and microperforation currently being treated conservatively with bowel rest and IV antibiotics.  She is on chronic Apixaban for hx Afib which was held on admission.  Pharmacy asked to manage heparin bridge while off apixaban. 02/22/2015:  Repeat heparin level sub-therapeutic (rate verified, no interruptions with IV site in which IV heparin infusing)   CBC stable.  No bleeding noted.   Goal of Therapy:  Heparin level 0.3-0.7 units/ml Monitor platelets by anticoagulation protocol: Yes   Plan:  Increase heparin to 1600 units/hr Recheck 8hr heparin level Daily heparin level, CBC  Macyn Shropshire, [place] Drafts 02/22/2015,9:23 AM

## 2015-02-22 NOTE — Progress Notes (Signed)
Progress Note   STANNA AMRHEIN [id number] DOB: [birth year] DOA: 02/20/2015 PCP: No PCP Per Patient   Brief Narrative:   50 year old female patient with history of A. fib on Eliquis, HTN, OSA on nightly CPAP and recurrent diverticulitis (3 prior episodes) presented to the [facility] ED on 02/20/15 with complaints of 2 day history of abdominal pain similar to prior episodes of diverticulitis. No nausea or vomiting reported. Possible constipation-last p.m. 02/20/15. CT abdomen in ED confirmed sigmoid diverticulitis and microperforation. Treating conservatively. Surgeons following.  Assessment/Plan:   Recurrent diverticulitis with microperforation - Treating conservatively with bowel rest (NPO except meds), IV fluids, IV antibiotics (Cipro and sent Flagyl) and pain management. - Elective colon resection preferred over colon resection now, which will certainly require a colostomy.  - Discussed with Dr. Lucia Gaskins. If not adequately improving, may consider broadening antibiotic spectrum in a.m. - If surgery planned, will need Cardiology preop clearance.  Hypokalemia - Replaced aggressively. Magnesium normal. Recheck levels in the morning.  Anemia - Baseline hemoglobin probably in the 11 g per DL range. Hemoglobin 9.5-may be dilutional. Stable over the past 24 hours  Chronic diastolic CHF - Compensated or maybe even a little little dry. Monitor closely while being hydrated.   Proximal A. Fib - Anticoagulated on Eliquis at home- last dose 02/19/2015. Currently held in case urgent surgery is required.  - Continue IV heparin for now.  - CHA2DS2-VASc score: at least 3. - Continue home dose of Cardizem & Metoprolol. Hold HCTZ. Continue monitoring on telemetry.  Essential hypertension - Controlled. Transient hypotension in ED which has resolved.   Morbid obesity/Body mass index is 46.81 kg/(m^2)/OSA - Continue nightly CPAP.  Tobacco abuse - States that she quit in November 2016 and  denies alcohol use.  History of anxiety and depression   DVT Prophylaxis - Currently on a heparin drip.   Family Communication/Anticipated D/C date and plan/Code Status   Family Communication: Daughters at the bedside. Disposition Plan: Home when diverticulitis appears to be resolving. Anticipated D/C date:   3 days. Code Status: Full code.   IV Access:    Peripheral IV   Procedures and diagnostic studies:   Ct Abdomen Pelvis W Contrast  02/20/2015  CLINICAL DATA:  Abdominal pain. Patient believes this is diverticulitis. History of diverticulitis and hypertension as well as sickle cell trait. EXAM: CT ABDOMEN AND PELVIS WITH CONTRAST TECHNIQUE: Multidetector CT imaging of the abdomen and pelvis was performed using the standard protocol following bolus administration of intravenous contrast. CONTRAST:  38mL OMNIPAQUE IOHEXOL 300 MG/ML SOLN, 185mL OMNIPAQUE IOHEXOL 300 MG/ML SOLN COMPARISON:  09/30/2014 FINDINGS: Lung bases: Dependent subsegmental atelectasis. No convincing pneumonia or edema. Heart top-normal in size. Several low-density liver lesions are noted, which do not enhance and are stable from the prior CT consistent with cysts. Largest lies in the posterior segment of the right lobe measuring 1 cm. Liver otherwise unremarkable. Gallbladder surgically absent.  No bile duct dilation. Spleen, pancreas, adrenal glands:  Unremarkable. Kidneys, ureters, bladder:  Normal. Uterus and adnexa:  Unremarkable. Lymph nodes:  No adenopathy. Ascites:  None. Gastrointestinal: Sigmoid colon is redundant. There multiple colonic diverticula. Significant inflammatory changes surround the mid to distal sigmoid colon, with the inflammatory changes centered on to portions of the sigmoid colon, both lying in the right lower quadrant. There is some extraluminal air, but no formed abscess. Other diverticula seen along the remainder of colon with no other inflammatory changes. Stomach and small bowel are  unremarkable.  Musculoskeletal:  Unremarkable. IMPRESSION: 1. Sigmoid diverticulitis. There appears to be 2 sections of the mid sigmoid colon there are inflamed, both projecting in the right lower quadrant. Areas associated extraluminal air reflecting diverticular perforation, but no evidence of an abscess. 2. No other acute findings. 3. Multiple other noninflamed colonic diverticula. 4. Stable small hepatic cysts. Electronically Signed   By: Lajean Manes M.D.   On: 02/20/2015 21:53     Medical Consultants:    Surgery  Anti-Infectives:    Cipro 02/20/15  >  Flagyl 02/20/15  >  Subjective:   Crystal Brennan is having 7-8/10 LLQ and upper mid epigastric sharp, stabbing abdominal pain associated with some nausea.  No BMs.  No vomiting.  Objective:    Filed Vitals:   02/21/15 1743 02/21/15 2036 02/21/15 2256 02/22/15 0536  BP: 123/61 132/57  122/54  Pulse: 101 108 93 100  Temp: 99.4 F (37.4 C) 99.1 F (37.3 C)  98.4 F (36.9 C)  TempSrc: Oral Oral  Oral  Resp: 18 22 20 20   Height:      Weight:      SpO2: 100% 96% 95% 98%    Intake/Output Summary (Last 24 hours) at 02/22/15 0808 Last data filed at 02/21/15 1700  Gross per 24 hour  Intake      0 ml  Output      2 ml  Net     -2 ml   Filed Weights   02/20/15 2359  Weight: 116.121 kg (256 lb)    Exam: Gen:  NAD Cardiovascular: Tachy, RRR Respiratory:  Lungs CTAB Gastrointestinal:  Abdomen soft, tender LLQ and mid upper epigastrium Extremities:  No C/E/C   Data Reviewed:    Labs: Basic Metabolic Panel:  Recent Labs Lab 02/20/15 1950 02/21/15 0547  NA 142 139  K 2.9* 3.0*  CL 102 106  CO2 29 27  GLUCOSE 135* 125*  BUN 7 6  CREATININE 0.85 0.74  CALCIUM 8.7* 7.8*  MG 1.7 1.7  PHOS  --  2.7   GFR Estimated Creatinine Clearance: 100.5 mL/min (by C-G formula based on Cr of 0.74). Liver Function Tests:  Recent Labs Lab 02/20/15 1950 02/21/15 0547  AST 21 11*  ALT 15 10*  ALKPHOS 45 38  BILITOT 1.0  1.0  PROT 7.2 6.1*  ALBUMIN 3.8 3.1*    Recent Labs Lab 02/20/15 1950  LIPASE 19   No results for input(s): AMMONIA in the last 168 hours. Coagulation profile No results for input(s): INR, PROTIME in the last 168 hours.  CBC:  Recent Labs Lab 02/20/15 1950 02/21/15 0547 02/22/15 0725  WBC 10.4 9.8 9.6  HGB 11.7* 9.5* 9.7*  HCT 35.3* 28.8* 30.0*  MCV 83.5 82.8 83.6  PLT 328 292 330   Cardiac Enzymes: No results for input(s): CKTOTAL, CKMB, CKMBINDEX, TROPONINI in the last 168 hours. BNP (last 3 results) No results for input(s): PROBNP in the last 8760 hours. CBG: No results for input(s): GLUCAP in the last 168 hours. D-Dimer: No results for input(s): DDIMER in the last 72 hours. Hgb A1c: No results for input(s): HGBA1C in the last 72 hours. Lipid Profile: No results for input(s): CHOL, HDL, LDLCALC, TRIG, CHOLHDL, LDLDIRECT in the last 72 hours. Thyroid function studies:  Recent Labs  02/21/15 0547  TSH 0.479   Anemia work up: No results for input(s): VITAMINB12, FOLATE, FERRITIN, TIBC, IRON, RETICCTPCT in the last 72 hours. Sepsis Labs:  Recent Labs Lab 02/20/15 1950 02/20/15 2318 02/21/15 [id number]  02/22/15 0725  WBC 10.4  --  9.8 9.6  LATICACIDVEN  --  0.9 0.7  --    Microbiology No results found for this or any previous visit (from the past 240 hour(s)).   Medications:   . ciprofloxacin  400 mg Intravenous Once  . ciprofloxacin  400 mg Intravenous Q12H  . diltiazem  180 mg Oral Daily  . metoprolol  50 mg Oral BID  . metronidazole  500 mg Intravenous Q8H  . sodium chloride  3 mL Intravenous Q12H   Continuous Infusions: . heparin 1,450 Units/hr (02/22/15 0010)    Time spent: 35 minutes with > 50% of time discussing current diagnostic test results, clinical impression and plan of care.   LOS: 2 days   RAMA,CHRISTINA  [facility] Pager [phone]. If unable to reach me by pager, please call my cell phone at [phone].  *Please refer to  [email or web address], password [email or web address] to get updated schedule on who will round on this patient, as hospitalists switch teams weekly. If 7PM-7AM, please contact night-coverage at [email or web address], password [email or web address] for any overnight needs.  02/22/2015, 8:08 AM

## 2015-02-22 NOTE — Progress Notes (Signed)
Pharmacy - IV heparin  Assessment:    Please see note from Thomes Lolling, PharmD earlier today for full details.  Briefly, 50 y.o. female on IV heparin for bridge therapy for Afib while off Apixiban   Most recent heparin level therapeutic on 1600 units/hr  Plan:   Continue IV heparin at 1600 units/hr  Recheck confirmatory level in 6 hrs; can probably omit AM level if confirmatory level is within range  Reuel Boom, PharmD, BCPS Pager: [phone] 02/22/2015, 4:47 PM

## 2015-02-22 NOTE — Progress Notes (Signed)
RT placed patient on CPAP. Patient setting is 12 cmH2O. Sterile water was added to water chamber for humidification. Patient is tolerating well. RT will continue to monitor.

## 2015-02-22 NOTE — Progress Notes (Signed)
ANTICOAGULATION CONSULT NOTE - Follow Up Consult  Pharmacy Consult for Heparin Indication: bridge therapy for Afib while off Apixaban  Allergies  Allergen Reactions  . Benadryl [Diphenhydramine Hcl] Hives and Itching  . Codeine Nausea And Vomiting  . Dilaudid [Hydromorphone Hcl]     "feels like she dying '  . Percocet [Oxycodone-Acetaminophen] Hives and Itching    Patient Measurements: Height: 5\' 2"  (157.5 cm) Weight: 256 lb (116.121 kg) IBW/kg (Calculated) : 50.1 Heparin Dosing Weight:   Vital Signs: Temp: 99.1 F (37.3 C) (01/08 2036) Temp Source: Oral (01/08 2036) BP: 132/57 mmHg (01/08 2036) Pulse Rate: 93 (01/08 2256)  Labs:  Recent Labs  02/20/15 1950 02/21/15 0547 02/21/15 1600 02/21/15 1611 02/21/15 2322  HGB 11.7* 9.5*  --   --   --   HCT 35.3* 28.8*  --   --   --   PLT 328 292  --   --   --   APTT  --   --  34  --  55*  HEPARINUNFRC  --   --   --  <0.10* 0.15*  CREATININE 0.85 0.74  --   --   --     Estimated Creatinine Clearance: 100.5 mL/min (by C-G formula based on Cr of 0.74).   Medications:  Infusions:  . sodium chloride 75 mL/hr at 02/21/15 1844  . heparin 2,000 Units/hr (02/21/15 1723)    Assessment: Patient with low heparin level.  No heparin issues per RN.  Since heparin levels are not being elevated by prior apixaban, will only use heparin levels going forward  Goal of Therapy:  Heparin level 0.3-0.7 units/ml Monitor platelets by anticoagulation protocol: Yes   Plan:  Increase heparin to 1450 units/hr Recheck level at 0700 1/9  Tyler Deis, [employer] 02/22/2015,12:12 AM

## 2015-02-22 NOTE — Progress Notes (Signed)
Subjective: She is still very tender, she says she is better than admit, but even getting up OOB is painful.  No nausea or vomiting.  Objective: Vital signs in last 24 hours: Temp:  [98.4 F (36.9 C)-99.4 F (37.4 C)] 98.4 F (36.9 C) (01/09 0536) Pulse Rate:  [90-108] 100 (01/09 0536) Resp:  [18-22] 20 (01/09 0536) BP: (122-132)/(54-64) 122/54 mmHg (01/09 0536) SpO2:  [95 %-100 %] 98 % (01/09 0536) Last BM Date: 02/20/15 NPO Nothing IV recorded urine 2 recorded Afebrile, VSS Labs OK Film this AM:  Oral contrast seen within colon. Diverticulosis noted. No evidence of bowel obstruction. Intake/Output from previous day: 01/08 0701 - 01/09 0700 In: 0  Out: 2 [Urine:2] Intake/Output this shift:    General appearance: alert, cooperative and no distress GI: soft, diffusely tender all over.  Some gas like pain also.    Lab Results:   Recent Labs  02/21/15 0547 02/22/15 0725  WBC 9.8 9.6  HGB 9.5* 9.7*  HCT 28.8* 30.0*  PLT 292 330    BMET  Recent Labs  02/21/15 0547 02/22/15 0725  NA 139 138  K 3.0* 3.4*  CL 106 107  CO2 27 22  GLUCOSE 125* 103*  BUN 6 6  CREATININE 0.74 0.66  CALCIUM 7.8* 7.9*   PT/INR No results for input(s): LABPROT, INR in the last 72 hours.   Recent Labs Lab 02/20/15 1950 02/21/15 0547  AST 21 11*  ALT 15 10*  ALKPHOS 45 38  BILITOT 1.0 1.0  PROT 7.2 6.1*  ALBUMIN 3.8 3.1*     Lipase     Component Value Date/Time   LIPASE 19 02/20/2015 1950     Studies/Results: Dg Abd 1 View  02/22/2015  CLINICAL DATA:  Sigmoid diverticulitis.  Abdominal pain. EXAM: ABDOMEN - 1 VIEW COMPARISON:  CT on 02/20/2015 FINDINGS: Oral contrast from previous CT is now seen throughout colon. There is no evidence bowel obstruction . Diverticulosis is seen involving the right colon. Right upper quadrant surgical clips are seen from prior cholecystectomy. IMPRESSION: Oral contrast seen within colon. Diverticulosis noted. No evidence of bowel  obstruction. Electronically Signed   By: Earle Gell M.D.   On: 02/22/2015 08:36   Ct Abdomen Pelvis W Contrast  02/20/2015  CLINICAL DATA:  Abdominal pain. Patient believes this is diverticulitis. History of diverticulitis and hypertension as well as sickle cell trait. EXAM: CT ABDOMEN AND PELVIS WITH CONTRAST TECHNIQUE: Multidetector CT imaging of the abdomen and pelvis was performed using the standard protocol following bolus administration of intravenous contrast. CONTRAST:  59mL OMNIPAQUE IOHEXOL 300 MG/ML SOLN, 140mL OMNIPAQUE IOHEXOL 300 MG/ML SOLN COMPARISON:  09/30/2014 FINDINGS: Lung bases: Dependent subsegmental atelectasis. No convincing pneumonia or edema. Heart top-normal in size. Several low-density liver lesions are noted, which do not enhance and are stable from the prior CT consistent with cysts. Largest lies in the posterior segment of the right lobe measuring 1 cm. Liver otherwise unremarkable. Gallbladder surgically absent.  No bile duct dilation. Spleen, pancreas, adrenal glands:  Unremarkable. Kidneys, ureters, bladder:  Normal. Uterus and adnexa:  Unremarkable. Lymph nodes:  No adenopathy. Ascites:  None. Gastrointestinal: Sigmoid colon is redundant. There multiple colonic diverticula. Significant inflammatory changes surround the mid to distal sigmoid colon, with the inflammatory changes centered on to portions of the sigmoid colon, both lying in the right lower quadrant. There is some extraluminal air, but no formed abscess. Other diverticula seen along the remainder of colon with no other inflammatory changes. Stomach and  small bowel are unremarkable. Musculoskeletal:  Unremarkable. IMPRESSION: 1. Sigmoid diverticulitis. There appears to be 2 sections of the mid sigmoid colon there are inflamed, both projecting in the right lower quadrant. Areas associated extraluminal air reflecting diverticular perforation, but no evidence of an abscess. 2. No other acute findings. 3. Multiple other  noninflamed colonic diverticula. 4. Stable small hepatic cysts. Electronically Signed   By: Lajean Manes M.D.   On: 02/20/2015 21:53    Medications: . ciprofloxacin  500 mg Oral BID  . diltiazem  180 mg Oral Daily  . metoprolol  50 mg Oral BID  . metronidazole  500 mg Intravenous Q8H  . sodium chloride  3 mL Intravenous Q12H   . heparin 1,600 Units/hr (02/22/15 [id number])    Assessment/Plan Recurrent diverticulitis Hx of AF on Eliquis, last dose 02/19/15 Hypertension Obesity, BMI 47 Tobacco use, quit 12/2014 Hx of anxiety/depression Chronic back pain Hx of migraine Hx of arthritis Antibiotics:  Day 3 Flagyl, day 2 Cipro DVT:  Heparin drip/SCD   Plan:  I am going to change her to Zosyn, work on mobilizing her.  CT was on 02/20/15.  Will follow with you.      LOS: 2 days    Crystal Brennan 02/22/2015

## 2015-02-23 LAB — BASIC METABOLIC PANEL
ANION GAP: 11 (ref 5–15)
BUN: 7 mg/dL (ref 6–20)
CHLORIDE: 107 mmol/L (ref 101–111)
CO2: 21 mmol/L — ABNORMAL LOW (ref 22–32)
Calcium: 8.5 mg/dL — ABNORMAL LOW (ref 8.9–10.3)
Creatinine, Ser: 0.72 mg/dL (ref 0.44–1.00)
Glucose, Bld: 110 mg/dL — ABNORMAL HIGH (ref 65–99)
POTASSIUM: 3.3 mmol/L — AB (ref 3.5–5.1)
SODIUM: 139 mmol/L (ref 135–145)

## 2015-02-23 LAB — CBC
HEMATOCRIT: 32.2 % — AB (ref 36.0–46.0)
HEMOGLOBIN: 10.4 g/dL — AB (ref 12.0–15.0)
MCH: 26.7 pg (ref 26.0–34.0)
MCHC: 32.3 g/dL (ref 30.0–36.0)
MCV: 82.6 fL (ref 78.0–100.0)
Platelets: 357 10*3/uL (ref 150–400)
RBC: 3.9 MIL/uL (ref 3.87–5.11)
RDW: 14.9 % (ref 11.5–15.5)
WBC: 9.7 10*3/uL (ref 4.0–10.5)

## 2015-02-23 LAB — HEPARIN LEVEL (UNFRACTIONATED): HEPARIN UNFRACTIONATED: 0.35 [IU]/mL (ref 0.30–0.70)

## 2015-02-23 MED ORDER — POTASSIUM CHLORIDE CRYS ER 20 MEQ PO TBCR: 20.0000 meq | EXTENDED_RELEASE_TABLET | Freq: Two times a day (BID) | ORAL | Status: DC

## 2015-02-23 MED ORDER — RANITIDINE HCL 150 MG/10ML PO SYRP
150.0000 mg | ORAL_SOLUTION | Freq: Two times a day (BID) | ORAL | Status: DC
  Administered 2015-02-23 (×2): 150 mg via ORAL
  Filled 2015-02-23 (×5): qty 10

## 2015-02-23 MED ORDER — POTASSIUM CHLORIDE CRYS ER 20 MEQ PO TBCR
40.0000 meq | EXTENDED_RELEASE_TABLET | Freq: Once | ORAL | Status: AC
  Administered 2015-02-23: 40 meq via ORAL
  Filled 2015-02-23: qty 2

## 2015-02-23 MED ORDER — POTASSIUM CHLORIDE 10 MEQ/100ML IV SOLN
10.0000 meq | INTRAVENOUS | Status: DC
  Filled 2015-02-23 (×4): qty 100

## 2015-02-23 MED ORDER — FAMOTIDINE 40 MG/5ML PO SUSR: 20.0000 mg | Freq: Two times a day (BID) | ORAL | Status: DC

## 2015-02-23 MED ORDER — SIMETHICONE 80 MG PO CHEW
160.0000 mg | CHEWABLE_TABLET | Freq: Four times a day (QID) | ORAL | Status: DC | PRN
  Administered 2015-02-23 – 2015-02-27 (×5): 160 mg via ORAL
  Filled 2015-02-23 (×8): qty 2

## 2015-02-23 NOTE — Progress Notes (Signed)
ANTICOAGULATION CONSULT NOTE - Follow Up Consult  Pharmacy Consult for Heparin Indication: bridge therapy for Afib while off Apixiban  Allergies  Allergen Reactions  . Benadryl [Diphenhydramine Hcl] Hives and Itching  . Codeine Nausea And Vomiting  . Dilaudid [Hydromorphone Hcl]     "feels like she dying '  . Percocet [Oxycodone-Acetaminophen] Hives and Itching    Patient Measurements: Height: 5\' 2"  (157.5 cm) Weight: 256 lb (116.121 kg) IBW/kg (Calculated) : 50.1 Heparin Dosing Weight:   Vital Signs: Temp: 100.2 F (37.9 C) (01/09 2114) Temp Source: Oral (01/09 2114) BP: 133/71 mmHg (01/09 2114) Pulse Rate: 101 (01/09 2114)  Labs:  Recent Labs  02/20/15 1950 02/21/15 0547 02/21/15 1600  02/21/15 2322 02/22/15 0725 02/22/15 1553 02/22/15 2204  HGB 11.7* 9.5*  --   --   --  9.7*  --   --   HCT 35.3* 28.8*  --   --   --  30.0*  --   --   PLT 328 292  --   --   --  330  --   --   APTT  --   --  34  --  55*  --   --   --   HEPARINUNFRC  --   --   --   < > 0.15* 0.25* 0.45 0.35  CREATININE 0.85 0.74  --   --   --  0.66  --   --   < > = values in this interval not displayed.  Estimated Creatinine Clearance: 100.5 mL/min (by C-G formula based on Cr of 0.66).   Medications:  Infusions:  . heparin 1,600 Units/hr (02/23/15 [id number])    Assessment: Patient with 2nd heparin level at goal.  No heparin issues noted.  Goal of Therapy:  Heparin level 0.3-0.7 units/ml Monitor platelets by anticoagulation protocol: Yes   Plan:  Continue heparin drip at current rate Recheck level with AM labs  Tyler Deis, Shea Stakes Crowford 02/23/2015,5:22 AM

## 2015-02-23 NOTE — Progress Notes (Signed)
Subjective: She is a little better, complains of gas pain.  Her left side is non-tender this AM, right side is still having some tenderness and soreness she is again attributing to "gas."  Objective: Vital signs in last 24 hours: Temp:  [99.4 F (37.4 C)-100.2 F (37.9 C)] 99.4 F (37.4 C) (01/10 0516) Pulse Rate:  [101-113] 113 (01/10 0516) Resp:  [18-20] 18 (01/10 0516) BP: (133-134)/(71-74) 134/74 mmHg (01/10 0516) SpO2:  [99 %] 99 % (01/10 0516) Last BM Date: 02/20/15 Some IV recorded, nothing else NPO except ice chips/sips with Meds TM 100.2, HR up some but BP stable Labs OK, K+ is down some, WBC is stable at 9.7 Intake/Output from previous day: 01/09 0701 - 01/10 0700 In: 276 [I.V.:176; IV Piggyback:100] Out: -  Intake/Output this shift:    General appearance: alert, cooperative and no distress GI: soft, tender on the right side, few BS, no flatus.  Lab Results:   Recent Labs  02/22/15 0725 02/23/15 0540  WBC 9.6 9.7  HGB 9.7* 10.4*  HCT 30.0* 32.2*  PLT 330 357    BMET  Recent Labs  02/22/15 0725 02/23/15 0540  NA 138 139  K 3.4* 3.3*  CL 107 107  CO2 22 21*  GLUCOSE 103* 110*  BUN 6 7  CREATININE 0.66 0.72  CALCIUM 7.9* 8.5*   PT/INR No results for input(s): LABPROT, INR in the last 72 hours.   Recent Labs Lab 02/20/15 1950 02/21/15 0547  AST 21 11*  ALT 15 10*  ALKPHOS 45 38  BILITOT 1.0 1.0  PROT 7.2 6.1*  ALBUMIN 3.8 3.1*     Lipase     Component Value Date/Time   LIPASE 19 02/20/2015 1950     Studies/Results: Dg Abd 1 View  02/22/2015  CLINICAL DATA:  Sigmoid diverticulitis.  Abdominal pain. EXAM: ABDOMEN - 1 VIEW COMPARISON:  CT on 02/20/2015 FINDINGS: Oral contrast from previous CT is now seen throughout colon. There is no evidence bowel obstruction . Diverticulosis is seen involving the right colon. Right upper quadrant surgical clips are seen from prior cholecystectomy. IMPRESSION: Oral contrast seen within colon.  Diverticulosis noted. No evidence of bowel obstruction. Electronically Signed   By: Earle Gell M.D.   On: 02/22/2015 08:36    Medications: . diltiazem  180 mg Oral Daily  . metoprolol  50 mg Oral BID  . piperacillin-tazobactam (ZOSYN)  IV  3.375 g Intravenous 3 times per day  . potassium chloride  10 mEq Intravenous Q1 Hr x 4  . sodium chloride  3 mL Intravenous Q12H   . heparin 1,600 Units/hr (02/23/15 0204)   Prior to Admission medications   Medication Sig Start Date End Date Taking? Authorizing Provider  apixaban (ELIQUIS) 5 MG TABS tablet Take 1 tablet (5 mg total) by mouth 2 (two) times daily. 01/26/15  Yes Lelon Perla, MD  Aspirin Effervescent (ALKA-SELTZER PO) Take 2 tablets by mouth 2 (two) times daily as needed (stomach pains).   Yes Historical Provider, MD  Chlorpheniramine Maleate (CHLOR-TRIMETON PO) Take 2 tablets by mouth daily as needed (allergies).   Yes Historical Provider, MD  diltiazem (CARDIZEM CD) 180 MG 24 hr capsule Take 1 capsule (180 mg total) by mouth daily. 01/26/15  Yes Lelon Perla, MD  hydrochlorothiazide (HYDRODIURIL) 25 MG tablet Take 1 tablet (25 mg total) by mouth daily. 01/26/15  Yes Lelon Perla, MD  ibuprofen (ADVIL,MOTRIN) 200 MG tablet Take 800 mg by mouth every 6 (six) hours as  needed for moderate pain or cramping.   Yes Historical Provider, MD  metoprolol (LOPRESSOR) 50 MG tablet Take 1 tablet (50 mg total) by mouth 2 (two) times daily. 01/26/15  Yes Lelon Perla, MD  ketorolac (TORADOL) 10 MG tablet Take 1 tablet (10 mg total) by mouth every 6 (six) hours as needed. 04/18/14   Larene Pickett, PA-C     Assessment/Plan Recurrent diverticulitis Hx of AF on Eliquis, last dose 02/19/15 Hypertension Obesity, BMI 47 Tobacco use, quit 12/2014 Hx of anxiety/depression Chronic back pain Hx of migraine Hx of arthritis Antibiotics: Day 3 Flagyl, day 2 Cipro DVT: Heparin drip/SCD  labs:  Daily CBC ordered   Plan:  i will let her try  some clears, I told her to go slowly.  I am going to add some pepcid and see if that helps also.  She has allot of NSAID's listed above, worry about something besides just diverticular disease.  She has been up walking already this AM.    LOS: 3 days    Crystal Brennan 02/23/2015

## 2015-02-23 NOTE — Progress Notes (Addendum)
Progress Note   Crystal Brennan [id number] DOB: Jun 21, 1965 DOA: 02/20/2015 PCP: No PCP Per Patient   Brief Narrative:   50 year old female patient with history of A. fib on Eliquis, HTN, OSA on nightly CPAP and recurrent diverticulitis (3 prior episodes) presented to the [facility] ED on 02/20/15 with complaints of 2 day history of abdominal pain similar to prior episodes of diverticulitis. No nausea or vomiting reported. Possible constipation-last p.m. 02/20/15. CT abdomen in ED confirmed sigmoid diverticulitis and microperforation. Treating conservatively. Surgeons following.  Assessment/Plan:   Recurrent diverticulitis with microperforation - Treating conservatively with bowel rest (NPO except meds), IV fluids, IV antibiotics (Cipro and sent Flagyl) and pain management. - Elective colon resection preferred over colon resection now, which will certainly require a colostomy.  - If surgery planned, will need Cardiology preop clearance. - Advance diet to clear liquid.  Hypokalemia - Replaced aggressively. Magnesium normal. Will give KCL PO today.  Anemia - Baseline hemoglobin probably in the 11 g per DL range. Stable over the past 48 hours  Chronic diastolic CHF - Compensated. Monitor closely while being hydrated.   Proximal A. Fib - Anticoagulated on Eliquis at home- last dose 02/19/2015. Currently held in case urgent surgery is required.  - Continue IV heparin for now.  - CHA2DS2-VASc score: at least 3. - Continue home dose of Cardizem & Metoprolol. Hold HCTZ. Continue monitoring on telemetry.  Essential hypertension - Controlled. Transient hypotension in ED which has resolved.   Morbid obesity/Body mass index is 46.81 kg/(m^2)/OSA - Continue nightly CPAP.  Tobacco abuse - States that she quit in November 2016 and denies alcohol use.  History of anxiety and depression   DVT Prophylaxis - Currently on a heparin drip.   Family Communication/Anticipated D/C  date and plan/Code Status   Family Communication: Husband at the bedside. Disposition Plan: Home when diverticulitis appears to be resolving. Anticipated D/C date:   2-3 days. Code Status: Full code.   IV Access:    Peripheral IV   Procedures and diagnostic studies:   Ct Abdomen Pelvis W Contrast  02/20/2015  CLINICAL DATA:  Abdominal pain. Patient believes this is diverticulitis. History of diverticulitis and hypertension as well as sickle cell trait. EXAM: CT ABDOMEN AND PELVIS WITH CONTRAST TECHNIQUE: Multidetector CT imaging of the abdomen and pelvis was performed using the standard protocol following bolus administration of intravenous contrast. CONTRAST:  86mL OMNIPAQUE IOHEXOL 300 MG/ML SOLN, 158mL OMNIPAQUE IOHEXOL 300 MG/ML SOLN COMPARISON:  09/30/2014 FINDINGS: Lung bases: Dependent subsegmental atelectasis. No convincing pneumonia or edema. Heart top-normal in size. Several low-density liver lesions are noted, which do not enhance and are stable from the prior CT consistent with cysts. Largest lies in the posterior segment of the right lobe measuring 1 cm. Liver otherwise unremarkable. Gallbladder surgically absent.  No bile duct dilation. Spleen, pancreas, adrenal glands:  Unremarkable. Kidneys, ureters, bladder:  Normal. Uterus and adnexa:  Unremarkable. Lymph nodes:  No adenopathy. Ascites:  None. Gastrointestinal: Sigmoid colon is redundant. There multiple colonic diverticula. Significant inflammatory changes surround the mid to distal sigmoid colon, with the inflammatory changes centered on to portions of the sigmoid colon, both lying in the right lower quadrant. There is some extraluminal air, but no formed abscess. Other diverticula seen along the remainder of colon with no other inflammatory changes. Stomach and small bowel are unremarkable. Musculoskeletal:  Unremarkable. IMPRESSION: 1. Sigmoid diverticulitis. There appears to be 2 sections of the mid sigmoid colon there are  inflamed,  both projecting in the right lower quadrant. Areas associated extraluminal air reflecting diverticular perforation, but no evidence of an abscess. 2. No other acute findings. 3. Multiple other noninflamed colonic diverticula. 4. Stable small hepatic cysts. Electronically Signed   By: Lajean Manes M.D.   On: 02/20/2015 21:53     Medical Consultants:    Surgery: Alphonsa Overall, MD  Anti-Infectives:    Cipro 02/20/15  >  Flagyl 02/20/15  >  Subjective:   Crystal Brennan is having RLQ and midepigastric pain, colicy.  Not passing flatus.  Says the simethicone didn't help any yesterday.  Objective:    Filed Vitals:   02/21/15 2256 02/22/15 0536 02/22/15 2114 02/23/15 0516  BP:  122/54 133/71 134/74  Pulse: 93 100 101 113  Temp:  98.4 F (36.9 C) 100.2 F (37.9 C) 99.4 F (37.4 C)  TempSrc:  Oral Oral Oral  Resp: 20 20 20 18   Height:      Weight:      SpO2: 95% 98%  99%    Intake/Output Summary (Last 24 hours) at 02/23/15 0758 Last data filed at 02/23/15 0522  Gross per 24 hour  Intake    276 ml  Output      0 ml  Net    276 ml   Filed Weights   02/20/15 2359  Weight: 116.121 kg (256 lb)    Exam: Gen:  Uncomfortable Cardiovascular: Tachy, RRR Respiratory:  Lungs CTAB Gastrointestinal:  Abdomen soft, tender, +BS Extremities:  LLE swelling   Data Reviewed:    Labs: Basic Metabolic Panel:  Recent Labs Lab 02/20/15 1950 02/21/15 0547 02/22/15 0725 02/23/15 0540  NA 142 139 138 139  K 2.9* 3.0* 3.4* 3.3*  CL 102 106 107 107  CO2 29 27 22  21*  GLUCOSE 135* 125* 103* 110*  BUN 7 6 6 7   CREATININE 0.85 0.74 0.66 0.72  CALCIUM 8.7* 7.8* 7.9* 8.5*  MG 1.7 1.7  --   --   PHOS  --  2.7  --   --    GFR Estimated Creatinine Clearance: 100.5 mL/min (by C-G formula based on Cr of 0.72). Liver Function Tests:  Recent Labs Lab 02/20/15 1950 02/21/15 0547  AST 21 11*  ALT 15 10*  ALKPHOS 45 38  BILITOT 1.0 1.0  PROT 7.2 6.1*  ALBUMIN 3.8 3.1*     Recent Labs Lab 02/20/15 1950  LIPASE 19   No results for input(s): AMMONIA in the last 168 hours. Coagulation profile No results for input(s): INR, PROTIME in the last 168 hours.  CBC:  Recent Labs Lab 02/20/15 1950 02/21/15 0547 02/22/15 0725 02/23/15 0540  WBC 10.4 9.8 9.6 9.7  HGB 11.7* 9.5* 9.7* 10.4*  HCT 35.3* 28.8* 30.0* 32.2*  MCV 83.5 82.8 83.6 82.6  PLT 328 292 330 357   Cardiac Enzymes: No results for input(s): CKTOTAL, CKMB, CKMBINDEX, TROPONINI in the last 168 hours. BNP (last 3 results) No results for input(s): PROBNP in the last 8760 hours. CBG: No results for input(s): GLUCAP in the last 168 hours. D-Dimer: No results for input(s): DDIMER in the last 72 hours. Hgb A1c: No results for input(s): HGBA1C in the last 72 hours. Lipid Profile: No results for input(s): CHOL, HDL, LDLCALC, TRIG, CHOLHDL, LDLDIRECT in the last 72 hours. Thyroid function studies:  Recent Labs  02/21/15 0547  TSH 0.479   Anemia work up: No results for input(s): VITAMINB12, FOLATE, FERRITIN, TIBC, IRON, RETICCTPCT in the last 72 hours. Sepsis  Labs:  Recent Labs Lab 02/20/15 1950 02/20/15 2318 02/21/15 0547 02/22/15 0725 02/23/15 0540  WBC 10.4  --  9.8 9.6 9.7  LATICACIDVEN  --  0.9 0.7  --   --    Microbiology No results found for this or any previous visit (from the past 240 hour(s)).   Medications:   . diltiazem  180 mg Oral Daily  . metoprolol  50 mg Oral BID  . piperacillin-tazobactam (ZOSYN)  IV  3.375 g Intravenous 3 times per day  . sodium chloride  3 mL Intravenous Q12H   Continuous Infusions: . heparin 1,600 Units/hr (02/23/15 0204)    Time spent: 25 minutes.   LOS: 3 days   [facility] Pager [phone]. If unable to reach me by pager, please call my cell phone at [phone].  *Please refer to [email or web address], password [email or web address] to get updated schedule on who will round on this patient, as hospitalists switch teams weekly. If  7PM-7AM, please contact night-coverage at [email or web address], password [email or web address] for any overnight needs.  02/23/2015, 7:58 AM

## 2015-02-24 LAB — BASIC METABOLIC PANEL
Anion gap: 10 (ref 5–15)
BUN: 7 mg/dL (ref 6–20)
CHLORIDE: 107 mmol/L (ref 101–111)
CO2: 21 mmol/L — AB (ref 22–32)
Calcium: 8.5 mg/dL — ABNORMAL LOW (ref 8.9–10.3)
Creatinine, Ser: 0.69 mg/dL (ref 0.44–1.00)
GFR calc Af Amer: >60 mL/min (ref >60)
GFR calc non Af Amer: >60 mL/min (ref >60)
Glucose, Bld: 105 mg/dL — ABNORMAL HIGH (ref 65–99)
POTASSIUM: 3.5 mmol/L (ref 3.5–5.1)
SODIUM: 138 mmol/L (ref 135–145)

## 2015-02-24 LAB — CBC
HEMATOCRIT: 31.4 % — AB (ref 36.0–46.0)
HEMOGLOBIN: 10.1 g/dL — AB (ref 12.0–15.0)
MCH: 26.1 pg (ref 26.0–34.0)
MCHC: 32.2 g/dL (ref 30.0–36.0)
MCV: 81.1 fL (ref 78.0–100.0)
Platelets: 344 10*3/uL (ref 150–400)
RBC: 3.87 MIL/uL (ref 3.87–5.11)
RDW: 14.8 % (ref 11.5–15.5)
WBC: 8.5 10*3/uL (ref 4.0–10.5)

## 2015-02-24 LAB — MAGNESIUM: MAGNESIUM: 2 mg/dL (ref 1.7–2.4)

## 2015-02-24 LAB — HEPARIN LEVEL (UNFRACTIONATED): Heparin Unfractionated: 0.43 IU/mL (ref 0.30–0.70)

## 2015-02-24 MED ORDER — SODIUM CHLORIDE 0.9 % IV SOLN
INTRAVENOUS | Status: DC
  Administered 2015-02-24 – 2015-03-01 (×7): via INTRAVENOUS
  Filled 2015-02-24 (×23): qty 1000

## 2015-02-24 MED ORDER — SODIUM CHLORIDE 0.9 % IV SOLN: INTRAVENOUS | Status: DC

## 2015-02-24 MED ORDER — RANITIDINE NICU ORAL SOLUTION 25 MG/ML: 2.0000 mg/kg | Freq: Two times a day (BID) | ORAL | Status: DC

## 2015-02-24 MED ORDER — PANTOPRAZOLE SODIUM 40 MG IV SOLR
40.0000 mg | Freq: Two times a day (BID) | INTRAVENOUS | Status: DC
  Administered 2015-02-24: 40 mg via INTRAVENOUS
  Filled 2015-02-24 (×2): qty 40

## 2015-02-24 MED ORDER — RANITIDINE HCL 150 MG/10ML PO SYRP
150.0000 mg | ORAL_SOLUTION | Freq: Two times a day (BID) | ORAL | Status: DC
  Administered 2015-02-24 – 2015-03-05 (×16): 150 mg via ORAL
  Filled 2015-02-24 (×22): qty 10

## 2015-02-24 MED ORDER — KETOROLAC TROMETHAMINE 30 MG/ML IJ SOLN
30.0000 mg | Freq: Three times a day (TID) | INTRAMUSCULAR | Status: AC
Start: 2015-02-24 — End: 2015-02-25
  Administered 2015-02-24 – 2015-02-25 (×6): 30 mg via INTRAVENOUS
  Filled 2015-02-24 (×7): qty 1

## 2015-02-24 NOTE — Progress Notes (Signed)
Progress Note   ALAJA KANIS [id number] DOB: 10-21-1965 DOA: 02/20/2015 PCP: No PCP Per Patient   Brief Narrative:   50 year old female patient with history of A. fib on Eliquis, HTN, OSA on nightly CPAP and recurrent diverticulitis (3 prior episodes) presented to the [facility] ED on 02/20/15 with complaints of 2 day history of abdominal pain similar to prior episodes of diverticulitis. No nausea or vomiting reported. Possible constipation-last p.m. 02/20/15. CT abdomen in ED confirmed sigmoid diverticulitis and microperforation. Treating conservatively. Surgeons following.  Assessment/Plan:   Recurrent diverticulitis with microperforation - Treating conservatively with bowel rest (NPO except meds), IV fluids, initially on  IV antibiotics (Cipro and sent Flagyl) ,  currently on IV Zosyn , and pain management. - Elective colon resection preferred over colon resection now, which will certainly require a colostomy.  - If surgery planned, will need Cardiology preop clearance. - Management per surgery, may need to repeat CT abdomen in a.m. for further evaluation.  Hypokalemia - Depleted with IV fluids., recheck in am.  Anemia - Baseline hemoglobin probably in the 11 g per DL range. Hemoglobin 9.5-may be dilutional. Stable over the past 24 hours  Chronic diastolic CHF - Compensated or maybe even a little little dry. Monitor closely while being hydrated.   Proximal A. Fib - Anticoagulated on Eliquis at home- last dose 02/19/2015. Currently held in case urgent surgery is required.  - Continue IV heparin for now.  - CHA2DS2-VASc score: at least 3. - Continue home dose of Cardizem & Metoprolol. Hold HCTZ. Continue monitoring on telemetry.  Essential hypertension - Controlled. Transient hypotension in ED which has resolved.   Morbid obesity/Body mass index is 46.81 kg/(m^2)/OSA - Continue nightly CPAP.  Tobacco abuse - States that she quit in November 2016 and denies  alcohol use.  History of anxiety and depression   DVT Prophylaxis - Currently on a heparin drip.   Family Communication/Anticipated D/C date and plan/Code Status   Family Communication: Daughter at the bedside. Disposition Plan: Home when diverticulitis appears to be resolving. Anticipated D/C date:   pending clinical course. Code Status: Full code.   IV Access:    Peripheral IV   Procedures and diagnostic studies:   Ct Abdomen Pelvis W Contrast  02/20/2015  CLINICAL DATA:  Abdominal pain. Patient believes this is diverticulitis. History of diverticulitis and hypertension as well as sickle cell trait. EXAM: CT ABDOMEN AND PELVIS WITH CONTRAST TECHNIQUE: Multidetector CT imaging of the abdomen and pelvis was performed using the standard protocol following bolus administration of intravenous contrast. CONTRAST:  81mL OMNIPAQUE IOHEXOL 300 MG/ML SOLN, 150mL OMNIPAQUE IOHEXOL 300 MG/ML SOLN COMPARISON:  09/30/2014 FINDINGS: Lung bases: Dependent subsegmental atelectasis. No convincing pneumonia or edema. Heart top-normal in size. Several low-density liver lesions are noted, which do not enhance and are stable from the prior CT consistent with cysts. Largest lies in the posterior segment of the right lobe measuring 1 cm. Liver otherwise unremarkable. Gallbladder surgically absent.  No bile duct dilation. Spleen, pancreas, adrenal glands:  Unremarkable. Kidneys, ureters, bladder:  Normal. Uterus and adnexa:  Unremarkable. Lymph nodes:  No adenopathy. Ascites:  None. Gastrointestinal: Sigmoid colon is redundant. There multiple colonic diverticula. Significant inflammatory changes surround the mid to distal sigmoid colon, with the inflammatory changes centered on to portions of the sigmoid colon, both lying in the right lower quadrant. There is some extraluminal air, but no formed abscess. Other diverticula seen along the remainder of colon with no other inflammatory  changes. Stomach and small bowel  are unremarkable. Musculoskeletal:  Unremarkable. IMPRESSION: 1. Sigmoid diverticulitis. There appears to be 2 sections of the mid sigmoid colon there are inflamed, both projecting in the right lower quadrant. Areas associated extraluminal air reflecting diverticular perforation, but no evidence of an abscess. 2. No other acute findings. 3. Multiple other noninflamed colonic diverticula. 4. Stable small hepatic cysts. Electronically Signed   By: Lajean Manes M.D.   On: 02/20/2015 21:53     Medical Consultants:    Surgery  Anti-Infectives:    Cipro 02/20/15  >1/9  Flagyl 02/20/15  >1/9  Zosyn 02/22/15  Subjective:   Crystal Brennan still complaining of abdominal pain, no nausea or vomiting.  Objective:    Filed Vitals:   02/23/15 0516 02/23/15 1436 02/23/15 2054 02/24/15 0550  BP: 134/74 107/46 123/74 109/84  Pulse: 113 95 99 95  Temp: 99.4 F (37.4 C) 98.7 F (37.1 C) 99 F (37.2 C) 98.3 F (36.8 C)  TempSrc: Oral Axillary Oral Oral  Resp: 18 18 18 18   Height:      Weight:      SpO2: 99% 98% 97% 100%    Intake/Output Summary (Last 24 hours) at 02/24/15 1318 Last data filed at 02/23/15 1700  Gross per 24 hour  Intake    240 ml  Output      0 ml  Net    240 ml   Filed Weights   02/20/15 2359  Weight: 116.121 kg (256 lb)    Exam: Gen:  NAD Cardiovascular: Tachy, RRR Respiratory:  Lungs CTAB Gastrointestinal:  Abdomen soft, tender LLQ and mid upper epigastrium Extremities:  No C/E/C   Data Reviewed:    Labs: Basic Metabolic Panel:  Recent Labs Lab 02/20/15 1950 02/21/15 0547 02/22/15 0725 02/23/15 0540 02/24/15 0550  NA 142 139 138 139 138  K 2.9* 3.0* 3.4* 3.3* 3.5  CL 102 106 107 107 107  CO2 29 27 22  21* 21*  GLUCOSE 135* 125* 103* 110* 105*  BUN 7 6 6 7 7   CREATININE 0.85 0.74 0.66 0.72 0.69  CALCIUM 8.7* 7.8* 7.9* 8.5* 8.5*  MG 1.7 1.7  --   --  2.0  PHOS  --  2.7  --   --   --    GFR Estimated Creatinine Clearance: 100.5 mL/min (by C-G  formula based on Cr of 0.69). Liver Function Tests:  Recent Labs Lab 02/20/15 1950 02/21/15 0547  AST 21 11*  ALT 15 10*  ALKPHOS 45 38  BILITOT 1.0 1.0  PROT 7.2 6.1*  ALBUMIN 3.8 3.1*    Recent Labs Lab 02/20/15 1950  LIPASE 19   No results for input(s): AMMONIA in the last 168 hours. Coagulation profile No results for input(s): INR, PROTIME in the last 168 hours.  CBC:  Recent Labs Lab 02/20/15 1950 02/21/15 0547 02/22/15 0725 02/23/15 0540 02/24/15 0550  WBC 10.4 9.8 9.6 9.7 8.5  HGB 11.7* 9.5* 9.7* 10.4* 10.1*  HCT 35.3* 28.8* 30.0* 32.2* 31.4*  MCV 83.5 82.8 83.6 82.6 81.1  PLT 328 292 330 357 344   Cardiac Enzymes: No results for input(s): CKTOTAL, CKMB, CKMBINDEX, TROPONINI in the last 168 hours. BNP (last 3 results) No results for input(s): PROBNP in the last 8760 hours. CBG: No results for input(s): GLUCAP in the last 168 hours. D-Dimer: No results for input(s): DDIMER in the last 72 hours. Hgb A1c: No results for input(s): HGBA1C in the last 72 hours. Lipid Profile: No  results for input(s): CHOL, HDL, LDLCALC, TRIG, CHOLHDL, LDLDIRECT in the last 72 hours. Thyroid function studies: No results for input(s): TSH, T4TOTAL, T3FREE, THYROIDAB in the last 72 hours.  Invalid input(s): FREET3 Anemia work up: No results for input(s): VITAMINB12, FOLATE, FERRITIN, TIBC, IRON, RETICCTPCT in the last 72 hours. Sepsis Labs:  Recent Labs Lab 02/20/15 2318 02/21/15 0547 02/22/15 0725 02/23/15 0540 02/24/15 0550  WBC  --  9.8 9.6 9.7 8.5  LATICACIDVEN 0.9 0.7  --   --   --    Microbiology No results found for this or any previous visit (from the past 240 hour(s)).   Medications:   . diltiazem  180 mg Oral Daily  . ketorolac  30 mg Intravenous 3 times per day  . metoprolol  50 mg Oral BID  . pantoprazole (PROTONIX) IV  40 mg Intravenous Q12H  . piperacillin-tazobactam (ZOSYN)  IV  3.375 g Intravenous 3 times per day  . sodium chloride  3 mL  Intravenous Q12H   Continuous Infusions: . heparin 1,600 Units/hr (02/24/15 1151)  . 0.9 % sodium chloride with kcl 100 mL/hr at 02/24/15 0954    Time spent: 30 minutes with > 50% of time discussing current diagnostic test results, clinical impression and plan of care.   LOS: 4 days   [facility], [place]  [facility] Pager [phone]  *Please refer to [email or web address], password [email or web address] to get updated schedule on who will round on this patient, as hospitalists switch teams weekly. If 7PM-7AM, please contact night-coverage at [email or web address], password [email or web address] for any overnight needs.  02/24/2015, 1:18 PM

## 2015-02-24 NOTE — Progress Notes (Signed)
Subjective: She is still having allot of pain.  Mostly RLQ, she says the Zantac helps some but "gas pains," persist also.  Overall she does not look significantly different than she did when I say her on 02/22/15. She says the clears make her cramp more. Objective: Vital signs in last 24 hours: Temp:  [98.3 F (36.8 C)-99 F (37.2 C)] 98.3 F (36.8 C) (01/11 0550) Pulse Rate:  [95-99] 95 (01/11 0550) Resp:  [18] 18 (01/11 0550) BP: (107-123)/(46-84) 109/84 mmHg (01/11 0550) SpO2:  [97 %-100 %] 100 % (01/11 0550) Last BM Date: 02/23/15 720 PO clear diet Urine ? "3" Afebrile, VSS Labs still good and no real change CT scan was on 02/20/15 Intake/Output from previous day: 01/10 0701 - 01/11 0700 In: 720 [P.O.:720] Out: 3 [Urine:3] Intake/Output this shift:    General appearance: alert, cooperative and she continues to have pain, nothing has really made her that much better. GI: soft, tender it hurts to move, especially RLQ, Few BS, No BM  Lab Results:   Recent Labs  02/23/15 0540 02/24/15 0550  WBC 9.7 8.5  HGB 10.4* 10.1*  HCT 32.2* 31.4*  PLT 357 344    BMET  Recent Labs  02/23/15 0540 02/24/15 0550  NA 139 138  K 3.3* 3.5  CL 107 107  CO2 21* 21*  GLUCOSE 110* 105*  BUN 7 7  CREATININE 0.72 0.69  CALCIUM 8.5* 8.5*   PT/INR No results for input(s): LABPROT, INR in the last 72 hours.   Recent Labs Lab 02/20/15 1950 02/21/15 0547  AST 21 11*  ALT 15 10*  ALKPHOS 45 38  BILITOT 1.0 1.0  PROT 7.2 6.1*  ALBUMIN 3.8 3.1*     Lipase     Component Value Date/Time   LIPASE 19 02/20/2015 1950     Studies/Results: Dg Abd 1 View  02/22/2015  CLINICAL DATA:  Sigmoid diverticulitis.  Abdominal pain. EXAM: ABDOMEN - 1 VIEW COMPARISON:  CT on 02/20/2015 FINDINGS: Oral contrast from previous CT is now seen throughout colon. There is no evidence bowel obstruction . Diverticulosis is seen involving the right colon. Right upper quadrant surgical clips are seen  from prior cholecystectomy. IMPRESSION: Oral contrast seen within colon. Diverticulosis noted. No evidence of bowel obstruction. Electronically Signed   By: Earle Gell M.D.   On: 02/22/2015 08:36    Medications: . diltiazem  180 mg Oral Daily  . metoprolol  50 mg Oral BID  . piperacillin-tazobactam (ZOSYN)  IV  3.375 g Intravenous 3 times per day  . ranitidine  150 mg Oral BID  . sodium chloride  3 mL Intravenous Q12H   . heparin 1,600 Units/hr (02/23/15 1953)   Prior to Admission medications   Medication Sig Start Date End Date Taking? Authorizing Provider  apixaban (ELIQUIS) 5 MG TABS tablet Take 1 tablet (5 mg total) by mouth 2 (two) times daily. 01/26/15  Yes Lelon Perla, MD  Aspirin Effervescent (ALKA-SELTZER PO) Take 2 tablets by mouth 2 (two) times daily as needed (stomach pains).   Yes Historical Provider, MD  Chlorpheniramine Maleate (CHLOR-TRIMETON PO) Take 2 tablets by mouth daily as needed (allergies).   Yes Historical Provider, MD  diltiazem (CARDIZEM CD) 180 MG 24 hr capsule Take 1 capsule (180 mg total) by mouth daily. 01/26/15  Yes Lelon Perla, MD  hydrochlorothiazide (HYDRODIURIL) 25 MG tablet Take 1 tablet (25 mg total) by mouth daily. 01/26/15  Yes Lelon Perla, MD  ibuprofen (ADVIL,MOTRIN) 200  MG tablet Take 800 mg by mouth every 6 (six) hours as needed for moderate pain or cramping.   Yes Historical Provider, MD  metoprolol (LOPRESSOR) 50 MG tablet Take 1 tablet (50 mg total) by mouth 2 (two) times daily. 01/26/15  Yes Lelon Perla, MD  ketorolac (TORADOL) 10 MG tablet Take 1 tablet (10 mg total) by mouth every 6 (six) hours as needed. 04/18/14   Larene Pickett, PA-C    Assessment/Plan Recurrent diverticulitis Hx of AF on Eliquis, last dose 02/19/15 Hypertension Obesity, BMI 47 Tobacco use, quit 12/2014 Hx of anxiety/depression Chronic back pain Hx of migraine Hx of arthritis Antibiotics: Day 3 Flagyl, day 2 Cipro completed, day 3 Zosyn DVT:  Heparin drip/SCD   Plan:  Continue her Zosyn, day 3, I am putting her back on IV fluids, changing her to PPI, I have added Toradol IV.  I will recheck her labs, and I think we need to repeat her CT in AM unless she shows marked improvement.    LOS: 4 days    Lenola Lockner 02/24/2015

## 2015-02-24 NOTE — Progress Notes (Signed)
ANTICOAGULATION CONSULT NOTE - Follow Up Consult  Pharmacy Consult for Heparin Indication: bridge therapy for Afib while off Apixaban  Allergies  Allergen Reactions  . Benadryl [Diphenhydramine Hcl] Hives and Itching  . Codeine Nausea And Vomiting  . Dilaudid [Hydromorphone Hcl]     "feels like she dying '  . Percocet [Oxycodone-Acetaminophen] Hives and Itching    Patient Measurements: Height: 5\' 2"  (157.5 cm) Weight: 256 lb (116.121 kg) IBW/kg (Calculated) : 50.1 HEPARIN DW (KG): 78.7  Vital Signs: Temp: 98.3 F (36.8 C) (01/11 0550) Temp Source: Oral (01/11 0550) BP: 109/84 mmHg (01/11 0550) Pulse Rate: 95 (01/11 0550)  Labs:  Recent Labs  02/21/15 1600  02/21/15 2322  02/22/15 0725  02/22/15 2204 02/23/15 0540 02/24/15 0550  HGB  --   --   --   < > 9.7*  --   --  10.4* 10.1*  HCT  --   --   --   --  30.0*  --   --  32.2* 31.4*  PLT  --   --   --   --  330  --   --  357 344  APTT 34  --  55*  --   --   --   --   --   --   HEPARINUNFRC  --   < > 0.15*  --  0.25*  < > 0.35 0.35 0.43  CREATININE  --   --   --   --  0.66  --   --  0.72 0.69  < > = values in this interval not displayed.  Estimated Creatinine Clearance: 100.5 mL/min (by C-G formula based on Cr of 0.69).   Medications:  Infusions:  . sodium chloride    . heparin 1,600 Units/hr (02/23/15 1953)  . 0.9 % sodium chloride with kcl 100 mL/hr at 02/24/15 [id number]  PTA: apixaban 5mg  BID LD 1/6@ 2100  Assessment: 50 yo F admitted with sigmoid diverticulitis and microperforation currently being treated conservatively with bowel rest and IV antibiotics.  She is on chronic Apixaban for hx Afib which was held on admission.  Pharmacy asked to manage heparin bridge while off apixaban.  02/24/2015:  Heparin level therapeutic (rate verified at bedside, no interruptions with IV site in which IV heparin infusing)   CBC stable.  No bleeding noted per patient but explained what to look for and inform RN of anything  unusual  Goal of Therapy:  Heparin level 0.3-0.7 units/ml Monitor platelets by anticoagulation protocol: Yes   Plan:  1) Continue IV heparin at current rate of 1600 units/hr 2) Daily heparin level   Adrian Saran, PharmD, BCPS Pager [phone] 02/24/2015 11:16 AM

## 2015-02-25 ENCOUNTER — Inpatient Hospital Stay (HOSPITAL_COMMUNITY): Payer: Commercial Managed Care - PPO

## 2015-02-25 LAB — CBC
HCT: 29.1 % — ABNORMAL LOW (ref 36.0–46.0)
HEMOGLOBIN: 9.4 g/dL — AB (ref 12.0–15.0)
MCH: 26.6 pg (ref 26.0–34.0)
MCHC: 32.3 g/dL (ref 30.0–36.0)
MCV: 82.4 fL (ref 78.0–100.0)
PLATELETS: 341 10*3/uL (ref 150–400)
RBC: 3.53 MIL/uL — ABNORMAL LOW (ref 3.87–5.11)
RDW: 15 % (ref 11.5–15.5)
WBC: 8.1 10*3/uL (ref 4.0–10.5)

## 2015-02-25 LAB — COMPREHENSIVE METABOLIC PANEL
ALT: 9 U/L — ABNORMAL LOW (ref 14–54)
ANION GAP: 10 (ref 5–15)
AST: 11 U/L — ABNORMAL LOW (ref 15–41)
Albumin: 2.8 g/dL — ABNORMAL LOW (ref 3.5–5.0)
Alkaline Phosphatase: 35 U/L — ABNORMAL LOW (ref 38–126)
BUN: 6 mg/dL (ref 6–20)
CHLORIDE: 105 mmol/L (ref 101–111)
CO2: 24 mmol/L (ref 22–32)
Calcium: 8.3 mg/dL — ABNORMAL LOW (ref 8.9–10.3)
Creatinine, Ser: 0.71 mg/dL (ref 0.44–1.00)
Glucose, Bld: 118 mg/dL — ABNORMAL HIGH (ref 65–99)
Potassium: 3.6 mmol/L (ref 3.5–5.1)
SODIUM: 139 mmol/L (ref 135–145)
Total Bilirubin: 0.8 mg/dL (ref 0.3–1.2)
Total Protein: 6.1 g/dL — ABNORMAL LOW (ref 6.5–8.1)

## 2015-02-25 LAB — HEPARIN LEVEL (UNFRACTIONATED)
HEPARIN UNFRACTIONATED: 0.17 [IU]/mL — AB (ref 0.30–0.70)
HEPARIN UNFRACTIONATED: 0.21 [IU]/mL — AB (ref 0.30–0.70)
HEPARIN UNFRACTIONATED: 0.24 [IU]/mL — AB (ref 0.30–0.70)

## 2015-02-25 LAB — PREALBUMIN: PREALBUMIN: 5.8 mg/dL — AB (ref 18–38)

## 2015-02-25 MED ORDER — IOHEXOL 300 MG/ML  SOLN
25.0000 mL | INTRAMUSCULAR | Status: AC
  Administered 2015-02-25 (×2): 25 mL via ORAL

## 2015-02-25 MED ORDER — SODIUM CHLORIDE 0.9 % IV SOLN
1.0000 g | INTRAVENOUS | Status: DC
  Administered 2015-02-25 – 2015-03-05 (×9): 1 g via INTRAVENOUS
  Filled 2015-02-25 (×10): qty 1

## 2015-02-25 MED ORDER — MORPHINE SULFATE (PF) 2 MG/ML IV SOLN
2.0000 mg | INTRAVENOUS | Status: DC | PRN
  Administered 2015-02-25 (×4): 6 mg via INTRAVENOUS
  Administered 2015-02-25: 4 mg via INTRAVENOUS
  Administered 2015-02-25: 6 mg via INTRAVENOUS
  Administered 2015-02-26 (×4): 4 mg via INTRAVENOUS
  Administered 2015-02-26: 6 mg via INTRAVENOUS
  Administered 2015-02-26: 4 mg via INTRAVENOUS
  Administered 2015-02-27 (×3): 6 mg via INTRAVENOUS
  Administered 2015-02-27: 2 mg via INTRAVENOUS
  Administered 2015-02-27: 6 mg via INTRAVENOUS
  Administered 2015-02-27: 4 mg via INTRAVENOUS
  Administered 2015-02-28: 2 mg via INTRAVENOUS
  Administered 2015-02-28 – 2015-03-01 (×4): 6 mg via INTRAVENOUS
  Administered 2015-03-01: 2 mg via INTRAVENOUS
  Administered 2015-03-01 (×5): 6 mg via INTRAVENOUS
  Administered 2015-03-02 – 2015-03-03 (×10): 4 mg via INTRAVENOUS
  Administered 2015-03-03: 6 mg via INTRAVENOUS
  Administered 2015-03-03 – 2015-03-04 (×5): 4 mg via INTRAVENOUS
  Filled 2015-02-25 (×2): qty 3
  Filled 2015-02-25 (×4): qty 2
  Filled 2015-02-25 (×2): qty 3
  Filled 2015-02-25 (×2): qty 2
  Filled 2015-02-25 (×4): qty 3
  Filled 2015-02-25 (×2): qty 2
  Filled 2015-02-25: qty 3
  Filled 2015-02-25 (×2): qty 2
  Filled 2015-02-25: qty 3
  Filled 2015-02-25: qty 2
  Filled 2015-02-25: qty 3
  Filled 2015-02-25 (×2): qty 2
  Filled 2015-02-25: qty 3
  Filled 2015-02-25: qty 2
  Filled 2015-02-25: qty 3
  Filled 2015-02-25 (×2): qty 2
  Filled 2015-02-25: qty 1
  Filled 2015-02-25 (×2): qty 3
  Filled 2015-02-25: qty 2
  Filled 2015-02-25 (×3): qty 3
  Filled 2015-02-25: qty 2
  Filled 2015-02-25: qty 1
  Filled 2015-02-25: qty 2
  Filled 2015-02-25: qty 3
  Filled 2015-02-25: qty 2
  Filled 2015-02-25 (×2): qty 3
  Filled 2015-02-25: qty 2
  Filled 2015-02-25: qty 3
  Filled 2015-02-25 (×4): qty 2

## 2015-02-25 MED ORDER — HEPARIN (PORCINE) IN NACL 100-0.45 UNIT/ML-% IJ SOLN
2200.0000 [IU]/h | INTRAMUSCULAR | Status: DC
  Filled 2015-02-25 (×2): qty 250

## 2015-02-25 MED ORDER — HEPARIN BOLUS VIA INFUSION
2000.0000 [IU] | Freq: Once | INTRAVENOUS | Status: AC
  Administered 2015-02-25: 2000 [IU] via INTRAVENOUS
  Filled 2015-02-25: qty 2000

## 2015-02-25 MED ORDER — IOHEXOL 300 MG/ML  SOLN
100.0000 mL | Freq: Once | INTRAMUSCULAR | Status: AC | PRN
  Administered 2015-02-25: 100 mL via INTRAVENOUS

## 2015-02-25 MED ORDER — HEPARIN (PORCINE) IN NACL 100-0.45 UNIT/ML-% IJ SOLN
1950.0000 [IU]/h | INTRAMUSCULAR | Status: DC
  Administered 2015-02-25 (×2): 1950 [IU]/h via INTRAVENOUS
  Filled 2015-02-25 (×3): qty 250

## 2015-02-25 NOTE — Progress Notes (Signed)
Pharmacy Consult Note - Heparin Follow Up  Labs: heparin level 0.21  A/P: heparin level subtherapeutic (goal 0.3-0.7) despite rebolus of 2000 units and increase in rate from 1600 units/hr to 17.5 units/hr. Per RN, no problems. Increase IV heparin rate to 1950 units/hr and will recheck heparin level 6 hours after rate changed.  Adrian Saran, PharmD, BCPS Pager [phone] 02/25/2015 4:14 PM

## 2015-02-25 NOTE — Progress Notes (Signed)
Pharmacy Consult Note - Heparin Follow Up  Labs: heparin level 0.24 units/ml  A/P: heparin level subtherapeutic (goal 0.3-0.7) despite increase in rate from 1750 units/hr to 1950 units/hr Per RN, no infusion or bleeding problems.  Increase IV heparin rate to 2200 units/hr and will recheck heparin level 6 hours after rate changed.  Dorrene German 02/25/2015 11:43 PM

## 2015-02-25 NOTE — Progress Notes (Signed)
  Subjective: She is no better.  Pain is better controlled with Morphine and toradol but no real change separately.  It hurts to walk and void now.    Objective: Vital signs in last 24 hours: Temp:  [97.4 F (36.3 C)-100.9 F (38.3 C)] 99.4 F (37.4 C) (01/12 0548) Pulse Rate:  [83-102] 102 (01/12 0548) Resp:  [18-22] 22 (01/12 0548) BP: (110-115)/(46-94) 110/68 mmHg (01/12 0548) SpO2:  [97 %-100 %] 98 % (01/12 0548) Last BM Date: 02/24/15 Nothing in I/O Diet:  Clears TM 100.9, afebrile, but tachy this AM  Prealbumin 5.8 WBC is still normal,and labs are pretty normal Intake/Output from previous day:   Intake/Output this shift:    General appearance: alert, cooperative, moderate distress and all from pain. GI: soft very tender, no improvement  Lab Results:   Recent Labs  02/24/15 0550 02/25/15 0545  WBC 8.5 8.1  HGB 10.1* 9.4*  HCT 31.4* 29.1*  PLT 344 341    BMET  Recent Labs  02/24/15 0550 02/25/15 0545  NA 138 139  K 3.5 3.6  CL 107 105  CO2 21* 24  GLUCOSE 105* 118*  BUN 7 6  CREATININE 0.69 0.71  CALCIUM 8.5* 8.3*   PT/INR No results for input(s): LABPROT, INR in the last 72 hours.   Recent Labs Lab 02/20/15 1950 02/21/15 0547 02/25/15 0545  AST 21 11* 11*  ALT 15 10* 9*  ALKPHOS 45 38 35*  BILITOT 1.0 1.0 0.8  PROT 7.2 6.1* 6.1*  ALBUMIN 3.8 3.1* 2.8*     Lipase     Component Value Date/Time   LIPASE 19 02/20/2015 1950     Studies/Results: No results found.  Medications: . diltiazem  180 mg Oral Daily  . heparin  2,000 Units Intravenous Once  . iohexol  25 mL Oral Q1 Hr x 2  . ketorolac  30 mg Intravenous 3 times per day  . metoprolol  50 mg Oral BID  . piperacillin-tazobactam (ZOSYN)  IV  3.375 g Intravenous 3 times per day  . ranitidine  150 mg Oral BID  . sodium chloride  3 mL Intravenous Q12H    Assessment/Plan Recurrent diverticulitis Hx of AF on Eliquis, last dose 02/19/15 Hypertension Obesity, BMI 47 Tobacco  use, quit 12/2014 Hx of anxiety/depression Chronic back pain Hx of migraine Hx of arthritis Antibiotics: Day 3 Flagyl, day 2 Cipro completed, day 4 Zosyn DVT: Heparin drip/SCD   Plan:  I will increase her MS, we have ordered a CT scan and she is thru the first cup of oral contrast.    LOS: 5 days    Crystal Brennan 02/25/2015

## 2015-02-25 NOTE — Progress Notes (Signed)
RT placed patient on CPAP. Patient home setting is 12 cmH2O. Sterile water was added to water chamber for humidification. Patient is tolerating well. RT will continue to monitor.

## 2015-02-25 NOTE — Progress Notes (Signed)
CT reviewed and discussed with IR.  Sigmoid inflammation is worse and extraluminal air pocket near inflamed colon in larger.  Also has a small amount of air above liver.  Tomorrow, IR will attempt percutaneous drain placement, if unsuccessful, will go to OR for laparoscopic washout and drain placement, possible Hartmann procedure.  Will need to have Heparin held for 6 hours prior to procedure(s).

## 2015-02-25 NOTE — Progress Notes (Signed)
ANTICOAGULATION CONSULT NOTE - Follow Up Consult  Pharmacy Consult for Heparin Indication: bridge therapy for Afib while off Apixaban  Allergies  Allergen Reactions  . Benadryl [Diphenhydramine Hcl] Hives and Itching  . Codeine Nausea And Vomiting  . Dilaudid [Hydromorphone Hcl]     "feels like she dying '  . Percocet [Oxycodone-Acetaminophen] Hives and Itching    Patient Measurements: Height: 5\' 2"  (157.5 cm) Weight: 256 lb (116.121 kg) IBW/kg (Calculated) : 50.1 HEPARIN DW (KG): 78.7  Vital Signs: Temp: 99.4 F (37.4 C) (01/12 0548) Temp Source: Oral (01/12 0548) BP: 110/68 mmHg (01/12 0548) Pulse Rate: 102 (01/12 0548)  Labs:  Recent Labs  02/23/15 0540 02/24/15 0550 02/25/15 0545  HGB 10.4* 10.1* 9.4*  HCT 32.2* 31.4* 29.1*  PLT 357 344 341  HEPARINUNFRC 0.35 0.43 0.17*  CREATININE 0.72 0.69 0.71    Estimated Creatinine Clearance: 100.5 mL/min (by C-G formula based on Cr of 0.71).   Medications:  Infusions:  . heparin 1,600 Units/hr (02/24/15 1151)  . 0.9 % sodium chloride with kcl 100 mL/hr at 02/24/15 [id number]  PTA: apixaban 5mg  BID LD 1/6@ 2100  Assessment: 50 yo F admitted with sigmoid diverticulitis and microperforation currently being treated conservatively with bowel rest and IV antibiotics.  She is on chronic Apixaban for hx Afib which was held on admission.  Pharmacy asked to manage heparin bridge while off apixaban.  02/25/2015:  Heparin level sub-therapeutic today (rate verified at bedside, no interruptions with IV site in which IV heparin        infusing)   Hg trending down.  No bleeding noted per patient but she aware of what to look for and to inform RN if needed  Goal of Therapy:  Heparin level 0.3-0.7 units/ml Monitor platelets by anticoagulation protocol: Yes   Plan:  1) Give 2000 units heparin bolus and then increase IV heparin infusion to 1750 units/hr.   3)  Recheck 6hr heparin level 2) Daily heparin level  Netta Cedars,  PharmD, BCPS Pager: [phone] 02/25/2015 8:29 AM

## 2015-02-25 NOTE — Progress Notes (Addendum)
Progress Note   EMYRSON Brennan [id number] DOB: [birth year] DOA: 02/20/2015 PCP: No PCP Per Patient   Brief Narrative:   50 year old female patient with history of A. fib on Eliquis, HTN, OSA on nightly CPAP and recurrent diverticulitis (3 prior episodes) presented to the [facility] ED on 02/20/15 with complaints of 2 day history of abdominal pain similar to prior episodes of diverticulitis. No nausea or vomiting reported. Possible constipation-last p.m. 02/20/15. CT abdomen in ED confirmed sigmoid diverticulitis and microperforation. Treating conservatively. Surgeons following.  Assessment/Plan:   Perforated sigmoid diverticulitis with pneumoperitoneum - Treating conservatively with bowel rest (NPO except meds), IV fluids, IV antibiotics (Zosyn) and pain management. - Elective colon resection preferred over colon resection now, which will certainly require a colostomy.  - If surgery planned, will need Cardiology preop clearance. - Continues on a clear liquid diet. - CT of the abdomen and pelvis done today for reevaluation, abscess larger, will likely need drain or surgery.  Hypokalemia - Replaced.  Anemia - Stable in the 9-10 mg/dL range.  Chronic diastolic CHF - Compensated. Monitor closely while being hydrated.   Proximal A. Fib - Anticoagulated on Eliquis at home- last dose 02/19/2015. Currently held in case urgent surgery is required.  - Continue IV heparin for now.  - CHA2DS2-VASc score: at least 3. - Continue home dose of Cardizem & Metoprolol. Hold HCTZ. Continue monitoring on telemetry.  Essential hypertension - Controlled. Transient hypotension in ED which has resolved.   Morbid obesity/Body mass index is 46.81 kg/(m^2)/OSA - Continue nightly CPAP.  Tobacco abuse - States that she quit in November 2016 and denies alcohol use.  History of anxiety and depression   DVT Prophylaxis - Currently on a heparin drip.   Family Communication/Anticipated D/C  date and plan/Code Status   Family Communication: Daughter at the bedside. Disposition Plan: Home when diverticulitis appears to be resolving. Anticipated D/C date:   2-3 days. Code Status: Full code.   IV Access:    Peripheral IV   Procedures and diagnostic studies:   Ct Abdomen Pelvis W Contrast  02/20/2015  CLINICAL DATA:  Abdominal pain. Patient believes this is diverticulitis. History of diverticulitis and hypertension as well as sickle cell trait. EXAM: CT ABDOMEN AND PELVIS WITH CONTRAST TECHNIQUE: Multidetector CT imaging of the abdomen and pelvis was performed using the standard protocol following bolus administration of intravenous contrast. CONTRAST:  55mL OMNIPAQUE IOHEXOL 300 MG/ML SOLN, 133mL OMNIPAQUE IOHEXOL 300 MG/ML SOLN COMPARISON:  09/30/2014 FINDINGS: Lung bases: Dependent subsegmental atelectasis. No convincing pneumonia or edema. Heart top-normal in size. Several low-density liver lesions are noted, which do not enhance and are stable from the prior CT consistent with cysts. Largest lies in the posterior segment of the right lobe measuring 1 cm. Liver otherwise unremarkable. Gallbladder surgically absent.  No bile duct dilation. Spleen, pancreas, adrenal glands:  Unremarkable. Kidneys, ureters, bladder:  Normal. Uterus and adnexa:  Unremarkable. Lymph nodes:  No adenopathy. Ascites:  None. Gastrointestinal: Sigmoid colon is redundant. There multiple colonic diverticula. Significant inflammatory changes surround the mid to distal sigmoid colon, with the inflammatory changes centered on to portions of the sigmoid colon, both lying in the right lower quadrant. There is some extraluminal air, but no formed abscess. Other diverticula seen along the remainder of colon with no other inflammatory changes. Stomach and small bowel are unremarkable. Musculoskeletal:  Unremarkable. IMPRESSION: 1. Sigmoid diverticulitis. There appears to be 2 sections of the mid sigmoid colon there are  inflamed,  both projecting in the right lower quadrant. Areas associated extraluminal air reflecting diverticular perforation, but no evidence of an abscess. 2. No other acute findings. 3. Multiple other noninflamed colonic diverticula. 4. Stable small hepatic cysts. Electronically Signed   By: Lajean Manes M.D.   On: 02/20/2015 21:53   Ct Abdomen Pelvis W Contrast  02/25/2015  CLINICAL DATA:  Known diverticulitis, difficulty walking and voiding EXAM: CT ABDOMEN AND PELVIS WITH CONTRAST TECHNIQUE: Multidetector CT imaging of the abdomen and pelvis was performed using the standard protocol following bolus administration of intravenous contrast. CONTRAST:  13mL OMNIPAQUE IOHEXOL 300 MG/ML  SOLN COMPARISON:  02/20/2015 FINDINGS: Lower chest: Small right pleural effusion. Bibasilar airspace disease likely reflecting atelectasis. Hepatobiliary: Normal liver.  Prior cholecystectomy. Pancreas: Normal. Spleen: Normal. Adrenals/Urinary Tract: Normal adrenal glands. Normal kidneys. No obstructive uropathy. Normal bladder. Stomach/Bowel: Sigmoid diverticulosis with bowel wall thickening and severe surrounding inflammatory changes most consistent with acute diverticulitis. The previously seen small amount of extraluminal air adjacent to the sigmoid colon is significantly larger measuring approximately 4.9 x 5.6 cm. Small volume pneumoperitoneum new compared with the prior examination. Vascular/Lymphatic: Normal caliber abdominal aorta. No lymphadenopathy. Reproductive: Normal uterus.  No adnexal mass. Musculoskeletal: No lytic or sclerotic osseous lesion. No acute osseous abnormality. IMPRESSION: 1. Perforated sigmoid diverticulitis which has worsened compared with 02/20/2015. There is now small volume pneumoperitoneum. Previously seen small amount of contained extraluminal air adjacent to the sigmoid colon is significantly larger measuring approximately 4.9 x 5.6 cm. 2. Small right pleural effusion. Bibasilar airspace  disease likely reflecting atelectasis. These results were called by telephone at the time of interpretation on 02/25/2015 at 1:52 pm to Dr. Barkley Bruns, who verbally acknowledged these results. Electronically Signed   By: Kathreen Devoid   On: 02/25/2015 13:55    Medical Consultants:    Surgery: Alphonsa Overall, MD  Anti-Infectives:    Cipro 02/20/15  > 02/22/15  Flagyl 02/20/15  > 02/22/15  Zosyn 02/22/15 >  Subjective:   Crystal Brennan is having ongoing crampy abdominal pain.  Has had some nausea but no frank vomiting.  Objective:    Filed Vitals:   02/24/15 1349 02/24/15 1738 02/24/15 2228 02/25/15 0548  BP: 110/94 115/46 112/63 110/68  Pulse: 83 99 95 102  Temp: 97.8 F (36.6 C) 97.4 F (36.3 C) 100.9 F (38.3 C) 99.4 F (37.4 C)  TempSrc: Axillary Oral Oral Oral  Resp: 18 18 20 22   Height:      Weight:      SpO2: 100% 98% 97% 98%   No intake or output data in the 24 hours ending 02/25/15 0735 Filed Weights   02/20/15 2359  Weight: 116.121 kg (256 lb)    Exam: Gen:  Uncomfortable Cardiovascular: Tachy, RRR Respiratory:  Lungs CTAB Gastrointestinal:  Abdomen soft, tender, +BS Extremities:  LLE swelling   Data Reviewed:    Labs: Basic Metabolic Panel:  Recent Labs Lab 02/20/15 1950 02/21/15 0547 02/22/15 0725 02/23/15 0540 02/24/15 0550 02/25/15 0545  NA 142 139 138 139 138 139  K 2.9* 3.0* 3.4* 3.3* 3.5 3.6  CL 102 106 107 107 107 105  CO2 29 27 22  21* 21* 24  GLUCOSE 135* 125* 103* 110* 105* 118*  BUN 7 6 6 7 7 6   CREATININE 0.85 0.74 0.66 0.72 0.69 0.71  CALCIUM 8.7* 7.8* 7.9* 8.5* 8.5* 8.3*  MG 1.7 1.7  --   --  2.0  --   PHOS  --  2.7  --   --   --   --  GFR Estimated Creatinine Clearance: 100.5 mL/min (by C-G formula based on Cr of 0.71). Liver Function Tests:  Recent Labs Lab 02/20/15 1950 02/21/15 0547 02/25/15 0545  AST 21 11* 11*  ALT 15 10* 9*  ALKPHOS 45 38 35*  BILITOT 1.0 1.0 0.8  PROT 7.2 6.1* 6.1*  ALBUMIN 3.8 3.1* 2.8*     Recent Labs Lab 02/20/15 1950  LIPASE 19   No results for input(s): AMMONIA in the last 168 hours. Coagulation profile No results for input(s): INR, PROTIME in the last 168 hours.  CBC:  Recent Labs Lab 02/21/15 0547 02/22/15 0725 02/23/15 0540 02/24/15 0550 02/25/15 0545  WBC 9.8 9.6 9.7 8.5 8.1  HGB 9.5* 9.7* 10.4* 10.1* 9.4*  HCT 28.8* 30.0* 32.2* 31.4* 29.1*  MCV 82.8 83.6 82.6 81.1 82.4  PLT 292 330 357 344 341   Cardiac Enzymes: No results for input(s): CKTOTAL, CKMB, CKMBINDEX, TROPONINI in the last 168 hours. BNP (last 3 results) No results for input(s): PROBNP in the last 8760 hours. CBG: No results for input(s): GLUCAP in the last 168 hours. D-Dimer: No results for input(s): DDIMER in the last 72 hours. Hgb A1c: No results for input(s): HGBA1C in the last 72 hours. Lipid Profile: No results for input(s): CHOL, HDL, LDLCALC, TRIG, CHOLHDL, LDLDIRECT in the last 72 hours. Thyroid function studies: No results for input(s): TSH, T4TOTAL, T3FREE, THYROIDAB in the last 72 hours.  Invalid input(s): FREET3 Anemia work up: No results for input(s): VITAMINB12, FOLATE, FERRITIN, TIBC, IRON, RETICCTPCT in the last 72 hours. Sepsis Labs:  Recent Labs Lab 02/20/15 2318 02/21/15 0547 02/22/15 0725 02/23/15 0540 02/24/15 0550 02/25/15 0545  WBC  --  9.8 9.6 9.7 8.5 8.1  LATICACIDVEN 0.9 0.7  --   --   --   --    Microbiology No results found for this or any previous visit (from the past 240 hour(s)).   Medications:   . diltiazem  180 mg Oral Daily  . ketorolac  30 mg Intravenous 3 times per day  . metoprolol  50 mg Oral BID  . piperacillin-tazobactam (ZOSYN)  IV  3.375 g Intravenous 3 times per day  . ranitidine  150 mg Oral BID  . sodium chloride  3 mL Intravenous Q12H   Continuous Infusions: . heparin 1,600 Units/hr (02/24/15 1151)  . 0.9 % sodium chloride with kcl 100 mL/hr at 02/24/15 0954    Time spent: 25 minutes.   LOS: 5 days    [facility] Pager [phone]. If unable to reach me by pager, please call my cell phone at [phone].  *Please refer to [email or web address], password [email or web address] to get updated schedule on who will round on this patient, as hospitalists switch teams weekly. If 7PM-7AM, please contact night-coverage at [email or web address], password [email or web address] for any overnight needs.  02/25/2015, 7:35 AM

## 2015-02-25 NOTE — Progress Notes (Signed)
Pt wants CPAP around midnight.

## 2015-02-25 NOTE — Consult Note (Signed)
Chief Complaint: Patient was seen in consultation today for CT guided drainage of pelvic air/fluid collection Chief Complaint  Patient presents with  . Diverticulitis    Referring Physician(s): CCS  History of Present Illness: Crystal Brennan is a 50 y.o. female with history of atrial fibrillation, on eliquis (LD 02/19/15) and now IV heparin, hypertension, CHF, morbid obesity, prior tobacco abuse, chronic back pain, obstructive sleep apnea and recurrent diverticulitis who was recently admitted with abdominal pain. Subsequent imaging revealed perforated sigmoid diverticulitis which has worsened since scan of 02/20/15. There is now small volume of pneumoperitoneum and increasing amount of extraluminal air/fluid collection adjacent to the sigmoid colon. Request now received for CT guided drainage of the above-mentioned air/fluid collection. Additional past medical history as below.  Past Medical History  Diagnosis Date  . Chronic back pain     "upper" (01/01/2014)  . Hypertension   . Depression   . Anxiety   . Anemia   . Menometrorrhagia   . Insomnia   . Diverticulitis   . Sickle cell trait ([place])   . Cardiomegaly     a. 2011 Echo: EF 65-70%, Gr 1 DD, nl wall motion.  . Morbid obesity ([place])   . Tobacco abuse   . Palpitations     a. Date back to 08/2013.  . Diverticulitis     a. 09/2013  . Family history of adverse reaction to anesthesia     "Mom gets so sick off of it"  . Heart murmur   . Chronic bronchitis ([place])     "get it ~ q yr" (01/01/2014)  . GERD (gastroesophageal reflux disease)   . Headache     "monthly" (01/01/2014)  . Migraine     "monthly" (01/01/2014)  . Arthritis     "qwhere"    Past Surgical History  Procedure Laterality Date  . Cholecystectomy  2009  . Tubal ligation  198  . Cesarean section  1983; 1986; 1988    Allergies: Benadryl; Codeine; Dilaudid; and Percocet  Medications: Prior to Admission medications   Medication Sig Start Date End Date  Taking? Authorizing Provider  apixaban (ELIQUIS) 5 MG TABS tablet Take 1 tablet (5 mg total) by mouth 2 (two) times daily. 01/26/15  Yes Lelon Perla, MD  Aspirin Effervescent (ALKA-SELTZER PO) Take 2 tablets by mouth 2 (two) times daily as needed (stomach pains).   Yes Historical Provider, MD  Chlorpheniramine Maleate (CHLOR-TRIMETON PO) Take 2 tablets by mouth daily as needed (allergies).   Yes Historical Provider, MD  diltiazem (CARDIZEM CD) 180 MG 24 hr capsule Take 1 capsule (180 mg total) by mouth daily. 01/26/15  Yes Lelon Perla, MD  hydrochlorothiazide (HYDRODIURIL) 25 MG tablet Take 1 tablet (25 mg total) by mouth daily. 01/26/15  Yes Lelon Perla, MD  ibuprofen (ADVIL,MOTRIN) 200 MG tablet Take 800 mg by mouth every 6 (six) hours as needed for moderate pain or cramping.   Yes Historical Provider, MD  metoprolol (LOPRESSOR) 50 MG tablet Take 1 tablet (50 mg total) by mouth 2 (two) times daily. 01/26/15  Yes Lelon Perla, MD  ketorolac (TORADOL) 10 MG tablet Take 1 tablet (10 mg total) by mouth every 6 (six) hours as needed. 04/18/14   Larene Pickett, PA-C     Family History  Problem Relation Age of Onset  . Heart attack Mother     ? in her 17's - never sought treatment  . Pulmonary embolism Mother   . Other  Father     ? hx  . Lupus Sister   . Heart failure Sister     Social History   Social History  . Marital Status: Married    Spouse Name: N/A  . Number of Children: N/A  . Years of Education: N/A   Social History Main Topics  . Smoking status: Former Smoker -- 0.33 packs/day for 30 years    Types: Cigarettes    Quit date: 12/31/2013  . Smokeless tobacco: None     Comment: pt smoke electronic cigarette  . Alcohol Use: 1.8 oz/week    0 Standard drinks or equivalent, 3 Glasses of wine per week  . Drug Use: No  . Sexual Activity: Not Asked   Other Topics Concern  . None   Social History Narrative   Lives in [place] with fiance.  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  Walks regularly.  Trying to lose weight.      Review of Systems  Constitutional: Negative for fever and chills.  Respiratory: Negative for cough and shortness of breath.   Cardiovascular: Negative for chest pain.  Gastrointestinal: Positive for abdominal pain and constipation. Negative for nausea, vomiting and blood in stool.  Genitourinary: Negative for dysuria and hematuria.  Musculoskeletal: Positive for back pain.  Neurological: Negative for headaches.    Vital Signs: BP 100/52 mmHg  Pulse 97  Temp(Src) 98.9 F (37.2 C) (Oral)  Resp 20  Ht 5\' 2"  (1.575 m)  Wt 256 lb (116.121 kg)  BMI 46.81 kg/m2  SpO2 95%  LMP 02/22/2015  Physical Exam patient awake, alert. Chest has slightly diminished breath sounds at bases; heart with regular rate and rhythm. Abdomen obese, positive bowel sounds, tender lower pelvic regions; extremities with full range of motion  Mallampati Score:     Imaging: Dg Abd 1 View  02/22/2015  CLINICAL DATA:  Sigmoid diverticulitis.  Abdominal pain. EXAM: ABDOMEN - 1 VIEW COMPARISON:  CT on 02/20/2015 FINDINGS: Oral contrast from previous CT is now seen throughout colon. There is no evidence bowel obstruction . Diverticulosis is seen involving the right colon. Right upper quadrant surgical clips are seen from prior cholecystectomy. IMPRESSION: Oral contrast seen within colon. Diverticulosis noted. No evidence of bowel obstruction. Electronically Signed   By: Earle Gell M.D.   On: 02/22/2015 08:36   Ct Abdomen Pelvis W Contrast  02/25/2015  CLINICAL DATA:  Known diverticulitis, difficulty walking and voiding EXAM: CT ABDOMEN AND PELVIS WITH CONTRAST TECHNIQUE: Multidetector CT imaging of the abdomen and pelvis was performed using the standard protocol following bolus administration of intravenous contrast. CONTRAST:  173mL OMNIPAQUE IOHEXOL 300 MG/ML  SOLN COMPARISON:  02/20/2015 FINDINGS: Lower chest: Small right pleural effusion.  Bibasilar airspace disease likely reflecting atelectasis. Hepatobiliary: Normal liver.  Prior cholecystectomy. Pancreas: Normal. Spleen: Normal. Adrenals/Urinary Tract: Normal adrenal glands. Normal kidneys. No obstructive uropathy. Normal bladder. Stomach/Bowel: Sigmoid diverticulosis with bowel wall thickening and severe surrounding inflammatory changes most consistent with acute diverticulitis. The previously seen small amount of extraluminal air adjacent to the sigmoid colon is significantly larger measuring approximately 4.9 x 5.6 cm. Small volume pneumoperitoneum new compared with the prior examination. Vascular/Lymphatic: Normal caliber abdominal aorta. No lymphadenopathy. Reproductive: Normal uterus.  No adnexal mass. Musculoskeletal: No lytic or sclerotic osseous lesion. No acute osseous abnormality. IMPRESSION: 1. Perforated sigmoid diverticulitis which has worsened compared with 02/20/2015. There is now small volume pneumoperitoneum. Previously seen small amount of contained extraluminal air adjacent to the sigmoid colon is  significantly larger measuring approximately 4.9 x 5.6 cm. 2. Small right pleural effusion. Bibasilar airspace disease likely reflecting atelectasis. These results were called by telephone at the time of interpretation on 02/25/2015 at 1:52 pm to Dr. Barkley Bruns, who verbally acknowledged these results. Electronically Signed   By: Kathreen Devoid   On: 02/25/2015 13:55   Ct Abdomen Pelvis W Contrast  02/20/2015  CLINICAL DATA:  Abdominal pain. Patient believes this is diverticulitis. History of diverticulitis and hypertension as well as sickle cell trait. EXAM: CT ABDOMEN AND PELVIS WITH CONTRAST TECHNIQUE: Multidetector CT imaging of the abdomen and pelvis was performed using the standard protocol following bolus administration of intravenous contrast. CONTRAST:  37mL OMNIPAQUE IOHEXOL 300 MG/ML SOLN, 163mL OMNIPAQUE IOHEXOL 300 MG/ML SOLN COMPARISON:  09/30/2014 FINDINGS: Lung bases:  Dependent subsegmental atelectasis. No convincing pneumonia or edema. Heart top-normal in size. Several low-density liver lesions are noted, which do not enhance and are stable from the prior CT consistent with cysts. Largest lies in the posterior segment of the right lobe measuring 1 cm. Liver otherwise unremarkable. Gallbladder surgically absent.  No bile duct dilation. Spleen, pancreas, adrenal glands:  Unremarkable. Kidneys, ureters, bladder:  Normal. Uterus and adnexa:  Unremarkable. Lymph nodes:  No adenopathy. Ascites:  None. Gastrointestinal: Sigmoid colon is redundant. There multiple colonic diverticula. Significant inflammatory changes surround the mid to distal sigmoid colon, with the inflammatory changes centered on to portions of the sigmoid colon, both lying in the right lower quadrant. There is some extraluminal air, but no formed abscess. Other diverticula seen along the remainder of colon with no other inflammatory changes. Stomach and small bowel are unremarkable. Musculoskeletal:  Unremarkable. IMPRESSION: 1. Sigmoid diverticulitis. There appears to be 2 sections of the mid sigmoid colon there are inflamed, both projecting in the right lower quadrant. Areas associated extraluminal air reflecting diverticular perforation, but no evidence of an abscess. 2. No other acute findings. 3. Multiple other noninflamed colonic diverticula. 4. Stable small hepatic cysts. Electronically Signed   By: Lajean Manes M.D.   On: 02/20/2015 21:53    Labs:  CBC:  Recent Labs  02/22/15 0725 02/23/15 0540 02/24/15 0550 02/25/15 0545  WBC 9.6 9.7 8.5 8.1  HGB 9.7* 10.4* 10.1* 9.4*  HCT 30.0* 32.2* 31.4* 29.1*  PLT 330 357 344 341    COAGS:  Recent Labs  02/21/15 1600 02/21/15 2322  APTT 34 55*    BMP:  Recent Labs  02/22/15 0725 02/23/15 0540 02/24/15 0550 02/25/15 0545  NA 138 139 138 139  K 3.4* 3.3* 3.5 3.6  CL 107 107 107 105  CO2 22 21* 21* 24  GLUCOSE 103* 110* 105* 118*    BUN 6 7 7 6   CALCIUM 7.9* 8.5* 8.5* 8.3*  CREATININE 0.66 0.72 0.69 0.71  GFRNONAA >60 >60 >60 >60  GFRAA >60 >60 >60 >60    LIVER FUNCTION TESTS:  Recent Labs  09/30/14 1301 02/20/15 1950 02/21/15 0547 02/25/15 0545  BILITOT 0.5 1.0 1.0 0.8  AST 23 21 11* 11*  ALT 12* 15 10* 9*  ALKPHOS 50 45 38 35*  PROT 6.7 7.2 6.1* 6.1*  ALBUMIN 3.4* 3.8 3.1* 2.8*    TUMOR MARKERS: No results for input(s): AFPTM, CEA, CA199, CHROMGRNA in the last 8760 hours.  Assessment and Plan: Patient with history of diverticular disease and recent CT revealing perforated sigmoid diverticulitis with small volume pneumoperitoneum as well as an enlarging extraluminal air/fluid collection adjacent to sigmoid colon. Request now received from surgery for  CT-guided drainage of the above-mentioned fluid collection. Imaging studies have been reviewed by Dr. Kathlene Cote. Details/risks of procedure, including but not limited to, internal bleeding, infection, injury to adjacent organs, inability to place drainage catheter and need for possible surgery d/w patient and family with their understanding and consent. Procedure is tentatively planned for 1/13. IV heparin will be stopped at 0800  1/13.   Thank you for this interesting consult.  I greatly enjoyed meeting [employer] and look forward to participating in their care.  A copy of this report was sent to the requesting provider on this date.  Electronically Signed: D. Rowe Robert 02/25/2015, 4:54 PM   I spent a total of 20 minutes in face to face in clinical consultation, greater than 50% of which was counseling/coordinating care for CT-guided pelvic air-fluid collection drainage

## 2015-02-26 ENCOUNTER — Encounter (HOSPITAL_COMMUNITY): Payer: Self-pay | Admitting: Radiology

## 2015-02-26 ENCOUNTER — Inpatient Hospital Stay (HOSPITAL_COMMUNITY): Payer: Commercial Managed Care - PPO

## 2015-02-26 LAB — BASIC METABOLIC PANEL
ANION GAP: 12 (ref 5–15)
BUN: 6 mg/dL (ref 6–20)
CHLORIDE: 108 mmol/L (ref 101–111)
CO2: 17 mmol/L — ABNORMAL LOW (ref 22–32)
Calcium: 8.1 mg/dL — ABNORMAL LOW (ref 8.9–10.3)
Creatinine, Ser: 0.62 mg/dL (ref 0.44–1.00)
GFR calc Af Amer: >60 mL/min (ref >60)
Glucose, Bld: 102 mg/dL — ABNORMAL HIGH (ref 65–99)
Potassium: 3.6 mmol/L (ref 3.5–5.1)
SODIUM: 137 mmol/L (ref 135–145)

## 2015-02-26 LAB — PROTIME-INR
INR: 1.27 (ref 0.00–1.49)
Prothrombin Time: 16 seconds — ABNORMAL HIGH (ref 11.6–15.2)

## 2015-02-26 LAB — CBC
HEMATOCRIT: 22.9 % — AB (ref 36.0–46.0)
HEMOGLOBIN: 7.6 g/dL — AB (ref 12.0–15.0)
MCH: 26.6 pg (ref 26.0–34.0)
MCHC: 33.2 g/dL (ref 30.0–36.0)
MCV: 80.1 fL (ref 78.0–100.0)
Platelets: 368 10*3/uL (ref 150–400)
RBC: 2.86 MIL/uL — AB (ref 3.87–5.11)
RDW: 14.9 % (ref 11.5–15.5)
WBC: 9.9 10*3/uL (ref 4.0–10.5)

## 2015-02-26 LAB — SURGICAL PCR SCREEN
MRSA, PCR: NEGATIVE
Staphylococcus aureus: POSITIVE — AB

## 2015-02-26 LAB — HEPARIN LEVEL (UNFRACTIONATED): HEPARIN UNFRACTIONATED: 0.21 [IU]/mL — AB (ref 0.30–0.70)

## 2015-02-26 MED ORDER — FENTANYL CITRATE (PF) 100 MCG/2ML IJ SOLN
INTRAMUSCULAR | Status: AC
  Filled 2015-02-26: qty 4

## 2015-02-26 MED ORDER — HEPARIN (PORCINE) IN NACL 100-0.45 UNIT/ML-% IJ SOLN
1950.0000 [IU]/h | INTRAMUSCULAR | Status: AC
Start: 2015-02-26 — End: 2015-03-05
  Administered 2015-02-26 – 2015-03-03 (×11): 2200 [IU]/h via INTRAVENOUS
  Administered 2015-03-04 (×2): 1950 [IU]/h via INTRAVENOUS
  Filled 2015-02-26 (×19): qty 250

## 2015-02-26 MED ORDER — MIDAZOLAM HCL 2 MG/2ML IJ SOLN
INTRAMUSCULAR | Status: AC | PRN
  Administered 2015-02-26 (×2): 0.5 mg via INTRAVENOUS
  Administered 2015-02-26: 1 mg via INTRAVENOUS

## 2015-02-26 MED ORDER — MIDAZOLAM HCL 2 MG/2ML IJ SOLN
INTRAMUSCULAR | Status: AC
  Filled 2015-02-26: qty 6

## 2015-02-26 MED ORDER — FENTANYL CITRATE (PF) 100 MCG/2ML IJ SOLN
INTRAMUSCULAR | Status: AC | PRN
  Administered 2015-02-26: 25 ug via INTRAVENOUS
  Administered 2015-02-26: 50 ug via INTRAVENOUS
  Administered 2015-02-26: 25 ug via INTRAVENOUS

## 2015-02-26 NOTE — Progress Notes (Signed)
Subjective: SHE is pretty upset with the MRSA  Revelation by the PM crew.  We explained it's nothing to be concerned over.  Her stomach is a little better, but still has pain in that RLQ.  Objective: Vital signs in last 24 hours: Temp:  [98.9 F (37.2 C)-99.6 F (37.6 C)] 99.4 F (37.4 C) (01/13 [id number]) Pulse Rate:  [97-112] 112 (01/13 0632) Resp:  [20-22] 20 (01/13 [id number]) BP: (100-143)/(52-71) 128/66 mmHg (01/13 0632) SpO2:  [93 %-95 %] 93 % (01/13 [id number]) Last BM Date: 02/25/15  Intake/Output from previous day: 01/12 0701 - 01/13 0700 In: 1921.3 [P.O.:480; I.V.:1391.3; IV Piggyback:50] Out: -  Intake/Output this shift:    General appearance: alert and cooperative GI: soft perhaps less tender this AM.  still complains of pain RLQ  Lab Results:   Recent Labs  02/25/15 0545 02/26/15 0700  WBC 8.1 9.9  HGB 9.4* 7.6*  HCT 29.1* 22.9*  PLT 341 368    BMET  Recent Labs  02/25/15 0545 02/26/15 0700  NA 139 137  K 3.6 3.6  CL 105 108  CO2 24 17*  GLUCOSE 118* 102*  BUN 6 6  CREATININE 0.71 0.62  CALCIUM 8.3* 8.1*   PT/INR  Recent Labs  02/26/15 0700  LABPROT 16.0*  INR 1.27     Recent Labs Lab 02/20/15 1950 02/21/15 0547 02/25/15 0545  AST 21 11* 11*  ALT 15 10* 9*  ALKPHOS 45 38 35*  BILITOT 1.0 1.0 0.8  PROT 7.2 6.1* 6.1*  ALBUMIN 3.8 3.1* 2.8*     Lipase     Component Value Date/Time   LIPASE 19 02/20/2015 1950     Studies/Results: Ct Abdomen Pelvis W Contrast  02/25/2015  CLINICAL DATA:  Known diverticulitis, difficulty walking and voiding EXAM: CT ABDOMEN AND PELVIS WITH CONTRAST TECHNIQUE: Multidetector CT imaging of the abdomen and pelvis was performed using the standard protocol following bolus administration of intravenous contrast. CONTRAST:  132mL OMNIPAQUE IOHEXOL 300 MG/ML  SOLN COMPARISON:  02/20/2015 FINDINGS: Lower chest: Small right pleural effusion. Bibasilar airspace disease likely reflecting atelectasis. Hepatobiliary:  Normal liver.  Prior cholecystectomy. Pancreas: Normal. Spleen: Normal. Adrenals/Urinary Tract: Normal adrenal glands. Normal kidneys. No obstructive uropathy. Normal bladder. Stomach/Bowel: Sigmoid diverticulosis with bowel wall thickening and severe surrounding inflammatory changes most consistent with acute diverticulitis. The previously seen small amount of extraluminal air adjacent to the sigmoid colon is significantly larger measuring approximately 4.9 x 5.6 cm. Small volume pneumoperitoneum new compared with the prior examination. Vascular/Lymphatic: Normal caliber abdominal aorta. No lymphadenopathy. Reproductive: Normal uterus.  No adnexal mass. Musculoskeletal: No lytic or sclerotic osseous lesion. No acute osseous abnormality. IMPRESSION: 1. Perforated sigmoid diverticulitis which has worsened compared with 02/20/2015. There is now small volume pneumoperitoneum. Previously seen small amount of contained extraluminal air adjacent to the sigmoid colon is significantly larger measuring approximately 4.9 x 5.6 cm. 2. Small right pleural effusion. Bibasilar airspace disease likely reflecting atelectasis. These results were called by telephone at the time of interpretation on 02/25/2015 at 1:52 pm to Dr. Barkley Bruns, who verbally acknowledged these results. Electronically Signed   By: Kathreen Devoid   On: 02/25/2015 13:55    Medications: . diltiazem  180 mg Oral Daily  . ertapenem  1 g Intravenous Q24H  . metoprolol  50 mg Oral BID  . ranitidine  150 mg Oral BID  . sodium chloride  3 mL Intravenous Q12H   . 0.9 % sodium chloride with kcl 100 mL/hr at 02/25/15 2309  Heparin drip stopped for possible procedure this PM.  Assessment/Plan Recurrent diverticulitis Hx of AF on Eliquis, last dose 02/19/15 Hypertension Obesity, BMI 47 Tobacco use, quit 12/2014 Hx of anxiety/depression Chronic back pain Hx of migraine Hx of arthritis Antibiotics: Day 3 Flagyl, day 2 Cipro completed, day 4 Zosyn, STARTED  ON iNVAZ DVT: Heparin drip/SCD   Plan:  IR and Dr. Zella Richer are considering options.  Continue Invanz.      LOS: 6 days    Jocob Dambach 02/26/2015

## 2015-02-26 NOTE — Progress Notes (Signed)
ANTICOAGULATION CONSULT NOTE - Follow Up Consult  Pharmacy Consult for Heparin Indication: bridge therapy for Afib while off Apixaban  Allergies  Allergen Reactions  . Benadryl [Diphenhydramine Hcl] Hives and Itching  . Codeine Nausea And Vomiting  . Dilaudid [Hydromorphone Hcl]     "feels like she dying '  . Percocet [Oxycodone-Acetaminophen] Hives and Itching    Patient Measurements: Height: 5\' 2"  (157.5 cm) Weight: 256 lb (116.121 kg) IBW/kg (Calculated) : 50.1 HEPARIN DW (KG): 78.7  Vital Signs: Temp: 99.4 F (37.4 C) (01/13 0632) Temp Source: Oral (01/13 [id number]) BP: 128/66 mmHg (01/13 [id number]) Pulse Rate: 112 (01/13 0632)  Labs:  Recent Labs  02/24/15 0550 02/25/15 0545 02/25/15 1505 02/25/15 2245 02/26/15 0700  HGB 10.1* 9.4*  --   --  7.6*  HCT 31.4* 29.1*  --   --  22.9*  PLT 344 341  --   --  368  LABPROT  --   --   --   --  16.0*  INR  --   --   --   --  1.27  HEPARINUNFRC 0.43 0.17* 0.21* 0.24* 0.21*  CREATININE 0.69 0.71  --   --  0.62    Estimated Creatinine Clearance: 100.5 mL/min (by C-G formula based on Cr of 0.62).   Medications:  Infusions:  . 0.9 % sodium chloride with kcl 100 mL/hr at 02/25/15 2309  PTA: apixaban 5mg  BID LD 1/6@ 2100  Assessment: 50 yo F admitted with sigmoid diverticulitis and microperforation currently being treated conservatively with bowel rest and IV antibiotics.  She is on chronic Apixaban for hx Afib which was held on admission.  Pharmacy asked to manage heparin bridge while off apixaban.  02/26/2015:  Heparin level sub-therapeutic today  Hg trending down.  No bleeding noted per patient but she aware of what to look for and to inform RN if needed  Goal of Therapy:  Heparin level 0.3-0.7 units/ml Monitor platelets by anticoagulation protocol: Yes   Plan:  1) Heparin infusion was stopped at 8am for surgical procedure this afternoon 2) F/U for post-op anticoagulation plans  Netta Cedars, PharmD,  BCPS Pager: [phone] 02/26/2015 8:43 AM

## 2015-02-26 NOTE — Progress Notes (Signed)
Received report from IR Nurse,Pt arrived unit, alert and oriented, able to communicate needs. Will continue with current plan of care.

## 2015-02-26 NOTE — Progress Notes (Addendum)
Progress Note   Crystal Brennan [id number] DOB: 1966-01-25 DOA: 02/20/2015 PCP: No PCP Per Patient   Brief Narrative:   50 year old female patient with history of A. fib on Eliquis, HTN, OSA on nightly CPAP and recurrent diverticulitis (3 prior episodes) presented to the [facility] ED on 02/20/15 with complaints of 2 day history of abdominal pain similar to prior episodes of diverticulitis. No nausea or vomiting reported. Possible constipation-last p.m. 02/20/15. CT abdomen in ED confirmed sigmoid diverticulitis and microperforation. Treating conservatively. Surgeons following.  Assessment/Plan:   Perforated sigmoid diverticulitis with pneumoperitoneum - Treating conservatively with bowel rest (NPO except meds), IV fluids,  - Zosyn changed to Iv Invanz yesterday, Repeat CT with worsening perf, clinically stable -per CCS -plan for drain per IR today  Hypokalemia - Replaced.  Anemia - Hb down to 7.6 today, no overt bleeding, on IV hepARIN -monitor, transfuse if trends down further -no hemoperitoneum noted on scan yesterday afternoon  Chronic diastolic CHF - Compensated. Monitor closely while being hydrated.   Proximal A. Fib - Anticoagulated on Eliquis at home- last dose 02/19/2015. Currently held - Continue IV heparin , held for drain this am - CHA2DS2-VASc score: at least 3. - Continue home dose of Cardizem & Metoprolol.   Essential hypertension - Controlled. Transient hypotension in ED which has resolved.   Morbid obesity/Body mass index is 46.81 kg/(m^2)/OSA - Continue nightly CPAP.  Tobacco abuse - quit in 11/16  History of anxiety and depression  H/o Pos MRSA nasal swab,  -repeat pending, upset abt being swabbed last pm -Rx with mupirocin if +   DVT Prophylaxis - Currently on a heparin drip.   Family Communication/Anticipated D/C date and plan/Code Status   Family Communication: spouse at bedside. Disposition Plan: Home when diverticulitis/perf  stabilized Code Status: Full code.   IV Access:    Peripheral IV   Procedures and diagnostic studies:   Ct Abdomen Pelvis W Contrast  02/20/2015  CLINICAL DATA:  Abdominal pain. Patient believes this is diverticulitis. History of diverticulitis and hypertension as well as sickle cell trait. EXAM: CT ABDOMEN AND PELVIS WITH CONTRAST TECHNIQUE: Multidetector CT imaging of the abdomen and pelvis was performed using the standard protocol following bolus administration of intravenous contrast. CONTRAST:  6mL OMNIPAQUE IOHEXOL 300 MG/ML SOLN, 18mL OMNIPAQUE IOHEXOL 300 MG/ML SOLN COMPARISON:  09/30/2014 FINDINGS: Lung bases: Dependent subsegmental atelectasis. No convincing pneumonia or edema. Heart top-normal in size. Several low-density liver lesions are noted, which do not enhance and are stable from the prior CT consistent with cysts. Largest lies in the posterior segment of the right lobe measuring 1 cm. Liver otherwise unremarkable. Gallbladder surgically absent.  No bile duct dilation. Spleen, pancreas, adrenal glands:  Unremarkable. Kidneys, ureters, bladder:  Normal. Uterus and adnexa:  Unremarkable. Lymph nodes:  No adenopathy. Ascites:  None. Gastrointestinal: Sigmoid colon is redundant. There multiple colonic diverticula. Significant inflammatory changes surround the mid to distal sigmoid colon, with the inflammatory changes centered on to portions of the sigmoid colon, both lying in the right lower quadrant. There is some extraluminal air, but no formed abscess. Other diverticula seen along the remainder of colon with no other inflammatory changes. Stomach and small bowel are unremarkable. Musculoskeletal:  Unremarkable. IMPRESSION: 1. Sigmoid diverticulitis. There appears to be 2 sections of the mid sigmoid colon there are inflamed, both projecting in the right lower quadrant. Areas associated extraluminal air reflecting diverticular perforation, but no evidence of an abscess. 2. No other acute  findings. 3. Multiple other noninflamed colonic diverticula. 4. Stable small hepatic cysts. Electronically Signed   By: Lajean Manes M.D.   On: 02/20/2015 21:53   Ct Abdomen Pelvis W Contrast  02/25/2015  CLINICAL DATA:  Known diverticulitis, difficulty walking and voiding EXAM: CT ABDOMEN AND PELVIS WITH CONTRAST TECHNIQUE: Multidetector CT imaging of the abdomen and pelvis was performed using the standard protocol following bolus administration of intravenous contrast. CONTRAST:  185mL OMNIPAQUE IOHEXOL 300 MG/ML  SOLN COMPARISON:  02/20/2015 FINDINGS: Lower chest: Small right pleural effusion. Bibasilar airspace disease likely reflecting atelectasis. Hepatobiliary: Normal liver.  Prior cholecystectomy. Pancreas: Normal. Spleen: Normal. Adrenals/Urinary Tract: Normal adrenal glands. Normal kidneys. No obstructive uropathy. Normal bladder. Stomach/Bowel: Sigmoid diverticulosis with bowel wall thickening and severe surrounding inflammatory changes most consistent with acute diverticulitis. The previously seen small amount of extraluminal air adjacent to the sigmoid colon is significantly larger measuring approximately 4.9 x 5.6 cm. Small volume pneumoperitoneum new compared with the prior examination. Vascular/Lymphatic: Normal caliber abdominal aorta. No lymphadenopathy. Reproductive: Normal uterus.  No adnexal mass. Musculoskeletal: No lytic or sclerotic osseous lesion. No acute osseous abnormality. IMPRESSION: 1. Perforated sigmoid diverticulitis which has worsened compared with 02/20/2015. There is now small volume pneumoperitoneum. Previously seen small amount of contained extraluminal air adjacent to the sigmoid colon is significantly larger measuring approximately 4.9 x 5.6 cm. 2. Small right pleural effusion. Bibasilar airspace disease likely reflecting atelectasis. These results were called by telephone at the time of interpretation on 02/25/2015 at 1:52 pm to Dr. Barkley Bruns, who verbally acknowledged  these results. Electronically Signed   By: Kathreen Devoid   On: 02/25/2015 13:55    Medical Consultants:    Surgery: Alphonsa Overall, MD  Anti-Infectives:    Cipro 02/20/15  > 02/22/15  Flagyl 02/20/15  > 02/22/15  Zosyn 02/22/15 >  Subjective:   Upset abt MRSA swab, some sharp lower abd pain, no n/v  Objective:    Filed Vitals:   02/25/15 1500 02/25/15 2103 02/26/15 0025 02/26/15 0632  BP: 100/52 143/71  128/66  Pulse: 97 109  112  Temp: 98.9 F (37.2 C) 99.6 F (37.6 C)  99.4 F (37.4 C)  TempSrc: Oral Oral  Oral  Resp: 20 20 22 20   Height:      Weight:      SpO2: 95% 95%  93%    Intake/Output Summary (Last 24 hours) at 02/26/15 0908 Last data filed at 02/26/15 0546  Gross per 24 hour  Intake 1921.29 ml  Output      0 ml  Net 1921.29 ml   Filed Weights   02/20/15 2359  Weight: 116.121 kg (256 lb)    Exam: Gen:  Uncomfortable, tearful, AAOx3 Cardiovascular: Tachy, RRR Respiratory:  Lungs CTAB Gastrointestinal:  Abdomen soft, obese, tender, +BS Extremities:  1plus edema b/l   Data Reviewed:    Labs: Basic Metabolic Panel:  Recent Labs Lab 02/20/15 1950 02/21/15 0547 02/22/15 0725 02/23/15 0540 02/24/15 0550 02/25/15 0545 02/26/15 0700  NA 142 139 138 139 138 139 137  K 2.9* 3.0* 3.4* 3.3* 3.5 3.6 3.6  CL 102 106 107 107 107 105 108  CO2 29 27 22  21* 21* 24 17*  GLUCOSE 135* 125* 103* 110* 105* 118* 102*  BUN 7 6 6 7 7 6 6   CREATININE 0.85 0.74 0.66 0.72 0.69 0.71 0.62  CALCIUM 8.7* 7.8* 7.9* 8.5* 8.5* 8.3* 8.1*  MG 1.7 1.7  --   --  2.0  --   --  PHOS  --  2.7  --   --   --   --   --    GFR Estimated Creatinine Clearance: 100.5 mL/min (by C-G formula based on Cr of 0.62). Liver Function Tests:  Recent Labs Lab 02/20/15 1950 02/21/15 0547 02/25/15 0545  AST 21 11* 11*  ALT 15 10* 9*  ALKPHOS 45 38 35*  BILITOT 1.0 1.0 0.8  PROT 7.2 6.1* 6.1*  ALBUMIN 3.8 3.1* 2.8*    Recent Labs Lab 02/20/15 1950  LIPASE 19   No results for  input(s): AMMONIA in the last 168 hours. Coagulation profile  Recent Labs Lab 02/26/15 0700  INR 1.27    CBC:  Recent Labs Lab 02/22/15 0725 02/23/15 0540 02/24/15 0550 02/25/15 0545 02/26/15 0700  WBC 9.6 9.7 8.5 8.1 9.9  HGB 9.7* 10.4* 10.1* 9.4* 7.6*  HCT 30.0* 32.2* 31.4* 29.1* 22.9*  MCV 83.6 82.6 81.1 82.4 80.1  PLT 330 357 344 341 368   Cardiac Enzymes: No results for input(s): CKTOTAL, CKMB, CKMBINDEX, TROPONINI in the last 168 hours. BNP (last 3 results) No results for input(s): PROBNP in the last 8760 hours. CBG: No results for input(s): GLUCAP in the last 168 hours. D-Dimer: No results for input(s): DDIMER in the last 72 hours. Hgb A1c: No results for input(s): HGBA1C in the last 72 hours. Lipid Profile: No results for input(s): CHOL, HDL, LDLCALC, TRIG, CHOLHDL, LDLDIRECT in the last 72 hours. Thyroid function studies: No results for input(s): TSH, T4TOTAL, T3FREE, THYROIDAB in the last 72 hours.  Invalid input(s): FREET3 Anemia work up: No results for input(s): VITAMINB12, FOLATE, FERRITIN, TIBC, IRON, RETICCTPCT in the last 72 hours. Sepsis Labs:  Recent Labs Lab 02/20/15 2318 02/21/15 0547  02/23/15 0540 02/24/15 0550 02/25/15 0545 02/26/15 0700  WBC  --  9.8  < > 9.7 8.5 8.1 9.9  LATICACIDVEN 0.9 0.7  --   --   --   --   --   < > = values in this interval not displayed. Microbiology No results found for this or any previous visit (from the past 240 hour(s)).   Medications:   . diltiazem  180 mg Oral Daily  . ertapenem  1 g Intravenous Q24H  . metoprolol  50 mg Oral BID  . ranitidine  150 mg Oral BID  . sodium chloride  3 mL Intravenous Q12H   Continuous Infusions: . 0.9 % sodium chloride with kcl 100 mL/hr at 02/25/15 2309    Time spent: 25 minutes.   LOS: 6 days   [facility]  [facility] Pager [phone]  *Please refer to [email or web address], password [email or web address] to get updated schedule on who will round on this patient, as  hospitalists switch teams weekly. If 7PM-7AM, please contact night-coverage at [email or web address], password [email or web address] for any overnight needs.  02/26/2015, 9:08 AM

## 2015-02-26 NOTE — Progress Notes (Signed)
Upon reading patient's positive MRSA lab result 12/2013 and noticing that this patient was never swabbed upon admission... patient was swabbed this am and put on contact precautions pending results.  Patient was extremely agitated about not being informed of her previous MRSA result in 2015 at [facility].  She said she never knew she tested positive and did not receive any medication.  She is agitated and wanting to call the doctor this am.   I ensured her that this was not an emergency and could wait until the MD rounded this am.   I also informed her that she will be put on precautions until the results come back today.  I also discussed with the patient that she may not be positive, but we must swab her and place her on precautions for now. Patient was swabbed and results are pending.  Will notify charge RN Jerelene Redden, Ailene Ravel [place]

## 2015-02-26 NOTE — Progress Notes (Signed)
Pt wants CPAP at around midnight.  

## 2015-02-26 NOTE — Progress Notes (Addendum)
Pharmacy - IV heparin  Assessment: 61 yoF on heparin bridging for Afib while off Eliquis.  Heparin held this AM for IR drain placement.  Procedure completed successfully and Dr. Kathlene Cote OK to restart IV heparin; however IV site was lost earlier today and only other access is in use. RN attempting to place additional line.  Plan: Will restart heparin once IV access placed  Reuel Boom, PharmD, BCPS Pager: [phone] 02/26/2015, 6:06 PM   19:00 UPDATE  RN informed pharmacy that additional line had been placed  Restart heparin 2200 units/hr with no bolus d/t recent IR procedure  Recheck heparin level in 6 hrs  Reuel Boom, PharmD, BCPS Pager: [phone] 02/26/2015, 7:58 PM

## 2015-02-26 NOTE — Procedures (Signed)
Interventional Radiology Procedure Note  Procedure:  CT guided drainage of diverticular abscess  Complications:  None  Estimated Blood Loss: < 10 mL  12 Fr drain placed in diverticular abscess.  Purulent fluid aspirated and sample sent for culture.  Drain to suction bulb. Will follow.  Venetia Night. Kathlene Cote, M.D Pager:  [phone]

## 2015-02-27 LAB — BASIC METABOLIC PANEL
Anion gap: 9 (ref 5–15)
BUN: 5 mg/dL — AB (ref 6–20)
CHLORIDE: 108 mmol/L (ref 101–111)
CO2: 22 mmol/L (ref 22–32)
Calcium: 8.3 mg/dL — ABNORMAL LOW (ref 8.9–10.3)
Creatinine, Ser: 0.74 mg/dL (ref 0.44–1.00)
GFR calc Af Amer: >60 mL/min (ref >60)
GFR calc non Af Amer: >60 mL/min (ref >60)
Glucose, Bld: 93 mg/dL (ref 65–99)
POTASSIUM: 3.8 mmol/L (ref 3.5–5.1)
SODIUM: 139 mmol/L (ref 135–145)

## 2015-02-27 LAB — URINALYSIS, ROUTINE W REFLEX MICROSCOPIC
Bilirubin Urine: NEGATIVE
Glucose, UA: NEGATIVE mg/dL
LEUKOCYTES UA: NEGATIVE
NITRITE: NEGATIVE
PH: 6 (ref 5.0–8.0)
PROTEIN: NEGATIVE mg/dL
Specific Gravity, Urine: 1.012 (ref 1.005–1.030)

## 2015-02-27 LAB — URINE MICROSCOPIC-ADD ON

## 2015-02-27 LAB — CBC
HCT: 26.1 % — ABNORMAL LOW (ref 36.0–46.0)
HEMOGLOBIN: 8.5 g/dL — AB (ref 12.0–15.0)
MCH: 26.6 pg (ref 26.0–34.0)
MCHC: 32.6 g/dL (ref 30.0–36.0)
MCV: 81.8 fL (ref 78.0–100.0)
Platelets: 372 10*3/uL (ref 150–400)
RBC: 3.19 MIL/uL — AB (ref 3.87–5.11)
RDW: 14.9 % (ref 11.5–15.5)
WBC: 7.8 10*3/uL (ref 4.0–10.5)

## 2015-02-27 LAB — HEPARIN LEVEL (UNFRACTIONATED)
HEPARIN UNFRACTIONATED: 0.3 [IU]/mL (ref 0.30–0.70)
Heparin Unfractionated: 0.39 IU/mL (ref 0.30–0.70)

## 2015-02-27 MED ORDER — MUPIROCIN 2 % EX OINT: TOPICAL_OINTMENT | Freq: Two times a day (BID) | CUTANEOUS | Status: DC

## 2015-02-27 MED ORDER — ZOLPIDEM TARTRATE 5 MG PO TABS
5.0000 mg | ORAL_TABLET | Freq: Every evening | ORAL | Status: DC | PRN
  Administered 2015-02-28: 5 mg via ORAL
  Filled 2015-02-27: qty 1

## 2015-02-27 MED ORDER — SODIUM CHLORIDE 0.9 % IJ SOLN
10.0000 mL | INTRAMUSCULAR | Status: DC | PRN
  Administered 2015-02-28 – 2015-03-05 (×6): 10 mL
  Filled 2015-02-27 (×6): qty 40

## 2015-02-27 NOTE — Progress Notes (Signed)
Pharmacy - IV heparin  Assessment: 45 yoF on heparin bridging for Afib while off Eliquis.  Heparin held this AM for IR drain placement.  Procedure completed successfully and Dr. Kathlene Cote OK to restart IV heparin.   0247 HL=0.30, no problems with infusion and small amount of vaginal bleeding reported per RN  Plan:  Continue heparin drip @ 2200 units/hr  Recheck HL today at 11am to ensure stays in therapeutic range  Thank Dorrene German 02/27/2015 4:11 AM

## 2015-02-27 NOTE — Progress Notes (Signed)
ANTICOAGULATION CONSULT NOTE - Follow Up Consult  Pharmacy Consult for Heparin Indication: bridge therapy for Afib while off Apixaban  Allergies  Allergen Reactions  . Benadryl [Diphenhydramine Hcl] Hives and Itching  . Codeine Nausea And Vomiting  . Dilaudid [Hydromorphone Hcl]     "feels like she dying '  . Percocet [Oxycodone-Acetaminophen] Hives and Itching   Patient Measurements: Height: 5\' 2"  (157.5 cm) Weight: 256 lb (116.121 kg) IBW/kg (Calculated) : 50.1 HEPARIN DW (KG): 78.7  Vital Signs: Temp: 99.2 F (37.3 C) (01/14 0210) Temp Source: Oral (01/14 0210) BP: 122/79 mmHg (01/14 0936) Pulse Rate: 96 (01/14 0936)  Labs:  Recent Labs  02/25/15 0545  02/26/15 0700 02/27/15 0247 02/27/15 1150  HGB 9.4*  --  7.6* 8.5*  --   HCT 29.1*  --  22.9* 26.1*  --   PLT 341  --  368 372  --   LABPROT  --   --  16.0*  --   --   INR  --   --  1.27  --   --   HEPARINUNFRC 0.17*  < > 0.21* 0.30 0.39  CREATININE 0.71  --  0.62 0.74  --   < > = values in this interval not displayed.  Estimated Creatinine Clearance: 100.5 mL/min (by C-G formula based on Cr of 0.74).  Medications:  Infusions:  . heparin 2,200 Units/hr (02/27/15 0837)  . 0.9 % sodium chloride with kcl 100 mL/hr at 02/26/15 2304  PTA: apixaban 5mg  BID LD 1/6@ 2100  Assessment: 50 yo F admitted with sigmoid diverticulitis and microperforation currently being treated conservatively with bowel rest and IV antibiotics.  She is on chronic Apixaban for hx Afib which was held on admission.  Pharmacy asked to manage heparin bridge while off apixaban.  Heparin held 1/13 for IR drain placement, Heparin resumption delayed by loss of IV access.   02/27/2015:  Heparin level therapeutic x2 after Heparin resumed  Hg low, stable. Slight vaginal bleeding overnight  Goal of Therapy:  Heparin level 0.3-0.7 units/ml Monitor platelets by anticoagulation protocol: Yes   Plan:   Continue Heparin at 2200 units/hr  Daily  Heparin level, CBC  Minda Ditto PharmD Pager [phone] 02/27/2015, 12:57 PM

## 2015-02-27 NOTE — Progress Notes (Signed)
Progress Note   Crystal Brennan [id number] DOB: [birth year] DOA: 02/20/2015 PCP: No PCP Per Patient   Brief Narrative:   50 year old female patient with history of A. fib on Eliquis, HTN, OSA on nightly CPAP and recurrent diverticulitis (3 prior episodes) presented to the [facility] ED on 02/20/15 with complaints of 2 day history of abdominal pain similar to prior episodes of diverticulitis. No nausea or vomiting reported. Possible constipation-last p.m. 02/20/15. CT abdomen in ED confirmed sigmoid diverticulitis and microperforation. Treating conservatively. Surgeons following.  Assessment/Plan:   Perforated sigmoid diverticulitis with pneumoperitoneum - Treating conservatively with bowel rest (NPO except meds), IV fluids,  - Zosyn changed to Iv Invanz 1/12, Repeat CT 1/12 with worsening perf, clinically stable -per CCS -s/p perc drain per IR 1/13, FU Cultures  Hypokalemia - Replaced.  Anemia -Hb in 7-8range now, no overt bleeding, on IV hepARIN -monitor, transfuse if trends down further -no hemoperitoneum noted on last scan  Chronic diastolic CHF - Compensated. Monitor closely while being hydrated.   Proximal A. Fib - Anticoagulated on Eliquis at home- last dose 02/19/2015. Currently held - Continue IV heparin , held for drain this am - CHA2DS2-VASc score: at least 3. - Continue home dose of Cardizem & Metoprolol.   Essential hypertension - Controlled. Transient hypotension in ED which has resolved.   Morbid obesity/Body mass index is 46.81 kg/(m^2)/OSA - Continue nightly CPAP.  Tobacco abuse - quit in 11/16  History of anxiety and depression  H/o Pos MRSA nasal swab,  -repeat negative   DVT Prophylaxis - Currently on a heparin drip.   Family Communication/Anticipated D/C date and plan/Code Status   Family Communication: spouse at bedside. Disposition Plan: Home when diverticulitis/perf stabilized Code Status: Full code.   Procedure: CT guided  drainage of diverticular abscess  12 Fr drain placed in diverticular abscess. Purulent fluid aspirated and sample sent for culture. Drain to suction bulb   Procedures and diagnostic studies:   Ct Abdomen Pelvis W Contrast  02/20/2015  CLINICAL DATA:  Abdominal pain. Patient believes this is diverticulitis. History of diverticulitis and hypertension as well as sickle cell trait. EXAM: CT ABDOMEN AND PELVIS WITH CONTRAST TECHNIQUE: Multidetector CT imaging of the abdomen and pelvis was performed using the standard protocol following bolus administration of intravenous contrast. CONTRAST:  17mL OMNIPAQUE IOHEXOL 300 MG/ML SOLN, 145mL OMNIPAQUE IOHEXOL 300 MG/ML SOLN COMPARISON:  09/30/2014 FINDINGS: Lung bases: Dependent subsegmental atelectasis. No convincing pneumonia or edema. Heart top-normal in size. Several low-density liver lesions are noted, which do not enhance and are stable from the prior CT consistent with cysts. Largest lies in the posterior segment of the right lobe measuring 1 cm. Liver otherwise unremarkable. Gallbladder surgically absent.  No bile duct dilation. Spleen, pancreas, adrenal glands:  Unremarkable. Kidneys, ureters, bladder:  Normal. Uterus and adnexa:  Unremarkable. Lymph nodes:  No adenopathy. Ascites:  None. Gastrointestinal: Sigmoid colon is redundant. There multiple colonic diverticula. Significant inflammatory changes surround the mid to distal sigmoid colon, with the inflammatory changes centered on to portions of the sigmoid colon, both lying in the right lower quadrant. There is some extraluminal air, but no formed abscess. Other diverticula seen along the remainder of colon with no other inflammatory changes. Stomach and small bowel are unremarkable. Musculoskeletal:  Unremarkable. IMPRESSION: 1. Sigmoid diverticulitis. There appears to be 2 sections of the mid sigmoid colon there are inflamed, both projecting in the right lower quadrant. Areas associated extraluminal air  reflecting diverticular perforation, but  no evidence of an abscess. 2. No other acute findings. 3. Multiple other noninflamed colonic diverticula. 4. Stable small hepatic cysts. Electronically Signed   By: Lajean Manes M.D.   On: 02/20/2015 21:53   Ct Abdomen Pelvis W Contrast  02/25/2015  CLINICAL DATA:  Known diverticulitis, difficulty walking and voiding EXAM: CT ABDOMEN AND PELVIS WITH CONTRAST TECHNIQUE: Multidetector CT imaging of the abdomen and pelvis was performed using the standard protocol following bolus administration of intravenous contrast. CONTRAST:  162mL OMNIPAQUE IOHEXOL 300 MG/ML  SOLN COMPARISON:  02/20/2015 FINDINGS: Lower chest: Small right pleural effusion. Bibasilar airspace disease likely reflecting atelectasis. Hepatobiliary: Normal liver.  Prior cholecystectomy. Pancreas: Normal. Spleen: Normal. Adrenals/Urinary Tract: Normal adrenal glands. Normal kidneys. No obstructive uropathy. Normal bladder. Stomach/Bowel: Sigmoid diverticulosis with bowel wall thickening and severe surrounding inflammatory changes most consistent with acute diverticulitis. The previously seen small amount of extraluminal air adjacent to the sigmoid colon is significantly larger measuring approximately 4.9 x 5.6 cm. Small volume pneumoperitoneum new compared with the prior examination. Vascular/Lymphatic: Normal caliber abdominal aorta. No lymphadenopathy. Reproductive: Normal uterus.  No adnexal mass. Musculoskeletal: No lytic or sclerotic osseous lesion. No acute osseous abnormality. IMPRESSION: 1. Perforated sigmoid diverticulitis which has worsened compared with 02/20/2015. There is now small volume pneumoperitoneum. Previously seen small amount of contained extraluminal air adjacent to the sigmoid colon is significantly larger measuring approximately 4.9 x 5.6 cm. 2. Small right pleural effusion. Bibasilar airspace disease likely reflecting atelectasis. These results were called by telephone at the time of  interpretation on 02/25/2015 at 1:52 pm to Dr. Barkley Bruns, who verbally acknowledged these results. Electronically Signed   By: Kathreen Devoid   On: 02/25/2015 13:55    Medical Consultants:    Surgery: Alphonsa Overall, MD  Anti-Infectives:    Cipro 02/20/15  > 02/22/15  Flagyl 02/20/15  > 02/22/15  Zosyn 02/22/15 >  Subjective:   Upset abt MRSA swab, some sharp lower abd pain, no n/v  Objective:    Filed Vitals:   02/27/15 0020 02/27/15 0210 02/27/15 0936 02/27/15 1420  BP:  126/69 122/79 128/68  Pulse:  96 96 85  Temp:  99.2 F (37.3 C)  97.6 F (36.4 C)  TempSrc:  Oral  Oral  Resp: 20 20  18   Height:      Weight:      SpO2:  99%  96%    Intake/Output Summary (Last 24 hours) at 02/27/15 1425 Last data filed at 02/27/15 1305  Gross per 24 hour  Intake   2220 ml  Output    491 ml  Net   1729 ml   Filed Weights   02/20/15 2359  Weight: 116.121 kg (256 lb)    Exam: Gen:  Uncomfortable,  AAOx3, anxious Cardiovascular: Tachy, RRR Respiratory:  Lungs CTAB Gastrointestinal:  Abdomen soft, obese, tender, +BS, drain noted with serous discharge Extremities:  1plus edema b/l   Data Reviewed:    Labs: Basic Metabolic Panel:  Recent Labs Lab 02/20/15 1950 02/21/15 0547  02/23/15 0540 02/24/15 0550 02/25/15 0545 02/26/15 0700 02/27/15 0247  NA 142 139  < > 139 138 139 137 139  K 2.9* 3.0*  < > 3.3* 3.5 3.6 3.6 3.8  CL 102 106  < > 107 107 105 108 108  CO2 29 27  < > 21* 21* 24 17* 22  GLUCOSE 135* 125*  < > 110* 105* 118* 102* 93  BUN 7 6  < > 7 7 6 6  5*  CREATININE 0.85 0.74  < > 0.72 0.69 0.71 0.62 0.74  CALCIUM 8.7* 7.8*  < > 8.5* 8.5* 8.3* 8.1* 8.3*  MG 1.7 1.7  --   --  2.0  --   --   --   PHOS  --  2.7  --   --   --   --   --   --   < > = values in this interval not displayed. GFR Estimated Creatinine Clearance: 100.5 mL/min (by C-G formula based on Cr of 0.74). Liver Function Tests:  Recent Labs Lab 02/20/15 1950 02/21/15 0547 02/25/15 0545  AST  21 11* 11*  ALT 15 10* 9*  ALKPHOS 45 38 35*  BILITOT 1.0 1.0 0.8  PROT 7.2 6.1* 6.1*  ALBUMIN 3.8 3.1* 2.8*    Recent Labs Lab 02/20/15 1950  LIPASE 19   No results for input(s): AMMONIA in the last 168 hours. Coagulation profile  Recent Labs Lab 02/26/15 0700  INR 1.27    CBC:  Recent Labs Lab 02/23/15 0540 02/24/15 0550 02/25/15 0545 02/26/15 0700 02/27/15 0247  WBC 9.7 8.5 8.1 9.9 7.8  HGB 10.4* 10.1* 9.4* 7.6* 8.5*  HCT 32.2* 31.4* 29.1* 22.9* 26.1*  MCV 82.6 81.1 82.4 80.1 81.8  PLT 357 344 341 368 372   Cardiac Enzymes: No results for input(s): CKTOTAL, CKMB, CKMBINDEX, TROPONINI in the last 168 hours. BNP (last 3 results) No results for input(s): PROBNP in the last 8760 hours. CBG: No results for input(s): GLUCAP in the last 168 hours. D-Dimer: No results for input(s): DDIMER in the last 72 hours. Hgb A1c: No results for input(s): HGBA1C in the last 72 hours. Lipid Profile: No results for input(s): CHOL, HDL, LDLCALC, TRIG, CHOLHDL, LDLDIRECT in the last 72 hours. Thyroid function studies: No results for input(s): TSH, T4TOTAL, T3FREE, THYROIDAB in the last 72 hours.  Invalid input(s): FREET3 Anemia work up: No results for input(s): VITAMINB12, FOLATE, FERRITIN, TIBC, IRON, RETICCTPCT in the last 72 hours. Sepsis Labs:  Recent Labs Lab 02/20/15 2318 02/21/15 0547  02/24/15 0550 02/25/15 0545 02/26/15 0700 02/27/15 0247  WBC  --  9.8  < > 8.5 8.1 9.9 7.8  LATICACIDVEN 0.9 0.7  --   --   --   --   --   < > = values in this interval not displayed. Microbiology Recent Results (from the past 240 hour(s))  Surgical pcr screen     Status: Abnormal   Collection Time: 02/26/15  5:34 AM  Result Value Ref Range Status   MRSA, PCR NEGATIVE NEGATIVE Final   Staphylococcus aureus POSITIVE (A) NEGATIVE Final    Comment:        The Xpert SA Assay (FDA approved for NASAL specimens in patients over 41 years of age), is one component of a  comprehensive surveillance program.  Test performance has been validated by [facility] for patients greater than or equal to 35 year old. It is not intended to diagnose infection nor to guide or monitor treatment.   Culture, routine-abscess     Status: None (Preliminary result)   Collection Time: 02/26/15  3:19 PM  Result Value Ref Range Status   Specimen Description ABSCESS DIVERTICULAR  Final   Special Requests NONE  Final   Gram Stain   Final    ABUNDANT WBC PRESENT,BOTH PMN AND MONONUCLEAR NO SQUAMOUS EPITHELIAL CELLS SEEN NO ORGANISMS SEEN Performed at [facility]    Culture PENDING  Incomplete   Report Status PENDING  Incomplete  Anaerobic culture     Status: None (Preliminary result)   Collection Time: 02/26/15  3:19 PM  Result Value Ref Range Status   Specimen Description ABSCESS DIVERTICULAR  Final   Special Requests NONE  Final   Gram Stain   Final    ABUNDANT WBC PRESENT,BOTH PMN AND MONONUCLEAR NO SQUAMOUS EPITHELIAL CELLS SEEN NO ORGANISMS SEEN Performed at [facility]    Culture PENDING  Incomplete   Report Status PENDING  Incomplete     Medications:   . diltiazem  180 mg Oral Daily  . ertapenem  1 g Intravenous Q24H  . metoprolol  50 mg Oral BID  . ranitidine  150 mg Oral BID  . sodium chloride  3 mL Intravenous Q12H   Continuous Infusions: . heparin 2,200 Units/hr (02/27/15 0837)  . 0.9 % sodium chloride with kcl 100 mL/hr at 02/27/15 1254    Time spent: 25 minutes.   LOS: 7 days   [facility]  [facility] Pager [phone]  *Please refer to [email or web address], password [email or web address] to get updated schedule on who will round on this patient, as hospitalists switch teams weekly. If 7PM-7AM, please contact night-coverage at [email or web address], password [email or web address] for any overnight needs.  02/27/2015, 2:25 PM

## 2015-02-27 NOTE — Progress Notes (Signed)
Patient ID: Crystal Brennan, female   DOB: Nov 25, 1965, 50 y.o.   MRN: [id number] [facility] Surgery Progress Note:   * No surgery found *  Subjective: Mental status is clear;  Having some right sided abdominal pain.  Right sided drain is bloody brown and translucent--not overt pus Objective: Vital signs in last 24 hours: Temp:  [98.7 F (37.1 C)-99.8 F (37.7 C)] 99.2 F (37.3 C) (01/14 0210) Pulse Rate:  [85-108] 96 (01/14 0210) Resp:  [17-33] 20 (01/14 0210) BP: (124-146)/(59-82) 126/69 mmHg (01/14 0210) SpO2:  [95 %-99 %] 99 % (01/14 0210)  Intake/Output from previous day: 01/13 0701 - 01/14 0700 In: 2000 [I.V.:2000] Out: 103 [Urine:1; Drains:100; Stool:2] Intake/Output this shift:    Physical Exam: Work of breathing is normal.  Complaining of abdominal pain.  They are having difficulty with placing an IV in her.  PIC line requested.    Lab Results:  Results for orders placed or performed during the hospital encounter of 02/20/15 (from the past 48 hour(s))  Heparin level (unfractionated)     Status: Abnormal   Collection Time: 02/25/15  3:05 PM  Result Value Ref Range   Heparin Unfractionated 0.21 (L) 0.30 - 0.70 IU/mL    Comment:        IF HEPARIN RESULTS ARE BELOW EXPECTED VALUES, AND PATIENT DOSAGE HAS BEEN CONFIRMED, SUGGEST FOLLOW UP TESTING OF ANTITHROMBIN III LEVELS.   Heparin level (unfractionated)     Status: Abnormal   Collection Time: 02/25/15 10:45 PM  Result Value Ref Range   Heparin Unfractionated 0.24 (L) 0.30 - 0.70 IU/mL    Comment:        IF HEPARIN RESULTS ARE BELOW EXPECTED VALUES, AND PATIENT DOSAGE HAS BEEN CONFIRMED, SUGGEST FOLLOW UP TESTING OF ANTITHROMBIN III LEVELS.   Surgical pcr screen     Status: Abnormal   Collection Time: 02/26/15  5:34 AM  Result Value Ref Range   MRSA, PCR NEGATIVE NEGATIVE   Staphylococcus aureus POSITIVE (A) NEGATIVE    Comment:        The Xpert SA Assay (FDA approved for NASAL specimens in patients over  23 years of age), is one component of a comprehensive surveillance program.  Test performance has been validated by [facility] for patients greater than or equal to 20 year old. It is not intended to diagnose infection nor to guide or monitor treatment.   CBC     Status: Abnormal   Collection Time: 02/26/15  7:00 AM  Result Value Ref Range   WBC 9.9 4.0 - 10.5 K/uL   RBC 2.86 (L) 3.87 - 5.11 MIL/uL   Hemoglobin 7.6 (L) 12.0 - 15.0 g/dL   HCT 22.9 (L) 36.0 - 46.0 %   MCV 80.1 78.0 - 100.0 fL   MCH 26.6 26.0 - 34.0 pg   MCHC 33.2 30.0 - 36.0 g/dL   RDW 14.9 11.5 - 15.5 %   Platelets 368 150 - 400 K/uL  Protime-INR     Status: Abnormal   Collection Time: 02/26/15  7:00 AM  Result Value Ref Range   Prothrombin Time 16.0 (H) 11.6 - 15.2 seconds   INR 1.27 0.00 - 9.98  Basic metabolic panel     Status: Abnormal   Collection Time: 02/26/15  7:00 AM  Result Value Ref Range   Sodium 137 135 - 145 mmol/L   Potassium 3.6 3.5 - 5.1 mmol/L   Chloride 108 101 - 111 mmol/L   CO2 17 (L) 22 -  32 mmol/L   Glucose, Bld 102 (H) 65 - 99 mg/dL   BUN 6 6 - 20 mg/dL   Creatinine, Ser 0.62 0.44 - 1.00 mg/dL   Calcium 8.1 (L) 8.9 - 10.3 mg/dL   GFR calc non Af Amer >60 >60 mL/min   GFR calc Af Amer >60 >60 mL/min    Comment: (NOTE) The eGFR has been calculated using the CKD EPI equation. This calculation has not been validated in all clinical situations. eGFR's persistently <60 mL/min signify possible Chronic Kidney Disease.    Anion gap 12 5 - 15  Heparin level (unfractionated)     Status: Abnormal   Collection Time: 02/26/15  7:00 AM  Result Value Ref Range   Heparin Unfractionated 0.21 (L) 0.30 - 0.70 IU/mL    Comment:        IF HEPARIN RESULTS ARE BELOW EXPECTED VALUES, AND PATIENT DOSAGE HAS BEEN CONFIRMED, SUGGEST FOLLOW UP TESTING OF ANTITHROMBIN III LEVELS.   CBC     Status: Abnormal   Collection Time: 02/27/15  2:47 AM  Result Value Ref Range   WBC 7.8 4.0 - 10.5 K/uL    RBC 3.19 (L) 3.87 - 5.11 MIL/uL   Hemoglobin 8.5 (L) 12.0 - 15.0 g/dL   HCT 26.1 (L) 36.0 - 46.0 %   MCV 81.8 78.0 - 100.0 fL   MCH 26.6 26.0 - 34.0 pg   MCHC 32.6 30.0 - 36.0 g/dL   RDW 14.9 11.5 - 15.5 %   Platelets 372 150 - 400 K/uL  Basic metabolic panel     Status: Abnormal   Collection Time: 02/27/15  2:47 AM  Result Value Ref Range   Sodium 139 135 - 145 mmol/L   Potassium 3.8 3.5 - 5.1 mmol/L   Chloride 108 101 - 111 mmol/L   CO2 22 22 - 32 mmol/L   Glucose, Bld 93 65 - 99 mg/dL   BUN 5 (L) 6 - 20 mg/dL   Creatinine, Ser 0.74 0.44 - 1.00 mg/dL   Calcium 8.3 (L) 8.9 - 10.3 mg/dL   GFR calc non Af Amer >60 >60 mL/min   GFR calc Af Amer >60 >60 mL/min    Comment: (NOTE) The eGFR has been calculated using the CKD EPI equation. This calculation has not been validated in all clinical situations. eGFR's persistently <60 mL/min signify possible Chronic Kidney Disease.    Anion gap 9 5 - 15  Heparin level (unfractionated)     Status: None   Collection Time: 02/27/15  2:47 AM  Result Value Ref Range   Heparin Unfractionated 0.30 0.30 - 0.70 IU/mL    Comment:        IF HEPARIN RESULTS ARE BELOW EXPECTED VALUES, AND PATIENT DOSAGE HAS BEEN CONFIRMED, SUGGEST FOLLOW UP TESTING OF ANTITHROMBIN III LEVELS.     Radiology/Results: Ct Abdomen Pelvis W Contrast  02/25/2015  CLINICAL DATA:  Known diverticulitis, difficulty walking and voiding EXAM: CT ABDOMEN AND PELVIS WITH CONTRAST TECHNIQUE: Multidetector CT imaging of the abdomen and pelvis was performed using the standard protocol following bolus administration of intravenous contrast. CONTRAST:  157m OMNIPAQUE IOHEXOL 300 MG/ML  SOLN COMPARISON:  02/20/2015 FINDINGS: Lower chest: Small right pleural effusion. Bibasilar airspace disease likely reflecting atelectasis. Hepatobiliary: Normal liver.  Prior cholecystectomy. Pancreas: Normal. Spleen: Normal. Adrenals/Urinary Tract: Normal adrenal glands. Normal kidneys. No obstructive  uropathy. Normal bladder. Stomach/Bowel: Sigmoid diverticulosis with bowel wall thickening and severe surrounding inflammatory changes most consistent with acute diverticulitis. The previously seen small amount  of extraluminal air adjacent to the sigmoid colon is significantly larger measuring approximately 4.9 x 5.6 cm. Small volume pneumoperitoneum new compared with the prior examination. Vascular/Lymphatic: Normal caliber abdominal aorta. No lymphadenopathy. Reproductive: Normal uterus.  No adnexal mass. Musculoskeletal: No lytic or sclerotic osseous lesion. No acute osseous abnormality. IMPRESSION: 1. Perforated sigmoid diverticulitis which has worsened compared with 02/20/2015. There is now small volume pneumoperitoneum. Previously seen small amount of contained extraluminal air adjacent to the sigmoid colon is significantly larger measuring approximately 4.9 x 5.6 cm. 2. Small right pleural effusion. Bibasilar airspace disease likely reflecting atelectasis. These results were called by telephone at the time of interpretation on 02/25/2015 at 1:52 pm to Dr. Barkley Bruns, who verbally acknowledged these results. Electronically Signed   By: Kathreen Devoid   On: 02/25/2015 13:55   Ct Image Guided Drainage By Percutaneous Catheter  02/26/2015  CLINICAL DATA:  Sigmoid diverticulitis with enlarging diverticular abscess. The patient presents for percutaneous drainage. EXAM: CT GUIDED DRAINAGE OF PERITONEAL DIVERTICULAR ABSCESS ANESTHESIA/SEDATION: 2.0 Mg IV Versed 100 mcg IV Fentanyl Total Moderate Sedation Time:  20 minutes PROCEDURE: The procedure, risks, benefits, and alternatives were explained to the patient. Questions regarding the procedure were encouraged and answered. The patient understands and consents to the procedure. CT was performed in a supine position through the lower abdomen and pelvis. The anterior abdominal wall was prepped with Betadine in a sterile fashion, and a sterile drape was applied covering  the operative field. A sterile gown and sterile gloves were used for the procedure. Local anesthesia was provided with 1% Lidocaine. Under CT guidance, an 18 gauge trocar needle was advanced to the level of the diverticular abscess from an anterior approach just to the right of the umbilicus. After needle advanced into the abscess, a guidewire was advanced. The percutaneous tract was dilated and a 12 French pigtail drainage catheter advanced over the wire. The catheter was formed and the wire removed. Additional CT was performed to evaluate drain positioning. A sample of fluid was aspirated from the drain and sent for culture analysis. The drain was flushed and connected to a suction bulb. It was secured at the skin with a Prolene retention suture and StatLock device. COMPLICATIONS: None FINDINGS: After drainage catheter placement, purulent fluid was able to be aspirated. There is good return of purulent fluid after drain placement. Postprocedural CT also shows complete decompression of the component of air in the diverticular abscess. IMPRESSION: CT-guided percutaneous catheter drainage performed of a diverticular abscess. A 12 French drain was placed and attached to suction bulb drainage. Electronically Signed   By: Aletta Edouard M.D.   On: 02/26/2015 16:43    Anti-infectives: Anti-infectives    Start     Dose/Rate Route Frequency Ordered Stop   02/25/15 1600  ertapenem (INVANZ) 1 g in sodium chloride 0.9 % 50 mL IVPB     1 g 100 mL/hr over 30 Minutes Intravenous Every 24 hours 02/25/15 1444     02/22/15 1400  piperacillin-tazobactam (ZOSYN) IVPB 3.375 g  Status:  Discontinued     3.375 g 12.5 mL/hr over 240 Minutes Intravenous 3 times per day 02/22/15 1220 02/25/15 1444   02/22/15 1200  ciprofloxacin (CIPRO) tablet 500 mg  Status:  Discontinued     500 mg Oral 2 times daily 02/22/15 1108 02/22/15 1220   02/21/15 1000  ciprofloxacin (CIPRO) IVPB 400 mg  Status:  Discontinued     400 mg 200 mL/hr  over 60 Minutes Intravenous Every 12 hours  02/20/15 2343 02/22/15 1108   02/21/15 0600  metroNIDAZOLE (FLAGYL) IVPB 500 mg  Status:  Discontinued     500 mg 100 mL/hr over 60 Minutes Intravenous Every 8 hours 02/20/15 2343 02/22/15 1220   02/20/15 2200  ciprofloxacin (CIPRO) IVPB 400 mg  Status:  Discontinued     400 mg 200 mL/hr over 60 Minutes Intravenous  Once 02/20/15 2152 02/22/15 1108   02/20/15 2200  metroNIDAZOLE (FLAGYL) IVPB 500 mg     500 mg 100 mL/hr over 60 Minutes Intravenous  Once 02/20/15 2152 02/21/15 0125      Assessment/Plan: Problem List: Patient Active Problem List   Diagnosis Date Noted  . Hypokalemia 02/20/2015  . Chronic diastolic congestive heart failure, NYHA class 1 ([place]) 02/20/2015  . Diverticulitis of large intestine with perforation without bleeding   . Elevated glucose 06/29/2014  . OSA (obstructive sleep apnea) 04/08/2014  . PAF (paroxysmal atrial fibrillation) ([place]) 01/01/2014  . Sickle cell trait ([place])   . Morbid obesity ([place])   . Tobacco abuse   . Palpitations   . Anxiety   . Anemia   . Depression   . Hypertension   . Chronic back pain   . Epigastric abdominal pain 10/05/2013  . Menometrorrhagia 06/14/2010  . CARDIOMEGALY 12/22/2009  . BACK PAIN, LUMBAR, WITH RADICULOPATHY 05/27/2009  . DEPRESSION, MAJOR 10/02/2007  . GERD 08/26/2007  . ALLERGIC RHINITIS 10/31/2006  . INSOMNIA 07/31/2006    PIC line placement requested.   * No surgery found *    LOS: 7 days   Matt B. Hassell Done, MD, [facility] [phone] beeper [phone]  02/27/2015 8:50 AM

## 2015-02-27 NOTE — Progress Notes (Signed)
Peripherally Inserted Central Catheter/Midline Placement  The IV Nurse has discussed with the patient and/or persons authorized to consent for the patient, the purpose of this procedure and the potential benefits and risks involved with this procedure.  The benefits include less needle sticks, lab draws from the catheter and patient may be discharged home with the catheter.  Risks include, but not limited to, infection, bleeding, blood clot (thrombus formation), and puncture of an artery; nerve damage and irregular heat beat.  Alternatives to this procedure were also discussed.  Family at bedside, present for risks and benefits.  PICC/Midline Placement Documentation  PICC Double Lumen [id number] PICC Right Basilic 37 cm 0 cm (Active)  Indication for Insertion or Continuance of Line Prolonged intravenous therapies;Poor Vasculature-patient has had multiple peripheral attempts or PIVs lasting less than 24 hours;Limited venous access - need for IV therapy >5 days (PICC only) 02/27/2015  6:13 PM  Exposed Catheter (cm) 0 cm 02/27/2015  6:13 PM  Site Assessment Clean;Dry;Intact 02/27/2015  6:13 PM  Lumen #1 Status Flushed;Saline locked;Blood return noted 02/27/2015  6:13 PM  Lumen #2 Status Flushed;Saline locked;Blood return noted 02/27/2015  6:13 PM  Dressing Type Transparent 02/27/2015  6:13 PM  Dressing Status Clean;Dry;Intact;Antimicrobial disc in place 02/27/2015  6:13 PM  Line Care Connections checked and tightened 02/27/2015  6:13 PM  Line Adjustment (NICU/IV Team Only) No 02/27/2015  6:13 PM  Dressing Intervention New dressing 02/27/2015  6:13 PM  Dressing Change Due 03/06/15 02/27/2015  6:13 PM       Rolena Infante 02/27/2015, 6:14 PM

## 2015-02-28 LAB — CBC
HEMATOCRIT: 26.6 % — AB (ref 36.0–46.0)
Hemoglobin: 8.7 g/dL — ABNORMAL LOW (ref 12.0–15.0)
MCH: 26.7 pg (ref 26.0–34.0)
MCHC: 32.7 g/dL (ref 30.0–36.0)
MCV: 81.6 fL (ref 78.0–100.0)
Platelets: 414 10*3/uL — ABNORMAL HIGH (ref 150–400)
RBC: 3.26 MIL/uL — AB (ref 3.87–5.11)
RDW: 14.9 % (ref 11.5–15.5)
WBC: 7.2 10*3/uL (ref 4.0–10.5)

## 2015-02-28 LAB — BASIC METABOLIC PANEL
ANION GAP: 12 (ref 5–15)
BUN: <5 mg/dL — ABNORMAL LOW (ref 6–20)
CO2: 22 mmol/L (ref 22–32)
Calcium: 8.4 mg/dL — ABNORMAL LOW (ref 8.9–10.3)
Chloride: 105 mmol/L (ref 101–111)
Creatinine, Ser: 0.59 mg/dL (ref 0.44–1.00)
GLUCOSE: 92 mg/dL (ref 65–99)
POTASSIUM: 3.9 mmol/L (ref 3.5–5.1)
Sodium: 139 mmol/L (ref 135–145)

## 2015-02-28 LAB — HEPARIN LEVEL (UNFRACTIONATED): Heparin Unfractionated: 0.45 IU/mL (ref 0.30–0.70)

## 2015-02-28 MED ORDER — LORAZEPAM 2 MG/ML IJ SOLN
1.0000 mg | Freq: Two times a day (BID) | INTRAMUSCULAR | Status: DC | PRN
  Administered 2015-02-28 – 2015-03-05 (×6): 1 mg via INTRAVENOUS
  Filled 2015-02-28 (×7): qty 1

## 2015-02-28 MED ORDER — ZOLPIDEM TARTRATE 5 MG PO TABS
5.0000 mg | ORAL_TABLET | Freq: Every day | ORAL | Status: DC
  Administered 2015-03-01 – 2015-03-05 (×6): 5 mg via ORAL
  Filled 2015-02-28 (×6): qty 1

## 2015-02-28 NOTE — Progress Notes (Signed)
ANTICOAGULATION CONSULT NOTE - Follow Up Consult  Pharmacy Consult for Heparin Indication: bridge therapy for Afib while off Apixaban  Allergies  Allergen Reactions  . Benadryl [Diphenhydramine Hcl] Hives and Itching  . Codeine Nausea And Vomiting  . Dilaudid [Hydromorphone Hcl]     "feels like she dying '  . Percocet [Oxycodone-Acetaminophen] Hives and Itching   Patient Measurements: Height: 5\' 2"  (157.5 cm) Weight: 256 lb (116.121 kg) IBW/kg (Calculated) : 50.1 HEPARIN DW (KG): 78.7  Vital Signs: Temp: 98.2 F (36.8 C) (01/15 0646) Temp Source: Oral (01/15 0646) BP: 126/72 mmHg (01/15 0646) Pulse Rate: 89 (01/15 0646)  Labs:  Recent Labs  02/26/15 0700 02/27/15 0247 02/27/15 1150 02/28/15 0610  HGB 7.6* 8.5*  --  8.7*  HCT 22.9* 26.1*  --  26.6*  PLT 368 372  --  414*  LABPROT 16.0*  --   --   --   INR 1.27  --   --   --   HEPARINUNFRC 0.21* 0.30 0.39 0.45  CREATININE 0.62 0.74  --  0.59    Estimated Creatinine Clearance: 100.5 mL/min (by C-G formula based on Cr of 0.59).  Medications:  Infusions:  . heparin 2,200 Units/hr (02/27/15 2210)  . 0.9 % sodium chloride with kcl 100 mL/hr at 02/27/15 1701  PTA: apixaban 5mg  BID LD 1/6@ 2100  Assessment: 50 yo F admitted with sigmoid diverticulitis and microperforation currently being treated conservatively with bowel rest and IV antibiotics.  She is on chronic Apixaban for hx Afib which was held on admission.  Pharmacy asked to manage heparin bridge while off apixaban.  Heparin held 1/13 for IR drain placement, Heparin resumption delayed by loss of IV access.   PICC placed 1/14  02/28/2015:  Heparin level therapeutic, 0.45 units/ml  Hg low but stable, Plt 414. Slight vaginal bleeding overnight 1/13.  Goal of Therapy:  Heparin level 0.3-0.7 units/ml Monitor platelets by anticoagulation protocol: Yes   Plan:   Continue Heparin at 2200 units/hr  Daily Heparin level, CBC  Minda Ditto PharmD Pager  [phone] 02/28/2015, 8:16 AM

## 2015-02-28 NOTE — Progress Notes (Signed)
Patient ID: Crystal Brennan, female   DOB: 1965-08-28, 50 y.o.   MRN: [id number] [facility] Surgery Progress Note:   * No surgery found *  Subjective: Mental status is clear.  Sitting on toilet Objective: Vital signs in last 24 hours: Temp:  [97.6 F (36.4 C)-98.8 F (37.1 C)] 98.2 F (36.8 C) (01/15 0646) Pulse Rate:  [85-90] 89 (01/15 0646) Resp:  [18-19] 19 (01/15 0646) BP: (126-128)/(67-72) 126/72 mmHg (01/15 0646) SpO2:  [96 %-98 %] 98 % (01/15 0646)  Intake/Output from previous day: 01/14 0701 - 01/15 0700 In: 2542 [P.O.:1050; I.V.:1422; IV Piggyback:50] Out: 403 [Urine:355; Drains:48] Intake/Output this shift:    Physical Exam: Work of breathing is not labored.  Drain in place; pain much better controlled  Lab Results:  Results for orders placed or performed during the hospital encounter of 02/20/15 (from the past 48 hour(s))  Culture, routine-abscess     Status: None (Preliminary result)   Collection Time: 02/26/15  3:19 PM  Result Value Ref Range   Specimen Description ABSCESS DIVERTICULAR    Special Requests NONE    Gram Stain      ABUNDANT WBC PRESENT,BOTH PMN AND MONONUCLEAR NO SQUAMOUS EPITHELIAL CELLS SEEN NO ORGANISMS SEEN Performed at [facility]    Culture      NO GROWTH 1 DAY Performed at [facility]    Report Status PENDING   Anaerobic culture     Status: None (Preliminary result)   Collection Time: 02/26/15  3:19 PM  Result Value Ref Range   Specimen Description ABSCESS DIVERTICULAR    Special Requests NONE    Gram Stain      ABUNDANT WBC PRESENT,BOTH PMN AND MONONUCLEAR NO SQUAMOUS EPITHELIAL CELLS SEEN NO ORGANISMS SEEN Performed at [facility]    Culture PENDING    Report Status PENDING   CBC     Status: Abnormal   Collection Time: 02/27/15  2:47 AM  Result Value Ref Range   WBC 7.8 4.0 - 10.5 K/uL   RBC 3.19 (L) 3.87 - 5.11 MIL/uL   Hemoglobin 8.5 (L) 12.0 - 15.0 g/dL   HCT 26.1 (L) 36.0 - 46.0 %   MCV  81.8 78.0 - 100.0 fL   MCH 26.6 26.0 - 34.0 pg   MCHC 32.6 30.0 - 36.0 g/dL   RDW 14.9 11.5 - 15.5 %   Platelets 372 150 - 400 K/uL  Basic metabolic panel     Status: Abnormal   Collection Time: 02/27/15  2:47 AM  Result Value Ref Range   Sodium 139 135 - 145 mmol/L   Potassium 3.8 3.5 - 5.1 mmol/L   Chloride 108 101 - 111 mmol/L   CO2 22 22 - 32 mmol/L   Glucose, Bld 93 65 - 99 mg/dL   BUN 5 (L) 6 - 20 mg/dL   Creatinine, Ser 0.74 0.44 - 1.00 mg/dL   Calcium 8.3 (L) 8.9 - 10.3 mg/dL   GFR calc non Af Amer >60 >60 mL/min   GFR calc Af Amer >60 >60 mL/min    Comment: (NOTE) The eGFR has been calculated using the CKD EPI equation. This calculation has not been validated in all clinical situations. eGFR's persistently <60 mL/min signify possible Chronic Kidney Disease.    Anion gap 9 5 - 15  Heparin level (unfractionated)     Status: None   Collection Time: 02/27/15  2:47 AM  Result Value Ref Range   Heparin Unfractionated 0.30 0.30 - 0.70 IU/mL  Comment:        IF HEPARIN RESULTS ARE BELOW EXPECTED VALUES, AND PATIENT DOSAGE HAS BEEN CONFIRMED, SUGGEST FOLLOW UP TESTING OF ANTITHROMBIN III LEVELS.   Urinalysis, Routine w reflex microscopic (not at [facility])     Status: Abnormal   Collection Time: 02/27/15  9:09 AM  Result Value Ref Range   Color, Urine YELLOW YELLOW   APPearance CLEAR CLEAR   Specific Gravity, Urine 1.012 1.005 - 1.030   pH 6.0 5.0 - 8.0   Glucose, UA NEGATIVE NEGATIVE mg/dL   Hgb urine dipstick MODERATE (A) NEGATIVE   Bilirubin Urine NEGATIVE NEGATIVE   Ketones, ur >80 (A) NEGATIVE mg/dL   Protein, ur NEGATIVE NEGATIVE mg/dL   Nitrite NEGATIVE NEGATIVE   Leukocytes, UA NEGATIVE NEGATIVE  Urine microscopic-add on     Status: Abnormal   Collection Time: 02/27/15  9:09 AM  Result Value Ref Range   Squamous Epithelial / LPF 0-5 (A) NONE SEEN   WBC, UA 0-5 0 - 5 WBC/hpf   RBC / HPF 6-30 0 - 5 RBC/hpf   Bacteria, UA RARE (A) NONE SEEN  Heparin level  (unfractionated)     Status: None   Collection Time: 02/27/15 11:50 AM  Result Value Ref Range   Heparin Unfractionated 0.39 0.30 - 0.70 IU/mL    Comment:        IF HEPARIN RESULTS ARE BELOW EXPECTED VALUES, AND PATIENT DOSAGE HAS BEEN CONFIRMED, SUGGEST FOLLOW UP TESTING OF ANTITHROMBIN III LEVELS.   CBC     Status: Abnormal   Collection Time: 02/28/15  6:10 AM  Result Value Ref Range   WBC 7.2 4.0 - 10.5 K/uL   RBC 3.26 (L) 3.87 - 5.11 MIL/uL   Hemoglobin 8.7 (L) 12.0 - 15.0 g/dL   HCT 26.6 (L) 36.0 - 46.0 %   MCV 81.6 78.0 - 100.0 fL   MCH 26.7 26.0 - 34.0 pg   MCHC 32.7 30.0 - 36.0 g/dL   RDW 14.9 11.5 - 15.5 %   Platelets 414 (H) 150 - 400 K/uL  Basic metabolic panel     Status: Abnormal   Collection Time: 02/28/15  6:10 AM  Result Value Ref Range   Sodium 139 135 - 145 mmol/L   Potassium 3.9 3.5 - 5.1 mmol/L   Chloride 105 101 - 111 mmol/L   CO2 22 22 - 32 mmol/L   Glucose, Bld 92 65 - 99 mg/dL   BUN <5 (L) 6 - 20 mg/dL   Creatinine, Ser 0.59 0.44 - 1.00 mg/dL   Calcium 8.4 (L) 8.9 - 10.3 mg/dL   GFR calc non Af Amer >60 >60 mL/min   GFR calc Af Amer >60 >60 mL/min    Comment: (NOTE) The eGFR has been calculated using the CKD EPI equation. This calculation has not been validated in all clinical situations. eGFR's persistently <60 mL/min signify possible Chronic Kidney Disease.    Anion gap 12 5 - 15  Heparin level (unfractionated)     Status: None   Collection Time: 02/28/15  6:10 AM  Result Value Ref Range   Heparin Unfractionated 0.45 0.30 - 0.70 IU/mL    Comment:        IF HEPARIN RESULTS ARE BELOW EXPECTED VALUES, AND PATIENT DOSAGE HAS BEEN CONFIRMED, SUGGEST FOLLOW UP TESTING OF ANTITHROMBIN III LEVELS.     Radiology/Results: Ct Image Guided Drainage By Percutaneous Catheter  02/26/2015  CLINICAL DATA:  Sigmoid diverticulitis with enlarging diverticular abscess. The patient presents for percutaneous  drainage. EXAM: CT GUIDED DRAINAGE OF PERITONEAL  DIVERTICULAR ABSCESS ANESTHESIA/SEDATION: 2.0 Mg IV Versed 100 mcg IV Fentanyl Total Moderate Sedation Time:  20 minutes PROCEDURE: The procedure, risks, benefits, and alternatives were explained to the patient. Questions regarding the procedure were encouraged and answered. The patient understands and consents to the procedure. CT was performed in a supine position through the lower abdomen and pelvis. The anterior abdominal wall was prepped with Betadine in a sterile fashion, and a sterile drape was applied covering the operative field. A sterile gown and sterile gloves were used for the procedure. Local anesthesia was provided with 1% Lidocaine. Under CT guidance, an 18 gauge trocar needle was advanced to the level of the diverticular abscess from an anterior approach just to the right of the umbilicus. After needle advanced into the abscess, a guidewire was advanced. The percutaneous tract was dilated and a 12 French pigtail drainage catheter advanced over the wire. The catheter was formed and the wire removed. Additional CT was performed to evaluate drain positioning. A sample of fluid was aspirated from the drain and sent for culture analysis. The drain was flushed and connected to a suction bulb. It was secured at the skin with a Prolene retention suture and StatLock device. COMPLICATIONS: None FINDINGS: After drainage catheter placement, purulent fluid was able to be aspirated. There is good return of purulent fluid after drain placement. Postprocedural CT also shows complete decompression of the component of air in the diverticular abscess. IMPRESSION: CT-guided percutaneous catheter drainage performed of a diverticular abscess. A 12 French drain was placed and attached to suction bulb drainage. Electronically Signed   By: Aletta Edouard M.D.   On: 02/26/2015 16:43    Anti-infectives: Anti-infectives    Start     Dose/Rate Route Frequency Ordered Stop   02/25/15 1600  ertapenem (INVANZ) 1 g in sodium  chloride 0.9 % 50 mL IVPB     1 g 100 mL/hr over 30 Minutes Intravenous Every 24 hours 02/25/15 1444     02/22/15 1400  piperacillin-tazobactam (ZOSYN) IVPB 3.375 g  Status:  Discontinued     3.375 g 12.5 mL/hr over 240 Minutes Intravenous 3 times per day 02/22/15 1220 02/25/15 1444   02/22/15 1200  ciprofloxacin (CIPRO) tablet 500 mg  Status:  Discontinued     500 mg Oral 2 times daily 02/22/15 1108 02/22/15 1220   02/21/15 1000  ciprofloxacin (CIPRO) IVPB 400 mg  Status:  Discontinued     400 mg 200 mL/hr over 60 Minutes Intravenous Every 12 hours 02/20/15 2343 02/22/15 1108   02/21/15 0600  metroNIDAZOLE (FLAGYL) IVPB 500 mg  Status:  Discontinued     500 mg 100 mL/hr over 60 Minutes Intravenous Every 8 hours 02/20/15 2343 02/22/15 1220   02/20/15 2200  ciprofloxacin (CIPRO) IVPB 400 mg  Status:  Discontinued     400 mg 200 mL/hr over 60 Minutes Intravenous  Once 02/20/15 2152 02/22/15 1108   02/20/15 2200  metroNIDAZOLE (FLAGYL) IVPB 500 mg     500 mg 100 mL/hr over 60 Minutes Intravenous  Once 02/20/15 2152 02/21/15 0125      Assessment/Plan: Problem List: Patient Active Problem List   Diagnosis Date Noted  . Hypokalemia 02/20/2015  . Chronic diastolic congestive heart failure, NYHA class 1 ([place]) 02/20/2015  . Diverticulitis of large intestine with perforation without bleeding   . Elevated glucose 06/29/2014  . OSA (obstructive sleep apnea) 04/08/2014  . PAF (paroxysmal atrial fibrillation) ([place]) 01/01/2014  . Sickle  cell trait ([place])   . Morbid obesity ([place])   . Tobacco abuse   . Palpitations   . Anxiety   . Anemia   . Depression   . Hypertension   . Chronic back pain   . Epigastric abdominal pain 10/05/2013  . Menometrorrhagia 06/14/2010  . CARDIOMEGALY 12/22/2009  . BACK PAIN, LUMBAR, WITH RADICULOPATHY 05/27/2009  . DEPRESSION, MAJOR 10/02/2007  . GERD 08/26/2007  . ALLERGIC RHINITIS 10/31/2006  . INSOMNIA 07/31/2006    Improved for per drain in collection.    * No surgery found *    LOS: 8 days   Matt B. Hassell Done, MD, [facility] [phone] beeper [phone]  02/28/2015 9:40 AM

## 2015-02-28 NOTE — Progress Notes (Signed)
Chief Complaint: Abdominal abscess s/p perc drain  Subjective: Pt feeling a little better. Less pain  Allergies: Benadryl; Codeine; Dilaudid; and Percocet  Medications:  Current facility-administered medications:  .  acetaminophen (TYLENOL) tablet 650 mg, 650 mg, Oral, Q6H PRN, 650 mg at 02/21/15 0853 **OR** acetaminophen (TYLENOL) suppository 650 mg, 650 mg, Rectal, Q6H PRN, Toy Baker, MD .  diltiazem (CARDIZEM CD) 24 hr capsule 180 mg, 180 mg, Oral, Daily, Modena Jansky, MD, 180 mg at 02/28/15 1011 .  ertapenem [facility]) 1 g in sodium chloride 0.9 % 50 mL IVPB, 1 g, Intravenous, Q24H, Earnstine Regal, PA-C, 1 g at 02/27/15 1631 .  heparin ADULT infusion 100 units/mL (25000 units/250 mL), 2,200 Units/hr, Intravenous, Continuous, Polly Cobia, RPH, Last Rate: 22 mL/hr at 02/27/15 2210, 2,200 Units/hr at 02/27/15 2210 .  metoprolol (LOPRESSOR) tablet 50 mg, 50 mg, Oral, BID, Modena Jansky, MD, 50 mg at 02/28/15 1011 .  morphine 2 MG/ML injection 2-6 mg, 2-6 mg, Intravenous, Q2H PRN, Earnstine Regal, PA-C, 6 mg at 02/28/15 1011 .  ondansetron (ZOFRAN) tablet 4 mg, 4 mg, Oral, Q6H PRN **OR** ondansetron (ZOFRAN) injection 4 mg, 4 mg, Intravenous, Q6H PRN, Toy Baker, MD, 4 mg at 02/28/15 0505 .  phenol (CHLORASEPTIC) mouth spray 1 spray, 1 spray, Mouth/Throat, PRN, Rhetta Mura Schorr, NP, 1 spray at 02/22/15 0536 .  ranitidine (ZANTAC) 150 MG/10ML syrup 150 mg, 150 mg, Oral, BID, Jackolyn Confer, MD, 150 mg at 02/28/15 1011 .  simethicone (MYLICON) chewable tablet 160 mg, 160 mg, Oral, QID PRN, Venetia Maxon Rama, MD, 160 mg at 02/27/15 1723 .  sodium chloride (OCEAN) 0.65 % nasal spray 1 spray, 1 spray, Each Nare, PRN, Venetia Maxon Rama, MD, 1 spray at 02/22/15 1354 .  sodium chloride 0.9 % 1,000 mL with potassium chloride 30 mEq infusion, , Intravenous, Continuous, Earnstine Regal, PA-C, Last Rate: 100 mL/hr at 02/27/15 1701 .  sodium chloride 0.9 % injection  10-40 mL, 10-40 mL, Intracatheter, PRN, Domenic Polite, MD, 10 mL at 02/28/15 0639 .  sodium chloride 0.9 % injection 3 mL, 3 mL, Intravenous, Q12H, Toy Baker, MD, 3 mL at 02/28/15 1013 .  zolpidem (AMBIEN) tablet 5 mg, 5 mg, Oral, QHS PRN, Dianne Dun, NP, 5 mg at 02/28/15 0003    Vital Signs: BP 129/69 mmHg  Pulse 89  Temp(Src) 98.2 F (36.8 C) (Oral)  Resp 19  Ht 5\' 2"  (1.575 m)  Wt 256 lb (116.121 kg)  BMI 46.81 kg/m2  SpO2 99%  LMP 02/22/2015  Physical Exam  Constitutional: She appears well-developed and well-nourished. No distress.  Cardiovascular: Normal rate, regular rhythm and normal heart sounds.   Pulmonary/Chest: Effort normal. No respiratory distress.  Abdominal: Soft. There is tenderness.  Drain intact, site clean Output less purulent, about 84mL recorded last 24hrs    Imaging: Ct Abdomen Pelvis W Contrast  02/25/2015  CLINICAL DATA:  Known diverticulitis, difficulty walking and voiding EXAM: CT ABDOMEN AND PELVIS WITH CONTRAST TECHNIQUE: Multidetector CT imaging of the abdomen and pelvis was performed using the standard protocol following bolus administration of intravenous contrast. CONTRAST:  166mL OMNIPAQUE IOHEXOL 300 MG/ML  SOLN COMPARISON:  02/20/2015 FINDINGS: Lower chest: Small right pleural effusion. Bibasilar airspace disease likely reflecting atelectasis. Hepatobiliary: Normal liver.  Prior cholecystectomy. Pancreas: Normal. Spleen: Normal. Adrenals/Urinary Tract: Normal adrenal glands. Normal kidneys. No obstructive uropathy. Normal bladder. Stomach/Bowel: Sigmoid diverticulosis with bowel wall thickening and severe surrounding inflammatory changes most consistent with acute diverticulitis.  The previously seen small amount of extraluminal air adjacent to the sigmoid colon is significantly larger measuring approximately 4.9 x 5.6 cm. Small volume pneumoperitoneum new compared with the prior examination. Vascular/Lymphatic: Normal caliber  abdominal aorta. No lymphadenopathy. Reproductive: Normal uterus.  No adnexal mass. Musculoskeletal: No lytic or sclerotic osseous lesion. No acute osseous abnormality. IMPRESSION: 1. Perforated sigmoid diverticulitis which has worsened compared with 02/20/2015. There is now small volume pneumoperitoneum. Previously seen small amount of contained extraluminal air adjacent to the sigmoid colon is significantly larger measuring approximately 4.9 x 5.6 cm. 2. Small right pleural effusion. Bibasilar airspace disease likely reflecting atelectasis. These results were called by telephone at the time of interpretation on 02/25/2015 at 1:52 pm to Dr. Barkley Bruns, who verbally acknowledged these results. Electronically Signed   By: Kathreen Devoid   On: 02/25/2015 13:55   Ct Image Guided Drainage By Percutaneous Catheter  02/26/2015  CLINICAL DATA:  Sigmoid diverticulitis with enlarging diverticular abscess. The patient presents for percutaneous drainage. EXAM: CT GUIDED DRAINAGE OF PERITONEAL DIVERTICULAR ABSCESS ANESTHESIA/SEDATION: 2.0 Mg IV Versed 100 mcg IV Fentanyl Total Moderate Sedation Time:  20 minutes PROCEDURE: The procedure, risks, benefits, and alternatives were explained to the patient. Questions regarding the procedure were encouraged and answered. The patient understands and consents to the procedure. CT was performed in a supine position through the lower abdomen and pelvis. The anterior abdominal wall was prepped with Betadine in a sterile fashion, and a sterile drape was applied covering the operative field. A sterile gown and sterile gloves were used for the procedure. Local anesthesia was provided with 1% Lidocaine. Under CT guidance, an 18 gauge trocar needle was advanced to the level of the diverticular abscess from an anterior approach just to the right of the umbilicus. After needle advanced into the abscess, a guidewire was advanced. The percutaneous tract was dilated and a 12 French pigtail drainage  catheter advanced over the wire. The catheter was formed and the wire removed. Additional CT was performed to evaluate drain positioning. A sample of fluid was aspirated from the drain and sent for culture analysis. The drain was flushed and connected to a suction bulb. It was secured at the skin with a Prolene retention suture and StatLock device. COMPLICATIONS: None FINDINGS: After drainage catheter placement, purulent fluid was able to be aspirated. There is good return of purulent fluid after drain placement. Postprocedural CT also shows complete decompression of the component of air in the diverticular abscess. IMPRESSION: CT-guided percutaneous catheter drainage performed of a diverticular abscess. A 12 French drain was placed and attached to suction bulb drainage. Electronically Signed   By: Aletta Edouard M.D.   On: 02/26/2015 16:43    Labs:  CBC:  Recent Labs  02/25/15 0545 02/26/15 0700 02/27/15 0247 02/28/15 0610  WBC 8.1 9.9 7.8 7.2  HGB 9.4* 7.6* 8.5* 8.7*  HCT 29.1* 22.9* 26.1* 26.6*  PLT 341 368 372 414*    COAGS:  Recent Labs  02/21/15 1600 02/21/15 2322 02/26/15 0700  INR  --   --  1.27  APTT 34 55*  --     BMP:  Recent Labs  02/25/15 0545 02/26/15 0700 02/27/15 0247 02/28/15 0610  NA 139 137 139 139  K 3.6 3.6 3.8 3.9  CL 105 108 108 105  CO2 24 17* 22 22  GLUCOSE 118* 102* 93 92  BUN 6 6 5* <5*  CALCIUM 8.3* 8.1* 8.3* 8.4*  CREATININE 0.71 0.62 0.74 0.59  GFRNONAA >60 >60 >  60 >60  GFRAA >60 >60 >60 >60    LIVER FUNCTION TESTS:  Recent Labs  09/30/14 1301 02/20/15 1950 02/21/15 0547 02/25/15 0545  BILITOT 0.5 1.0 1.0 0.8  AST 23 21 11* 11*  ALT 12* 15 10* 9*  ALKPHOS 50 45 38 35*  PROT 6.7 7.2 6.1* 6.1*  ALBUMIN 3.4* 3.8 3.1* 2.8*    Assessment and Plan: Diverticular abscess S/p perc drain 1/13, improving clinically.  Electronically Signed: Ascencion Dike 02/28/2015, 10:34 AM   I spent a total of 15 Minutes at the the  patient's bedside AND on the patient's hospital floor or unit, greater than 50% of which was counseling/coordinating care for perc drain of diverticular abscess

## 2015-02-28 NOTE — Progress Notes (Signed)
Patient ID: Crystal Brennan, female   DOB: [birth year], 50 y.o.   MRN: [id number]  [facility] PROGRESS NOTE  BERNADEAN PARHAM [id number] DOB: March 27, 1965 DOA: 02/20/2015   PCP: Serita Grammes  Brief narrative:    50 yo patient with history of A. fib on Eliquis, HTN, OSA on nightly CPAP and recurrent diverticulitis (3 prior episodes) presented to the [facility] ED on 02/20/15 with complaints of 2 day history of abdominal pain similar to prior episodes of diverticulitis. No nausea or vomiting reported. Possible constipation-last p.m. 02/20/15. CT abdomen in ED confirmed sigmoid diverticulitis and microperforation. Treating conservatively. Surgeons following.  Assessment/Plan:    Perforated sigmoid diverticulitis with pneumoperitoneum - Treating conservatively with bowel rest (NPO except meds), IV fluids,  - Zosyn changed to Iv Invanz 1/12, Repeat CT 1/12 with worsening perf, clinically stable - further management per CCS - s/p perc drain per IR 1/13, FU Culture, improving clinically.  Hypokalemia - supplemented and WNL - BMP in MA  Anemia - Hb in 7-8 range now, no overt bleeding, on IV heparin  - monitor, transfuse if trends down further - no hemoperitoneum noted on last scan  Chronic diastolic CHF - Compensated. Monitor closely while being hydrated.   Proximal A. Fib - Anticoagulated on Eliquis at home- last dose 02/19/2015. Help prior to drain being placed  - Continue IV heparin and resume Eliquis in AM  - CHA2DS2-VASc score: at least 3. - Continue home dose of Cardizem & Metoprolol.   Essential hypertension - Controlled. Transient hypotension in ED which has resolved.   Morbid obesity/Body mass index is 46.81 kg/(m^2)/OSA - Continue nightly CPAP.  Tobacco abuse - quit in 11/16  Thrombocytosis - reactive, CBC in AM  History of anxiety and depression - on ativan as needed   H/o Pos MRSA nasal swab,  - repeat negative  DVT Prophylaxis - Currently on a  heparin drip. Resume Eliquis if no surgery planned   Code Status: Full.  Family Communication:  plan of care discussed with the patient   Disposition Plan: Home when cleared by surgery team   IV access:  Peripheral IV  Procedures and diagnostic studies:    Dg Abd 1 View 02/22/2015  Oral contrast seen within colon. Diverticulosis noted. No evidence of bowel obstruction.   Ct Abdomen Pelvis W Contrast 02/25/2015  Perforated sigmoid diverticulitis which has worsened compared with 02/20/2015. There is now small volume pneumoperitoneum. Previously seen small amount of contained extraluminal air adjacent to the sigmoid colon is significantly larger measuring approximately 4.9 x 5.6 cm. 2. Small right pleural effusion. Bibasilar airspace disease likely reflecting atelectasis. These results were called by telephone at the time of interpretation on 02/25/2015 at 1:52 pm to Dr. Barkley Bruns, who verbally acknowledged these results.   Ct Abdomen Pelvis W Contrast 02/20/2015 Sigmoid diverticulitis. There appears to be 2 sections of the mid sigmoid colon there are inflamed, both projecting in the right lower quadrant. Areas associated extraluminal air reflecting diverticular perforation, but no evidence of an abscess. 2. No other acute findings. 3. Multiple other noninflamed colonic diverticula. 4. Stable small hepatic cysts.  Ct Image Guided Drainage By Percutaneous Catheter 02/26/2015  CT-guided percutaneous catheter drainage performed of a diverticular abscess. A 12 French drain was placed and attached to suction bulb drainage.  Medical Consultants:  Surgery   Other Consultants:  PT  IAnti-Infectives:   Cipro 02/20/15 > 02/22/15 Flagyl 02/20/15 > 02/22/15 Zosyn 02/22/15 > 02/25/15 Ertapenem 02/25/15 >   Faye Ramsay, MD  [place] Pager [phone]  If 7PM-7AM, please contact night-coverage [email or web address] Password [email or web address] 02/28/2015, 5:29 PM   LOS: 8 days   HPI/Subjective: No events overnight.    Objective: Filed Vitals:   02/27/15 2256 02/28/15 0646 02/28/15 1004 02/28/15 1500  BP: 126/67 126/72 129/69 120/65  Pulse: 90 89 89 91  Temp: 98.8 F (37.1 C) 98.2 F (36.8 C)    TempSrc: Oral Oral  Oral  Resp: 18 19  18   Height:      Weight:      SpO2: 97% 98% 99% 96%    Intake/Output Summary (Last 24 hours) at 02/28/15 1729 Last data filed at 02/28/15 0451  Gross per 24 hour  Intake   1912 ml  Output     14 ml  Net   1898 ml    Exam:   General:  Pt is alert, follows commands appropriately, not in acute distress  Cardiovascular: Regular rate and rhythm,  no rubs, no gallops  Respiratory: Clear to auscultation bilaterally, no wheezing, diminished breath sounds at bases   Abdomen: Soft, non tender, bowel sounds present, no guarding   Data Reviewed: Basic Metabolic Panel:  Recent Labs Lab 02/24/15 0550 02/25/15 0545 02/26/15 0700 02/27/15 0247 02/28/15 0610  NA 138 139 137 139 139  K 3.5 3.6 3.6 3.8 3.9  CL 107 105 108 108 105  CO2 21* 24 17* 22 22  GLUCOSE 105* 118* 102* 93 92  BUN 7 6 6  5* <5*  CREATININE 0.69 0.71 0.62 0.74 0.59  CALCIUM 8.5* 8.3* 8.1* 8.3* 8.4*  MG 2.0  --   --   --   --    Liver Function Tests:  Recent Labs Lab 02/25/15 0545  AST 11*  ALT 9*  ALKPHOS 35*  BILITOT 0.8  PROT 6.1*  ALBUMIN 2.8*   CBC:  Recent Labs Lab 02/24/15 0550 02/25/15 0545 02/26/15 0700 02/27/15 0247 02/28/15 0610  WBC 8.5 8.1 9.9 7.8 7.2  HGB 10.1* 9.4* 7.6* 8.5* 8.7*  HCT 31.4* 29.1* 22.9* 26.1* 26.6*  MCV 81.1 82.4 80.1 81.8 81.6  PLT 344 341 368 372 414*   Recent Results (from the past 240 hour(s))  Surgical pcr screen     Status: Abnormal   Collection Time: 02/26/15  5:34 AM  Result Value Ref Range Status   MRSA, PCR NEGATIVE NEGATIVE Final   Staphylococcus aureus POSITIVE (A) NEGATIVE Final    Comment:        The Xpert SA Assay (FDA approved for NASAL specimens in patients over 34 years of age), is one component of a  comprehensive surveillance program.  Test performance has been validated by [facility] for patients greater than or equal to 65 year old. It is not intended to diagnose infection nor to guide or monitor treatment.   Culture, routine-abscess     Status: None (Preliminary result)   Collection Time: 02/26/15  3:19 PM  Result Value Ref Range Status   Specimen Description ABSCESS DIVERTICULAR  Final   Special Requests NONE  Final   Gram Stain   Final    ABUNDANT WBC PRESENT,BOTH PMN AND MONONUCLEAR NO SQUAMOUS EPITHELIAL CELLS SEEN NO ORGANISMS SEEN Performed at [facility]    Culture   Final    NO GROWTH 1 DAY Performed at [facility]    Report Status PENDING  Incomplete  Anaerobic culture     Status: None (Preliminary result)   Collection Time: 02/26/15  3:19 PM  Result Value Ref  Range Status   Specimen Description ABSCESS DIVERTICULAR  Final   Special Requests NONE  Final   Gram Stain   Final    ABUNDANT WBC PRESENT,BOTH PMN AND MONONUCLEAR NO SQUAMOUS EPITHELIAL CELLS SEEN NO ORGANISMS SEEN Performed at [facility]    Culture   Final    NO ANAEROBES ISOLATED; CULTURE IN PROGRESS FOR 5 DAYS Performed at [facility]    Report Status PENDING  Incomplete     Scheduled Meds: . diltiazem  180 mg Oral Daily  . ertapenem  1 g Intravenous Q24H  . metoprolol  50 mg Oral BID  . ranitidine  150 mg Oral BID  . sodium chloride  3 mL Intravenous Q12H  . zolpidem  5 mg Oral QHS   Continuous Infusions: . heparin 2,200 Units/hr (02/28/15 0945)  . 0.9 % sodium chloride with kcl 100 mL/hr at 02/28/15 1310

## 2015-03-01 ENCOUNTER — Inpatient Hospital Stay (HOSPITAL_COMMUNITY): Payer: Commercial Managed Care - PPO

## 2015-03-01 DIAGNOSIS — K5720 Diverticulitis of large intestine with perforation and abscess without bleeding: Principal | ICD-10-CM

## 2015-03-01 LAB — BASIC METABOLIC PANEL
Anion gap: 10 (ref 5–15)
CHLORIDE: 105 mmol/L (ref 101–111)
CO2: 24 mmol/L (ref 22–32)
CREATININE: 0.61 mg/dL (ref 0.44–1.00)
Calcium: 8.5 mg/dL — ABNORMAL LOW (ref 8.9–10.3)
GFR calc Af Amer: >60 mL/min (ref >60)
GFR calc non Af Amer: >60 mL/min (ref >60)
Glucose, Bld: 95 mg/dL (ref 65–99)
Potassium: 4 mmol/L (ref 3.5–5.1)
SODIUM: 139 mmol/L (ref 135–145)

## 2015-03-01 LAB — HEPATIC FUNCTION PANEL
ALT: 24 U/L (ref 14–54)
AST: 19 U/L (ref 15–41)
Albumin: 2.7 g/dL — ABNORMAL LOW (ref 3.5–5.0)
Alkaline Phosphatase: 35 U/L — ABNORMAL LOW (ref 38–126)
TOTAL PROTEIN: 6.4 g/dL — AB (ref 6.5–8.1)
Total Bilirubin: 0.6 mg/dL (ref 0.3–1.2)

## 2015-03-01 LAB — CULTURE, ROUTINE-ABSCESS: CULTURE: NO GROWTH

## 2015-03-01 LAB — CBC
HEMATOCRIT: 27 % — AB (ref 36.0–46.0)
Hemoglobin: 8.9 g/dL — ABNORMAL LOW (ref 12.0–15.0)
MCH: 26.6 pg (ref 26.0–34.0)
MCHC: 33 g/dL (ref 30.0–36.0)
MCV: 80.6 fL (ref 78.0–100.0)
Platelets: 468 10*3/uL — ABNORMAL HIGH (ref 150–400)
RBC: 3.35 MIL/uL — ABNORMAL LOW (ref 3.87–5.11)
RDW: 15.1 % (ref 11.5–15.5)
WBC: 6.5 10*3/uL (ref 4.0–10.5)

## 2015-03-01 LAB — GLUCOSE, CAPILLARY: GLUCOSE-CAPILLARY: 89 mg/dL (ref 65–99)

## 2015-03-01 LAB — PREALBUMIN: Prealbumin: 5.9 mg/dL — ABNORMAL LOW (ref 18–38)

## 2015-03-01 LAB — HEPARIN LEVEL (UNFRACTIONATED): Heparin Unfractionated: 0.53 IU/mL (ref 0.30–0.70)

## 2015-03-01 MED ORDER — IOHEXOL 300 MG/ML  SOLN
100.0000 mL | Freq: Once | INTRAMUSCULAR | Status: AC | PRN
Start: 2015-03-01 — End: 2015-03-01
  Administered 2015-03-01: 100 mL via INTRAVENOUS

## 2015-03-01 MED ORDER — IOHEXOL 300 MG/ML  SOLN: 25.0000 mL | INTRAMUSCULAR | Status: DC

## 2015-03-01 MED ORDER — INSULIN ASPART 100 UNIT/ML ~~LOC~~ SOLN
0.0000 [IU] | Freq: Four times a day (QID) | SUBCUTANEOUS | Status: DC
  Administered 2015-03-02 – 2015-03-04 (×5): 1 [IU] via SUBCUTANEOUS

## 2015-03-01 MED ORDER — TRACE MINERALS CR-CU-MN-SE-ZN 10-1000-500-60 MCG/ML IV SOLN
INTRAVENOUS | Status: AC
  Administered 2015-03-01: 18:00:00 via INTRAVENOUS
  Filled 2015-03-01: qty 960

## 2015-03-01 MED ORDER — FAT EMULSION 20 % IV EMUL
240.0000 mL | INTRAVENOUS | Status: AC
Start: 2015-03-01 — End: 2015-03-02
  Administered 2015-03-01: 240 mL via INTRAVENOUS
  Filled 2015-03-01: qty 250

## 2015-03-01 MED ORDER — SODIUM CHLORIDE 0.9 % IV SOLN
INTRAVENOUS | Status: DC
  Administered 2015-03-01: 22:00:00 via INTRAVENOUS
  Filled 2015-03-01 (×3): qty 1000

## 2015-03-01 NOTE — Progress Notes (Signed)
ANTICOAGULATION CONSULT NOTE - Follow Up Consult  Pharmacy Consult for Heparin Indication: bridge therapy for Afib while off Apixaban  Allergies  Allergen Reactions  . Benadryl [Diphenhydramine Hcl] Hives and Itching  . Codeine Nausea And Vomiting  . Dilaudid [Hydromorphone Hcl]     "feels like she dying '  . Percocet [Oxycodone-Acetaminophen] Hives and Itching   Patient Measurements: Height: 5\' 2"  (157.5 cm) Weight: 256 lb (116.121 kg) IBW/kg (Calculated) : 50.1 HEPARIN DW (KG): 78.7  Vital Signs: Temp: 98.1 F (36.7 C) (01/16 0637) Temp Source: Oral (01/16 0637) BP: 146/69 mmHg (01/16 0637) Pulse Rate: 98 (01/16 0637)  Labs:  Recent Labs  02/27/15 0247 02/27/15 1150 02/28/15 0610 03/01/15 0705  HGB 8.5*  --  8.7* 8.9*  HCT 26.1*  --  26.6* 27.0*  PLT 372  --  414* 468*  HEPARINUNFRC 0.30 0.39 0.45 0.53  CREATININE 0.74  --  0.59 0.61    Estimated Creatinine Clearance: 100.5 mL/min (by C-G formula based on Cr of 0.61).  Medications:  Infusions:  . heparin 2,200 Units/hr (03/01/15 0819)  . 0.9 % sodium chloride with kcl 100 mL/hr at 03/01/15 0004  PTA: apixaban 5mg  BID LD 1/6@ 2100  Assessment: 50 yo F admitted with sigmoid diverticulitis and microperforation currently being treated conservatively with bowel rest and IV antibiotics.  She is on chronic Apixaban for hx Afib which was held on admission.  Pharmacy asked to manage heparin bridge while off apixaban.  Heparin held 1/13 for IR drain placement, Heparin resumption delayed by loss of IV access.   PICC placed 1/14  03/01/2015:  Heparin level continues to be therapeutic on current rate of 2200 units/hr  CBC stable  No reported bleeding except slight vaginal bleeding overnight 1/13.  Goal of Therapy:  Heparin level 0.3-0.7 units/ml Monitor platelets by anticoagulation protocol: Yes   Plan:   Continue Heparin at 2200 units/hr  Noted plan per Md note to change IV heparin back to Eliquis this AM  - will continue to follow  Daily Heparin level, CBC   Adrian Saran, PharmD, BCPS Pager [phone] 03/01/2015 9:00 AM

## 2015-03-01 NOTE — Progress Notes (Signed)
  Subjective: She isn't really any better than last week before drain placement.  It still hurts for her to void, she also reports some blood with voiding.  No air noted with voiding.  She says she will feel better and then the pain comes back.   She is not eating and says she has cramps with liquids.  She has not eaten since 02/20/15.   Objective: Vital signs in last 24 hours: Temp:  [97.1 F (36.2 C)-98.4 F (36.9 C)] 98.1 F (36.7 C) (01/16 0637) Pulse Rate:  [89-98] 98 (01/16 0637) Resp:  [18-19] 18 (01/16 0637) BP: (120-146)/(65-76) 146/69 mmHg (01/16 0637) SpO2:  [96 %-99 %] 99 % (01/16 0637) Last BM Date: 02/28/15 PO 120 recorded DRain:  20 ml Afebrile, VSS Labs OK, done twice this AM  She has never generated an elevated WBC with this  Intake/Output from previous day: 01/15 0701 - 01/16 0700 In: 1280 [P.O.:120; I.V.:1100; IV Piggyback:50] Out: 20 [Drains:20] Intake/Output this shift:    General appearance: alert, cooperative and no distress GI: still tender RLQ, hyperacitive BS.    Lab Results:   Recent Labs  02/28/15 0610 03/01/15 0705  WBC 7.2 6.5  HGB 8.7* 8.9*  HCT 26.6* 27.0*  PLT 414* 468*    BMET  Recent Labs  02/28/15 0610 03/01/15 0705  NA 139 139  K 3.9 4.0  CL 105 105  CO2 22 24  GLUCOSE 92 95  BUN <5* <5*  CREATININE 0.59 0.61  CALCIUM 8.4* 8.5*   PT/INR No results for input(s): LABPROT, INR in the last 72 hours.   Recent Labs Lab 02/25/15 0545  AST 11*  ALT 9*  ALKPHOS 35*  BILITOT 0.8  PROT 6.1*  ALBUMIN 2.8*     Lipase     Component Value Date/Time   LIPASE 19 02/20/2015 1950     Studies/Results: No results found.  Medications: . diltiazem  180 mg Oral Daily  . ertapenem  1 g Intravenous Q24H  . metoprolol  50 mg Oral BID  . ranitidine  150 mg Oral BID  . sodium chloride  3 mL Intravenous Q12H  . zolpidem  5 mg Oral QHS   . heparin 2,200 Units/hr (03/01/15 0819)  . 0.9 % sodium chloride with kcl 100 mL/hr at  03/01/15 0004   Specimen Description ABSCESS DIVERTICULAR Aspirate from 02/26/15   Special Requests NONE   Gram Stain ABUNDANT WBC PRESENT,BOTH PMN AND MONONUCLEAR  NO SQUAMOUS EPITHELIAL CELLS SEEN  NO ORGANISMS SEEN  Performed at [facility]       Culture NO GROWTH 2 DAYS            Assessment/Plan Recurrent diverticulitis IR drain placement 02/26/15 with purulent fluid drained Hx of AF on Eliquis, last dose 02/19/15 Hypertension Obesity, BMI 47 Malnutrition Tobacco use, quit 12/2014 Hx of anxiety/depression Chronic back pain Hx of migraine Hx of arthritis Antibiotics: Day 3 Flagyl, day 2 Cipro completed, 4 days of Zosyn, Day 5 of Invanz DVT: Heparin drip/SCD  Plan:  I am going to get a film and see if she has some obstruction, her bowel sounds are very hyperactive, not much in the way of flatus, she did have a small BM yesterday, but not recorded.  I have ordered TNA, LFT's and a prealbumin.  I see no real improvement from last week.   I will discuss with Dr. Dalbert Batman.      LOS: 9 days    Crystal Brennan 03/01/2015

## 2015-03-01 NOTE — Progress Notes (Signed)
PARENTERAL NUTRITION CONSULT NOTE - INITIAL  Pharmacy Consult for TPN Indication: prolonged malnutrition, possible bowel obstruction  Allergies  Allergen Reactions  . Benadryl [Diphenhydramine Hcl] Hives and Itching  . Codeine Nausea And Vomiting  . Dilaudid [Hydromorphone Hcl]     "feels like she dying '  . Percocet [Oxycodone-Acetaminophen] Hives and Itching    Patient Measurements: Height: 5\' 2"  (157.5 cm) Weight: 256 lb (116.121 kg) IBW/kg (Calculated) : 50.1 Adjusted Body Weight: 67 kg  Vital Signs: Temp: 98.1 F (36.7 C) (01/16 0637) Temp Source: Oral (01/16 0637) BP: 146/69 mmHg (01/16 0637) Pulse Rate: 98 (01/16 0637) Intake/Output from previous day: 01/15 0701 - 01/16 0700 In: 1280 [P.O.:120; I.V.:1100; IV Piggyback:50] Out: 20 [Drains:20] Intake/Output from this shift:    Labs:  Recent Labs  02/27/15 0247 02/28/15 0610 03/01/15 0705  WBC 7.8 7.2 6.5  HGB 8.5* 8.7* 8.9*  HCT 26.1* 26.6* 27.0*  PLT 372 414* 468*     Recent Labs  02/27/15 0247 02/28/15 0610 03/01/15 0705  NA 139 139 139  K 3.8 3.9 4.0  CL 108 105 105  CO2 22 22 24   GLUCOSE 93 92 95  BUN 5* <5* <5*  CREATININE 0.74 0.59 0.61  CALCIUM 8.3* 8.4* 8.5*   Estimated Creatinine Clearance: 100.5 mL/min (by C-G formula based on Cr of 0.61).   No results for input(s): GLUCAP in the last 72 hours.  Medical History: Past Medical History  Diagnosis Date  . Chronic back pain     "upper" (01/01/2014)  . Hypertension   . Depression   . Anxiety   . Anemia   . Menometrorrhagia   . Insomnia   . Diverticulitis   . Sickle cell trait ([place])   . Cardiomegaly     a. 2011 Echo: EF 65-70%, Gr 1 DD, nl wall motion.  . Morbid obesity ([place])   . Tobacco abuse   . Palpitations     a. Date back to 08/2013.  . Diverticulitis     a. 09/2013  . Family history of adverse reaction to anesthesia     "Mom gets so sick off of it"  . Heart murmur   . Chronic bronchitis ([place])     "get it ~ q yr"  (01/01/2014)  . GERD (gastroesophageal reflux disease)   . Headache     "monthly" (01/01/2014)  . Migraine     "monthly" (01/01/2014)  . Arthritis     "qwhere"    Medications:  Scheduled:  . diltiazem  180 mg Oral Daily  . ertapenem  1 g Intravenous Q24H  . metoprolol  50 mg Oral BID  . ranitidine  150 mg Oral BID  . sodium chloride  3 mL Intravenous Q12H  . zolpidem  5 mg Oral QHS   Infusions:  . heparin 2,200 Units/hr (03/01/15 0819)  . 0.9 % sodium chloride with kcl 100 mL/hr at 03/01/15 0004    Insulin Requirements: n/a  Current Nutrition: CL - not eating  IVF: NS with 18mEq KCL at 100 ml/hr  Central access: PICC TPN start date: 1/16  ASSESSMENT  HPI: Patient known to pharmacy for dosing of IV heparin. Per CCS, patient not improving since drain placement last week and has not eaten since 1/7. Plan per CCS to check for obstruction and start TPN  Significant events:   Today, 03/01/2015  Glucose - blood gluc WNL  Electrolytes - stable  Renal - SCr stable  LFTs - low on 1/12  TGs - ordered  Prealbumin - 5.8 (1/12)  NUTRITIONAL GOALS                                                                                             RD recs: pending  Clinimix E 5/15 at a goal rate of 38ml/hr + 20% fat emulsion at 82ml/hr to provide: 100g/day protein, 1919Kcal/day. Note used adjusted body weight for calculations due to obesity  PLAN                                                                                                                         At 1800 today:  Start Clinimix E 5/15 at 58ml/hr.  20% fat emulsion at 13ml/hr.  Plan to advance as tolerated to the goal rate.  TPN to contain standard multivitamins and trace elements.  Reduce IVF to 18ml/hr.  Add CBG/SSI q6.   TPN lab panels on Mondays & Thursdays.  F/u daily.   Adrian Saran,  PharmD, BCPS Pager [phone] 03/01/2015 10:24 AM

## 2015-03-01 NOTE — Evaluation (Signed)
Physical Therapy Evaluation Patient Details Name: Crystal Brennan MRN: [id number] DOB: April 09, 1965 Today's Date: 03/01/2015   History of Present Illness  50 y.o. female with h/o obesity, CHF, a fib admitted with diverticulitis of colon with perforation. S/P abscess drainage 02/26/15.   Clinical Impression  Pt admitted with above diagnosis. Pt currently with functional limitations due to the deficits listed below (see PT Problem List). Pt ambulated 150' holding IV pole and hand held assist of 1, she declined use of RW. Distance limited by 7/10 abdominal pain and nausea. Instructed pt in seated LE exercises for strengthening.  Pt will benefit from skilled PT to increase their independence and safety with mobility to allow discharge to the venue listed below.       Follow Up Recommendations Supervision for mobility/OOB;No PT follow up    Equipment Recommendations  Rolling walker with 5" wheels    Recommendations for Other Services       Precautions / Restrictions Precautions Precautions: Fall Precaution Comments: abdominal drain Restrictions Weight Bearing Restrictions: No      Mobility  Bed Mobility Overal bed mobility: Needs Assistance Bed Mobility: Supine to Sit     Supine to sit: Min assist     General bed mobility comments: instructed pt in log roll, she stated this technique was too painful, she prefers to raise HOB to 60* and use rail and min HHA to get to EOB  Transfers Overall transfer level: Needs assistance Equipment used: 1 person hand held assist Transfers: Sit to/from Stand Sit to Stand: Min assist         General transfer comment: min A to rise from low commode with HHA of 1 and IV pole, pt declined RW  Ambulation/Gait Ambulation/Gait assistance: Supervision Ambulation Distance (Feet): 150 Feet Assistive device: 1 person hand held assist (HHA of 1 plus handrail, pt tried RW but didn't like it) Gait Pattern/deviations: Step-through pattern;Decreased step  length - right;Decreased step length - left   Gait velocity interpretation: Below normal speed for age/gender General Gait Details: steady with RW, decr cadence, distance limited by pain/fatigue/nausea  Stairs            Wheelchair Mobility    Modified Rankin (Stroke Patients Only)       Balance Overall balance assessment: Needs assistance   Sitting balance-Leahy Scale: Good     Standing balance support: Single extremity supported Standing balance-Leahy Scale: Poor Standing balance comment: limited by pain                             Pertinent Vitals/Pain Pain Assessment: 0-10 Pain Score: 7  Pain Location: lower R abdomen Pain Descriptors / Indicators: Sore Pain Intervention(s): Premedicated before session;Monitored during session;Limited activity within patient's tolerance    Home Living                        Prior Function                 Hand Dominance        Extremity/Trunk Assessment   Upper Extremity Assessment: Overall WFL for tasks assessed           Lower Extremity Assessment: RLE deficits/detail RLE Deficits / Details: R knee extension -4/5, limited by pain    Cervical / Trunk Assessment: Normal  Communication      Cognition Arousal/Alertness: Awake/alert Behavior During Therapy: WFL for tasks assessed/performed Overall Cognitive Status: Within Functional  Limits for tasks assessed                      General Comments      Exercises   Demonstrated LAQs, seated marching and ankle pumps, pt too tired to attempt.     Assessment/Plan    PT Assessment Patient needs continued PT services  PT Diagnosis Difficulty walking;Acute pain   PT Problem List Decreased strength;Decreased activity tolerance;Decreased balance;Pain;Decreased mobility  PT Treatment Interventions DME instruction;Gait training;Stair training;Functional mobility training;Therapeutic activities;Therapeutic exercise   PT Goals  (Current goals can be found in the Care Plan section) Acute Rehab PT Goals Patient Stated Goal: to sing PT Goal Formulation: With patient/family Time For Goal Achievement: 03/15/15 Potential to Achieve Goals: Good    Frequency Min 3X/week   Barriers to discharge        Co-evaluation               End of Session Equipment Utilized During Treatment: Gait belt Activity Tolerance: Patient limited by fatigue;Patient limited by pain Patient left: in bed;with call bell/phone within reach;with family/visitor present Nurse Communication: Mobility status         Time: [id number] PT Time Calculation (min) (ACUTE ONLY): 24 min   Charges:   PT Evaluation $PT Eval Low Complexity: 1 Procedure PT Treatments $Gait Training: 8-22 mins   PT G Codes:        [name] 03/01/2015, 2:25 PM [phone]

## 2015-03-01 NOTE — Care Management Note (Signed)
Case Management Note  Patient Details  Name: Crystal Brennan MRN: [id number] Date of Birth: 1965-05-27  Subjective/Objective:    50 y/o f admitted w/diverticulitis. From home. PT-recc RW.                Action/Plan:d/c plan home.   Expected Discharge Date:                  Expected Discharge Plan:  Home/Self Care  In-House Referral:     Discharge planning Services  CM Consult  Post Acute Care Choice:    Choice offered to:     DME Arranged:    DME Agency:     HH Arranged:    HH Agency:     Status of Service:  In process, will continue to follow  Medicare Important Message Given:    Date Medicare IM Given:    Medicare IM give by:    Date Additional Medicare IM Given:    Additional Medicare Important Message give by:     If discussed at [place] of Stay Meetings, dates discussed:    Additional Comments:  Dessa Phi, RN 03/01/2015, 4:24 PM

## 2015-03-01 NOTE — Progress Notes (Signed)
Patient ID: Crystal Brennan, female   DOB: 14-Jan-1966, 50 y.o.   MRN: [id number]    Referring Physician(s): CCS  Chief Complaint:  Diverticular abscess  Subjective:  Pt drowsy but arousable; still having some abd/pelvic discomfort/cramping, occ nausea,+flatus, minimal BM's; not eating.  Allergies: Benadryl; Codeine; Dilaudid; and Percocet  Medications: Prior to Admission medications   Medication Sig Start Date End Date Taking? Authorizing Provider  apixaban (ELIQUIS) 5 MG TABS tablet Take 1 tablet (5 mg total) by mouth 2 (two) times daily. 01/26/15  Yes Lelon Perla, MD  Aspirin Effervescent (ALKA-SELTZER PO) Take 2 tablets by mouth 2 (two) times daily as needed (stomach pains).   Yes Historical Provider, MD  Chlorpheniramine Maleate (CHLOR-TRIMETON PO) Take 2 tablets by mouth daily as needed (allergies).   Yes Historical Provider, MD  diltiazem (CARDIZEM CD) 180 MG 24 hr capsule Take 1 capsule (180 mg total) by mouth daily. 01/26/15  Yes Lelon Perla, MD  hydrochlorothiazide (HYDRODIURIL) 25 MG tablet Take 1 tablet (25 mg total) by mouth daily. 01/26/15  Yes Lelon Perla, MD  ibuprofen (ADVIL,MOTRIN) 200 MG tablet Take 800 mg by mouth every 6 (six) hours as needed for moderate pain or cramping.   Yes Historical Provider, MD  metoprolol (LOPRESSOR) 50 MG tablet Take 1 tablet (50 mg total) by mouth 2 (two) times daily. 01/26/15  Yes Lelon Perla, MD  ketorolac (TORADOL) 10 MG tablet Take 1 tablet (10 mg total) by mouth every 6 (six) hours as needed. 04/18/14   Larene Pickett, PA-C     Vital Signs: BP 146/69 mmHg  Pulse 98  Temp(Src) 98.1 F (36.7 C) (Oral)  Resp 18  Ht 5\' 2"  (1.575 m)  Wt 256 lb (116.121 kg)  BMI 46.81 kg/m2  SpO2 99%  LMP 02/22/2015  Physical Exam RLQ drain intact, insertion site ok, mildly tender; drain irrigated with 5 cc sterile NS with return of same; cx's neg  Imaging: Dg Abd 2 Views  03/01/2015  CLINICAL DATA:  Acute diverticulitis  EXAM: ABDOMEN - 2 VIEW COMPARISON:  02/26/2015 and 02/22/2015 FINDINGS: There is normal small bowel gas pattern. A drainage catheter is noted in right lower abdomen. Some colonic gas noted throughout the colon without colonic distension. Postcholecystectomy surgical clips are noted. IMPRESSION: Normal small bowel gas pattern. Some colonic gas noted throughout the colon. No significant colonic distension. Drainage catheter is noted in right lower abdomen Electronically Signed   By: Lahoma Crocker M.D.   On: 03/01/2015 12:07   Ct Image Guided Drainage By Percutaneous Catheter  02/26/2015  CLINICAL DATA:  Sigmoid diverticulitis with enlarging diverticular abscess. The patient presents for percutaneous drainage. EXAM: CT GUIDED DRAINAGE OF PERITONEAL DIVERTICULAR ABSCESS ANESTHESIA/SEDATION: 2.0 Mg IV Versed 100 mcg IV Fentanyl Total Moderate Sedation Time:  20 minutes PROCEDURE: The procedure, risks, benefits, and alternatives were explained to the patient. Questions regarding the procedure were encouraged and answered. The patient understands and consents to the procedure. CT was performed in a supine position through the lower abdomen and pelvis. The anterior abdominal wall was prepped with Betadine in a sterile fashion, and a sterile drape was applied covering the operative field. A sterile gown and sterile gloves were used for the procedure. Local anesthesia was provided with 1% Lidocaine. Under CT guidance, an 18 gauge trocar needle was advanced to the level of the diverticular abscess from an anterior approach just to the right of the umbilicus. After needle advanced into the abscess, a guidewire was  advanced. The percutaneous tract was dilated and a 12 French pigtail drainage catheter advanced over the wire. The catheter was formed and the wire removed. Additional CT was performed to evaluate drain positioning. A sample of fluid was aspirated from the drain and sent for culture analysis. The drain was flushed and  connected to a suction bulb. It was secured at the skin with a Prolene retention suture and StatLock device. COMPLICATIONS: None FINDINGS: After drainage catheter placement, purulent fluid was able to be aspirated. There is good return of purulent fluid after drain placement. Postprocedural CT also shows complete decompression of the component of air in the diverticular abscess. IMPRESSION: CT-guided percutaneous catheter drainage performed of a diverticular abscess. A 12 French drain was placed and attached to suction bulb drainage. Electronically Signed   By: Aletta Edouard M.D.   On: 02/26/2015 16:43    Labs:  CBC:  Recent Labs  02/26/15 0700 02/27/15 0247 02/28/15 0610 03/01/15 0705  WBC 9.9 7.8 7.2 6.5  HGB 7.6* 8.5* 8.7* 8.9*  HCT 22.9* 26.1* 26.6* 27.0*  PLT 368 372 414* 468*    COAGS:  Recent Labs  02/21/15 1600 02/21/15 2322 02/26/15 0700  INR  --   --  1.27  APTT 34 55*  --     BMP:  Recent Labs  02/26/15 0700 02/27/15 0247 02/28/15 0610 03/01/15 0705  NA 137 139 139 139  K 3.6 3.8 3.9 4.0  CL 108 108 105 105  CO2 17* 22 22 24   GLUCOSE 102* 93 92 95  BUN 6 5* <5* <5*  CALCIUM 8.1* 8.3* 8.4* 8.5*  CREATININE 0.62 0.74 0.59 0.61  GFRNONAA >60 >60 >60 >60  GFRAA >60 >60 >60 >60    LIVER FUNCTION TESTS:  Recent Labs  02/20/15 1950 02/21/15 0547 02/25/15 0545 03/01/15 1110  BILITOT 1.0 1.0 0.8 0.6  AST 21 11* 11* 19  ALT 15 10* 9* 24  ALKPHOS 45 38 35* 35*  PROT 7.2 6.1* 6.1* 6.4*  ALBUMIN 3.8 3.1* 2.8* 2.7*    Assessment and Plan: S/p drainage of RLQ diverticular abscess 1/13; afebrile; WBC 6.5, HGB 8.9, creat .61, fluid cx's neg; abd plain film today- no acute process; CCS ordered f/u CT A/P for today- will review   Electronically Signed: D. Rowe Robert 03/01/2015, 2:45 PM   I spent a total of 15 minutes at the the patient's bedside AND on the patient's hospital floor or unit, greater than 50% of which was counseling/coordinating care  for pelvic abscess drain

## 2015-03-01 NOTE — Progress Notes (Signed)
Patient ID: Crystal Brennan, female   DOB: 1965/10/31, 50 y.o.   MRN: [id number]  [facility] PROGRESS NOTE  Crystal Brennan [id number] DOB: 22-Nov-1965 DOA: 02/20/2015   PCP: Serita Grammes  Brief narrative:    50 yo patient with history of A. fib on Eliquis, HTN, OSA on nightly CPAP and recurrent diverticulitis (3 prior episodes) presented to the [facility] ED on 02/20/15 with complaints of 2 day history of abdominal pain similar to prior episodes of diverticulitis. No nausea or vomiting reported. Possible constipation-last p.m. 02/20/15. CT abdomen in ED confirmed sigmoid diverticulitis and microperforation. Treating conservatively. Surgeons following.  Assessment/Plan:    Perforated sigmoid diverticulitis with pneumoperitoneum - Treating conservatively with bowel rest (NPO except meds), IV fluids, general surgery to make a decision about surgical intervention - Zosyn changed to Iv Invanz 1/12, Repeat CT 1/12 Previously seen small amount of contained extraluminal air adjacent to the sigmoid colon is significantly larger measuring 4.9 x 5.6 cm. - further management per CCS - s/p perc drain per IR 1/13, FU Culture, improving clinically. On clear liquid diet , surgery to advance   Hypokalemia - supplemented and WNL - BMP in MA  Anemia - Hb in 7-8 range now, no overt bleeding, on IV heparin  - monitor, transfuse if trends down further - no hemoperitoneum noted on last scan  Chronic diastolic CHF - Compensated. Monitor closely while being hydrated.   Proximal A. Fib - Anticoagulated on Eliquis at home- last dose 02/19/2015. Help prior to drain being placed  - Continue IV heparin and resume Eliquis in AM  - CHA2DS2-VASc score: at least 3. - Continue home dose of Cardizem & Metoprolol.   Essential hypertension - Controlled. Transient hypotension in ED which has resolved.   Morbid obesity/Body mass index is 46.81 kg/(m^2)/OSA - Continue nightly CPAP.  Tobacco abuse -  quit in 11/16  Thrombocytosis - reactive, CBC in AM  History of anxiety and depression - on ativan as needed   H/o Pos MRSA nasal swab,  - repeat negative  DVT Prophylaxis - Currently on a heparin drip. Resume Eliquis if no surgery planned   Code Status: Full.  Family Communication:  plan of care discussed with the patient   Disposition Plan: Home when cleared by surgery team   IV access:  Peripheral IV  Procedures and diagnostic studies:    Dg Abd 1 View 02/22/2015  Oral contrast seen within colon. Diverticulosis noted. No evidence of bowel obstruction.   Ct Abdomen Pelvis W Contrast 02/25/2015  Perforated sigmoid diverticulitis which has worsened compared with 02/20/2015. There is now small volume pneumoperitoneum. Previously seen small amount of contained extraluminal air adjacent to the sigmoid colon is significantly larger measuring approximately 4.9 x 5.6 cm. 2. Small right pleural effusion. Bibasilar airspace disease likely reflecting atelectasis. These results were called by telephone at the time of interpretation on 02/25/2015 at 1:52 pm to Dr. Barkley Bruns, who verbally acknowledged these results.   Ct Abdomen Pelvis W Contrast 02/20/2015 Sigmoid diverticulitis. There appears to be 2 sections of the mid sigmoid colon there are inflamed, both projecting in the right lower quadrant. Areas associated extraluminal air reflecting diverticular perforation, but no evidence of an abscess. 2. No other acute findings. 3. Multiple other noninflamed colonic diverticula. 4. Stable small hepatic cysts.  Ct Image Guided Drainage By Percutaneous Catheter 02/26/2015  CT-guided percutaneous catheter drainage performed of a diverticular abscess. A 12 French drain was placed and attached to suction bulb drainage.  Medical  Consultants:  Surgery   Other Consultants:  PT  IAnti-Infectives:   Cipro 02/20/15 > 02/22/15 Flagyl 02/20/15 > 02/22/15 Zosyn 02/22/15 > 02/25/15 Ertapenem 02/25/15 >    Reyne Dumas, MD  [facility] Pager [phone]  If 7PM-7AM, please contact night-coverage [email or web address] Password [email or web address] 03/01/2015, 8:49 AM   LOS: 9 days   HPI/Subjective: Continues to have abdominal cramping, denies nausea vomiting had a small BM yesterday  Objective: Filed Vitals:   02/28/15 1500 02/28/15 2042 02/28/15 2124 03/01/15 0637  BP: 120/65  137/76 146/69  Pulse: 91 92 95 98  Temp: 97.1 F (36.2 C)  98.4 F (36.9 C) 98.1 F (36.7 C)  TempSrc: Oral  Oral Oral  Resp: 18 18 19 18   Height:      Weight:      SpO2: 96% 97% 99% 99%    Intake/Output Summary (Last 24 hours) at 03/01/15 0849 Last data filed at 02/28/15 2113  Gross per 24 hour  Intake   1280 ml  Output     20 ml  Net   1260 ml    Exam:   General:  Pt is alert, follows commands appropriately, not in acute distress  Cardiovascular: Regular rate and rhythm,  no rubs, no gallops  Respiratory: Clear to auscultation bilaterally, no wheezing, diminished breath sounds at bases   Abdomen: Soft, non tender, bowel sounds present, no guarding   Data Reviewed: Basic Metabolic Panel:  Recent Labs Lab 02/24/15 0550 02/25/15 0545 02/26/15 0700 02/27/15 0247 02/28/15 0610 03/01/15 0705  NA 138 139 137 139 139 139  K 3.5 3.6 3.6 3.8 3.9 4.0  CL 107 105 108 108 105 105  CO2 21* 24 17* 22 22 24   GLUCOSE 105* 118* 102* 93 92 95  BUN 7 6 6  5* <5* <5*  CREATININE 0.69 0.71 0.62 0.74 0.59 0.61  CALCIUM 8.5* 8.3* 8.1* 8.3* 8.4* 8.5*  MG 2.0  --   --   --   --   --    Liver Function Tests:  Recent Labs Lab 02/25/15 0545  AST 11*  ALT 9*  ALKPHOS 35*  BILITOT 0.8  PROT 6.1*  ALBUMIN 2.8*   CBC:  Recent Labs Lab 02/25/15 0545 02/26/15 0700 02/27/15 0247 02/28/15 0610 03/01/15 0705  WBC 8.1 9.9 7.8 7.2 6.5  HGB 9.4* 7.6* 8.5* 8.7* 8.9*  HCT 29.1* 22.9* 26.1* 26.6* 27.0*  MCV 82.4 80.1 81.8 81.6 80.6  PLT 341 368 372 414* 468*   Recent Results (from the past 240 hour(s))  Surgical pcr  screen     Status: Abnormal   Collection Time: 02/26/15  5:34 AM  Result Value Ref Range Status   MRSA, PCR NEGATIVE NEGATIVE Final   Staphylococcus aureus POSITIVE (A) NEGATIVE Final    Comment:        The Xpert SA Assay (FDA approved for NASAL specimens in patients over 69 years of age), is one component of a comprehensive surveillance program.  Test performance has been validated by [facility] for patients greater than or equal to 5 year old. It is not intended to diagnose infection nor to guide or monitor treatment.   Culture, routine-abscess     Status: None   Collection Time: 02/26/15  3:19 PM  Result Value Ref Range Status   Specimen Description ABSCESS DIVERTICULAR  Final   Special Requests NONE  Final   Gram Stain   Final    ABUNDANT WBC PRESENT,BOTH PMN AND MONONUCLEAR NO SQUAMOUS EPITHELIAL CELLS SEEN NO ORGANISMS  SEEN Performed at [facility]    Culture   Final    NO GROWTH 2 DAYS Performed at [facility]    Report Status 03/01/2015 FINAL  Final  Anaerobic culture     Status: None (Preliminary result)   Collection Time: 02/26/15  3:19 PM  Result Value Ref Range Status   Specimen Description ABSCESS DIVERTICULAR  Final   Special Requests NONE  Final   Gram Stain   Final    ABUNDANT WBC PRESENT,BOTH PMN AND MONONUCLEAR NO SQUAMOUS EPITHELIAL CELLS SEEN NO ORGANISMS SEEN Performed at [facility]    Culture   Final    NO ANAEROBES ISOLATED; CULTURE IN PROGRESS FOR 5 DAYS Performed at [facility]    Report Status PENDING  Incomplete     Scheduled Meds: . diltiazem  180 mg Oral Daily  . ertapenem  1 g Intravenous Q24H  . metoprolol  50 mg Oral BID  . ranitidine  150 mg Oral BID  . sodium chloride  3 mL Intravenous Q12H  . zolpidem  5 mg Oral QHS   Continuous Infusions: . heparin 2,200 Units/hr (03/01/15 0819)  . 0.9 % sodium chloride with kcl 100 mL/hr at 03/01/15 0004

## 2015-03-01 NOTE — Progress Notes (Signed)
Initial Nutrition Assessment  DOCUMENTATION CODES:   Morbid obesity  INTERVENTION:  - TPN per pharmacy - RD will continue to monitor for needs  NUTRITION DIAGNOSIS:   Inadequate oral intake related to inability to eat as evidenced by other (see comment) (cramping and nausea with intakes, not eating much if at all since 02/20/15).  GOAL:   Patient will meet greater than or equal to 90% of their needs  MONITOR:   Weight trends, Labs, Skin, I & O's, Other (Comment) (TPN regimen)  REASON FOR ASSESSMENT:   Consult New TPN/TNA  ASSESSMENT:   50 yo patient with history of A. fib on Eliquis, HTN, OSA on nightly CPAP and recurrent diverticulitis (3 prior episodes) presented to the [facility] ED on 02/20/15 with complaints of 2 day history of abdominal pain similar to prior episodes of diverticulitis. No nausea or vomiting reported. Possible constipation-last p.m. 02/20/15. CT abdomen in ED confirmed sigmoid diverticulitis and microperforation. Treating conservatively. Surgeons following.  Pt seen for consult. BMI indicates morbid obesity. Pt very drowsy at time of RD visit and the majority of information was provided by her daughter, who is at bedside. Pt attempted to consume chicken broth yesterday but had severe cramping and was unable to consume more than a few spoonfuls. PTA pt had been trying to eat a more healthy diet and was also avoiding foods that she had previously been advised may lead to diverticulitis (as she has a hx of this). Despite these changes pt was experiencing severe cramping and associated nausea and vomiting with intakes for "a long time."   Family denies pt having any emesis since admission but she has been complaining of ongoing cramping/abdominal pain even without intakes. Per chart review, pt has gained 7 lbs in the past 5 months. No muscle or fat wasting noted during physical assessment.  Not meeting needs. Per pharmacy note today at 1012: At 1800  today:  Start Clinimix E 5/15 at 3ml/hr.  20% fat emulsion at 8ml/hr.  Plan to advance as tolerated to the goal rate.  TPN to contain standard multivitamins and trace elements.  Reduce IVF to 70ml/hr.  Add CBG/SSI q6.   TPN lab panels on Mondays & Thursdays.  Goal for TPN: Clinimix E 5/15 @ 83 mL/hr with 20% lipids @ 10 mL/hr which will provide 1919 kcal and 100 grams of protein which will meet 100% estimated needs.  Medications reviewed. Labs reviewed; BUN <5 mg/dL, Ca: 8.5 mg/dL.   Diet Order:  Diet clear liquid Room service appropriate?: Yes; Fluid consistency:: Thin TPN (CLINIMIX-E) Adult Diet NPO time specified  Skin:  Reviewed, no issues  Last BM:  1/15  Height:   Ht Readings from Last 1 Encounters:  02/20/15 5\' 2"  (1.575 m)    Weight:   Wt Readings from Last 1 Encounters:  02/20/15 256 lb (116.121 kg)    Ideal Body Weight:  50 kg (kg)  BMI:  Body mass index is 46.81 kg/(m^2).  Estimated Nutritional Needs:   Kcal:  [id number]  Protein:  85-95 grams  Fluid:  >/= 2 L/day  EDUCATION NEEDS:   No education needs identified at this time     Jarome Matin, RD, LDN Inpatient Clinical Dietitian Pager # [phone] After hours/weekend pager # [phone]

## 2015-03-01 NOTE — Progress Notes (Signed)
PT Cancellation Note  Patient Details Name: Crystal Brennan MRN: [id number] DOB: [birth year]   Cancelled Treatment:    Reason Eval/Treat Not Completed: Patient at procedure or test/unavailable (pt at diagnostic radiology, will follow. )   [name] 03/01/2015, 11:42 AM [phone]

## 2015-03-02 LAB — MAGNESIUM: MAGNESIUM: 2.3 mg/dL (ref 1.7–2.4)

## 2015-03-02 LAB — COMPREHENSIVE METABOLIC PANEL
ALK PHOS: 37 U/L — AB (ref 38–126)
ALT: 13 U/L — AB (ref 14–54)
AST: 20 U/L (ref 15–41)
Albumin: 2.9 g/dL — ABNORMAL LOW (ref 3.5–5.0)
Anion gap: 11 (ref 5–15)
BUN: 5 mg/dL — AB (ref 6–20)
CALCIUM: 8.7 mg/dL — AB (ref 8.9–10.3)
CO2: 26 mmol/L (ref 22–32)
CREATININE: 0.59 mg/dL (ref 0.44–1.00)
Chloride: 101 mmol/L (ref 101–111)
GFR calc non Af Amer: >60 mL/min (ref >60)
Glucose, Bld: 116 mg/dL — ABNORMAL HIGH (ref 65–99)
Potassium: 3.7 mmol/L (ref 3.5–5.1)
SODIUM: 138 mmol/L (ref 135–145)
Total Bilirubin: 0.6 mg/dL (ref 0.3–1.2)
Total Protein: 6.7 g/dL (ref 6.5–8.1)

## 2015-03-02 LAB — DIFFERENTIAL
BASOS ABS: 0 10*3/uL (ref 0.0–0.1)
Basophils Relative: 0 %
EOS ABS: 0.3 10*3/uL (ref 0.0–0.7)
Eosinophils Relative: 5 %
LYMPHS ABS: 1.5 10*3/uL (ref 0.7–4.0)
LYMPHS PCT: 21 %
MONOS PCT: 5 %
Monocytes Absolute: 0.4 10*3/uL (ref 0.1–1.0)
NEUTROS ABS: 5 10*3/uL (ref 1.7–7.7)
NEUTROS PCT: 69 %

## 2015-03-02 LAB — CBC
HEMATOCRIT: 28 % — AB (ref 36.0–46.0)
Hemoglobin: 9.2 g/dL — ABNORMAL LOW (ref 12.0–15.0)
MCH: 26.7 pg (ref 26.0–34.0)
MCHC: 32.9 g/dL (ref 30.0–36.0)
MCV: 81.2 fL (ref 78.0–100.0)
Platelets: 523 10*3/uL — ABNORMAL HIGH (ref 150–400)
RBC: 3.45 MIL/uL — ABNORMAL LOW (ref 3.87–5.11)
RDW: 15.2 % (ref 11.5–15.5)
WBC: 7.2 10*3/uL (ref 4.0–10.5)

## 2015-03-02 LAB — GLUCOSE, CAPILLARY
GLUCOSE-CAPILLARY: 125 mg/dL — AB (ref 65–99)
GLUCOSE-CAPILLARY: 109 mg/dL — AB (ref 65–99)
GLUCOSE-CAPILLARY: 122 mg/dL — AB (ref 65–99)
Glucose-Capillary: 115 mg/dL — ABNORMAL HIGH (ref 65–99)
Glucose-Capillary: 116 mg/dL — ABNORMAL HIGH (ref 65–99)

## 2015-03-02 LAB — PHOSPHORUS: Phosphorus: 5.1 mg/dL — ABNORMAL HIGH (ref 2.5–4.6)

## 2015-03-02 LAB — PREALBUMIN: Prealbumin: 7.2 mg/dL — ABNORMAL LOW (ref 18–38)

## 2015-03-02 LAB — HEPARIN LEVEL (UNFRACTIONATED): Heparin Unfractionated: 0.53 IU/mL (ref 0.30–0.70)

## 2015-03-02 LAB — TRIGLYCERIDES: Triglycerides: 158 mg/dL — ABNORMAL HIGH (ref <150)

## 2015-03-02 MED ORDER — SODIUM CHLORIDE 0.9 % IV SOLN
INTRAVENOUS | Status: DC
  Administered 2015-03-03: 04:00:00 via INTRAVENOUS
  Filled 2015-03-02 (×2): qty 1000

## 2015-03-02 MED ORDER — BISACODYL 10 MG RE SUPP
10.0000 mg | Freq: Once | RECTAL | Status: AC
  Administered 2015-03-02: 10 mg via RECTAL
  Filled 2015-03-02: qty 1

## 2015-03-02 MED ORDER — FAT EMULSION 20 % IV EMUL
240.0000 mL | INTRAVENOUS | Status: AC
  Administered 2015-03-02: 240 mL via INTRAVENOUS
  Filled 2015-03-02: qty 250

## 2015-03-02 MED ORDER — TRACE MINERALS CR-CU-MN-SE-ZN 10-1000-500-60 MCG/ML IV SOLN
INTRAVENOUS | Status: AC
  Administered 2015-03-02: 18:00:00 via INTRAVENOUS
  Filled 2015-03-02: qty 1560

## 2015-03-02 NOTE — Progress Notes (Signed)
Subjective: She is just a little better this AM.  Not as much pain last night, she still has pain with voiding.  Still tender to palpation RLQ.  Objective: Vital signs in last 24 hours: Temp:  [98.4 F (36.9 C)] 98.4 F (36.9 C) (01/17 0551) Pulse Rate:  [85-99] 88 (01/17 0551) Resp:  [18-20] 20 (01/17 0551) BP: (120-139)/(54-89) 120/54 mmHg (01/17 0551) SpO2:  [94 %-100 %] 96 % (01/17 0551) Last BM Date: 03/01/15 Drain 15 ml, urine not recorded No BM recorded. Afebrile, VSS Labs OK Repeat CT scan yesterday:  Persistent but definite interval improvement of sigmoid Diverticulitis.  Drained diverticular abscess.  Intake/Output from previous day: 01/16 0701 - 01/17 0700 In: 2325.3 [I.V.:1708; TPN:597.3] Out: 15 [Drains:15] Intake/Output this shift:    General appearance: alert, cooperative and no distress GI: soft, a little less tender and generalized pain is a little better RLQ.  + BS, no flatus/BM  Lab Results:   Recent Labs  03/01/15 0705 03/02/15 0455  WBC 6.5 7.2  HGB 8.9* 9.2*  HCT 27.0* 28.0*  PLT 468* 523*    BMET  Recent Labs  03/01/15 0705 03/02/15 0455  NA 139 138  K 4.0 3.7  CL 105 101  CO2 24 26  GLUCOSE 95 116*  BUN <5* 5*  CREATININE 0.61 0.59  CALCIUM 8.5* 8.7*   PT/INR No results for input(s): LABPROT, INR in the last 72 hours.   Recent Labs Lab 02/25/15 0545 03/01/15 1110 03/02/15 0455  AST 11* 19 20  ALT 9* 24 13*  ALKPHOS 35* 35* 37*  BILITOT 0.8 0.6 0.6  PROT 6.1* 6.4* 6.7  ALBUMIN 2.8* 2.7* 2.9*     Lipase     Component Value Date/Time   LIPASE 19 02/20/2015 1950     Studies/Results: Ct Abdomen Pelvis W Contrast  03/01/2015  CLINICAL DATA:  Followup abscess drainage 02/26/2015. History of diverticular abscess. EXAM: CT ABDOMEN AND PELVIS WITH CONTRAST TECHNIQUE: Multidetector CT imaging of the abdomen and pelvis was performed using the standard protocol following bolus administration of intravenous contrast.  CONTRAST:  176mL OMNIPAQUE IOHEXOL 300 MG/ML  SOLN COMPARISON:  CT scan 02/26/2015 and 02/25/2015 FINDINGS: Lower chest: Small bilateral pleural effusions with overlying atelectasis. Hepatobiliary: Small scattered hepatic cysts are again demonstrated. No worrisome hepatic lesions or intrahepatic biliary dilatation. The gallbladder surgically absent. No common bile duct dilatation. Pancreas: No mass, inflammation or ductal dilatation. Spleen: Normal size.  No focal lesions. Adrenals/Urinary Tract: The adrenal glands and kidneys are normal. Stomach/Bowel: The stomach, duodenum and small bowel are unremarkable. Persistent but definitely improving diverticulitis involving the sigmoid colon. The diverticular abscess has been completely drained. Vascular/Lymphatic: Small scattered mesenteric and retroperitoneal lymph nodes likely inflammatory. The aorta and branch vessels are normal. The major venous structures are patent. Other: The uterus and ovaries are normal. Small amount of free pelvic fluid. No pelvic abscess. The bladder appears normal. No inguinal mass or adenopathy. Small scattered lymph nodes are stable. Musculoskeletal: No significant findings. IMPRESSION: 1. Persistent but definite interval improvement of sigmoid diverticulitis. 2. Drained diverticular abscess. Electronically Signed   By: Marijo Sanes M.D.   On: 03/01/2015 15:42   Dg Abd 2 Views  03/01/2015  CLINICAL DATA:  Acute diverticulitis EXAM: ABDOMEN - 2 VIEW COMPARISON:  02/26/2015 and 02/22/2015 FINDINGS: There is normal small bowel gas pattern. A drainage catheter is noted in right lower abdomen. Some colonic gas noted throughout the colon without colonic distension. Postcholecystectomy surgical clips are noted. IMPRESSION: Normal  small bowel gas pattern. Some colonic gas noted throughout the colon. No significant colonic distension. Drainage catheter is noted in right lower abdomen Electronically Signed   By: Lahoma Crocker M.D.   On: 03/01/2015  12:07    Medications: . diltiazem  180 mg Oral Daily  . ertapenem  1 g Intravenous Q24H  . insulin aspart  0-9 Units Subcutaneous 4 times per day  . metoprolol  50 mg Oral BID  . ranitidine  150 mg Oral BID  . sodium chloride  3 mL Intravenous Q12H  . zolpidem  5 mg Oral QHS   . Marland KitchenTPN (CLINIMIX-E) Adult 40 mL/hr at 03/01/15 1803   And  . fat emulsion 240 mL (03/01/15 1804)  . heparin 2,200 Units/hr (03/01/15 2044)  . 0.9 % sodium chloride with kcl 50 mL/hr at 03/01/15 2148    Assessment/Plan Recurrent diverticulitis IR drain placement 02/26/15 with purulent fluid drained Hx of AF on Eliquis, last dose 02/19/15 Hypertension Obesity, BMI 47 Malnutrition  Prealbumin 5.9  03/01/15 Tobacco use, quit 12/2014 Hx of anxiety/depression Chronic back pain Hx of migraine Hx of arthritis Antibiotics: Day 3 Flagyl, day 2 Cipro completed, 4 days of Zosyn, Day 6 of Invanz DVT: Heparin drip/SCD   Plan:  She is just a little better this AM, she is on TNA for nutrition now.  Her CT scan yesterday was improved.  Currently we plan to continue the conservative course.    LOS: 10 days    Yilia Sacca 03/02/2015

## 2015-03-02 NOTE — Progress Notes (Signed)
Patient ID: Crystal Brennan, female   DOB: [birth year], 50 y.o.   MRN: [id number]  [facility] PROGRESS NOTE  Crystal Brennan [id number] DOB: 07/18/1965 DOA: 02/20/2015   PCP: Serita Grammes  Brief narrative:    50 yo patient with history of A. fib on Eliquis, HTN, OSA on nightly CPAP and recurrent diverticulitis (3 prior episodes) presented to the [facility] ED on 02/20/15 with complaints of 2 day history of abdominal pain similar to prior episodes of diverticulitis. No nausea or vomiting reported. Possible constipation-last p.m. 02/20/15. CT abdomen in ED confirmed sigmoid diverticulitis and microperforation. Treating conservatively. Surgeons following.  Assessment/Plan:    Perforated sigmoid diverticulitis with pneumoperitoneum - Treating conservatively with bowel rest (some clears started by surgery today ), IV fluids, general surgery to continue the conservative course- Zosyn changed to Iv Invanz 1/12, Repeat CT 1/12 Previously seen small amount of contained extraluminal air adjacent to the sigmoid colon is significantly larger measuring 4.9 x 5.6 cm. Repeat CT on 1/16 shows that   diverticular abscess has been completely drained. - s/p perc drain per IR 1/13, FU Culture, improving clinically. On clear liquid diet , TPN now started 1/16   Hypokalemia resolved - supplemented and WNL - BMP in MA   Anemia - Hb stable 9.2 , no overt bleeding, on IV heparin  - monitor, transfuse if trends down further - no hemoperitoneum noted on last scan  Chronic diastolic CHF - Compensated. Monitor closely while being hydrated.   Proximal A. Fib - Anticoagulated on Eliquis at home- last dose 02/19/2015. Help prior to drain being placed  - Continue IV heparin and resume Eliquis in AM  - CHA2DS2-VASc score: at least 3. - Continue home dose of Cardizem & Metoprolol.   Essential hypertension - Controlled. Transient hypotension in ED which has resolved.   Morbid obesity/Body mass index  is 46.81 kg/(m^2)/OSA - Continue nightly CPAP.  Tobacco abuse - quit in 11/16  Thrombocytosis - reactive, CBC in AM  History of anxiety and depression - on ativan as needed   H/o Pos MRSA nasal swab,  - repeat negative  DVT Prophylaxis - Currently on a heparin drip. Resume Eliquis if no surgery planned   Code Status: Full.  Family Communication:  plan of care discussed with the patient   Disposition Plan: Home when cleared by surgery team    IV access:  Peripheral IV  Procedures and diagnostic studies:    Dg Abd 1 View 02/22/2015  Oral contrast seen within colon. Diverticulosis noted. No evidence of bowel obstruction.   Ct Abdomen Pelvis W Contrast 02/25/2015  Perforated sigmoid diverticulitis which has worsened compared with 02/20/2015. There is now small volume pneumoperitoneum. Previously seen small amount of contained extraluminal air adjacent to the sigmoid colon is significantly larger measuring approximately 4.9 x 5.6 cm. 2. Small right pleural effusion. Bibasilar airspace disease likely reflecting atelectasis. These results were called by telephone at the time of interpretation on 02/25/2015 at 1:52 pm to Dr. Barkley Bruns, who verbally acknowledged these results.   Ct Abdomen Pelvis W Contrast 02/20/2015 Sigmoid diverticulitis. There appears to be 2 sections of the mid sigmoid colon there are inflamed, both projecting in the right lower quadrant. Areas associated extraluminal air reflecting diverticular perforation, but no evidence of an abscess. 2. No other acute findings. 3. Multiple other noninflamed colonic diverticula. 4. Stable small hepatic cysts.  Ct Image Guided Drainage By Percutaneous Catheter 02/26/2015  CT-guided percutaneous catheter drainage performed of a diverticular abscess. A  12 French drain was placed and attached to suction bulb drainage.  Medical Consultants:  Surgery   Other Consultants:  PT  IAnti-Infectives:   Cipro 02/20/15 > 02/22/15 Flagyl  02/20/15 > 02/22/15 Zosyn 02/22/15 > 02/25/15 Ertapenem 02/25/15 >   Reyne Dumas, MD  [facility] Pager [phone]  If 7PM-7AM, please contact night-coverage [email or web address] Password [facility] 03/02/2015, 11:33 AM   LOS: 10 days   HPI/Subjective: Not as much pain last night, she still has pain with voiding. Still tender to palpation RLQ.still no bm  Objective: Filed Vitals:   03/01/15 0637 03/01/15 2132 03/01/15 2240 03/02/15 0551  BP: 146/69 139/89  120/54  Pulse: 98 99 85 88  Temp: 98.1 F (36.7 C) 98.4 F (36.9 C)  98.4 F (36.9 C)  TempSrc: Oral Oral  Oral  Resp: 18 18 20 20   Height:      Weight:      SpO2: 99% 100% 94% 96%    Intake/Output Summary (Last 24 hours) at 03/02/15 1133 Last data filed at 03/02/15 0600  Gross per 24 hour  Intake 2325.33 ml  Output     15 ml  Net 2310.33 ml    Exam:   General:  Pt is alert, follows commands appropriately, not in acute distress  Cardiovascular: Regular rate and rhythm,  no rubs, no gallops  Respiratory: Clear to auscultation bilaterally, no wheezing, diminished breath sounds at bases   Abdomen: Soft, non tender, bowel sounds present, no guarding   Data Reviewed: Basic Metabolic Panel:  Recent Labs Lab 02/24/15 0550  02/26/15 0700 02/27/15 0247 02/28/15 0610 03/01/15 0705 03/02/15 0455  NA 138  < > 137 139 139 139 138  K 3.5  < > 3.6 3.8 3.9 4.0 3.7  CL 107  < > 108 108 105 105 101  CO2 21*  < > 17* 22 22 24 26   GLUCOSE 105*  < > 102* 93 92 95 116*  BUN 7  < > 6 5* <5* <5* 5*  CREATININE 0.69  < > 0.62 0.74 0.59 0.61 0.59  CALCIUM 8.5*  < > 8.1* 8.3* 8.4* 8.5* 8.7*  MG 2.0  --   --   --   --   --  2.3  PHOS  --   --   --   --   --   --  5.1*  < > = values in this interval not displayed. Liver Function Tests:  Recent Labs Lab 02/25/15 0545 03/01/15 1110 03/02/15 0455  AST 11* 19 20  ALT 9* 24 13*  ALKPHOS 35* 35* 37*  BILITOT 0.8 0.6 0.6  PROT 6.1* 6.4* 6.7  ALBUMIN 2.8* 2.7* 2.9*   CBC:  Recent  Labs Lab 02/26/15 0700 02/27/15 0247 02/28/15 0610 03/01/15 0705 03/02/15 0455  WBC 9.9 7.8 7.2 6.5 7.2  NEUTROABS  --   --   --   --  5.0  HGB 7.6* 8.5* 8.7* 8.9* 9.2*  HCT 22.9* 26.1* 26.6* 27.0* 28.0*  MCV 80.1 81.8 81.6 80.6 81.2  PLT 368 372 414* 468* 523*   Recent Results (from the past 240 hour(s))  Surgical pcr screen     Status: Abnormal   Collection Time: 02/26/15  5:34 AM  Result Value Ref Range Status   MRSA, PCR NEGATIVE NEGATIVE Final   Staphylococcus aureus POSITIVE (A) NEGATIVE Final    Comment:        The Xpert SA Assay (FDA approved for NASAL specimens in patients over 6 years of age), is  one component of a comprehensive surveillance program.  Test performance has been validated by [facility] for patients greater than or equal to 30 year old. It is not intended to diagnose infection nor to guide or monitor treatment.   Culture, routine-abscess     Status: None   Collection Time: 02/26/15  3:19 PM  Result Value Ref Range Status   Specimen Description ABSCESS DIVERTICULAR  Final   Special Requests NONE  Final   Gram Stain   Final    ABUNDANT WBC PRESENT,BOTH PMN AND MONONUCLEAR NO SQUAMOUS EPITHELIAL CELLS SEEN NO ORGANISMS SEEN Performed at [facility]    Culture   Final    NO GROWTH 2 DAYS Performed at [facility]    Report Status 03/01/2015 FINAL  Final  Anaerobic culture     Status: None (Preliminary result)   Collection Time: 02/26/15  3:19 PM  Result Value Ref Range Status   Specimen Description ABSCESS DIVERTICULAR  Final   Special Requests NONE  Final   Gram Stain   Final    ABUNDANT WBC PRESENT,BOTH PMN AND MONONUCLEAR NO SQUAMOUS EPITHELIAL CELLS SEEN NO ORGANISMS SEEN Performed at [facility]    Culture   Final    NO ANAEROBES ISOLATED; CULTURE IN PROGRESS FOR 5 DAYS Performed at [facility]    Report Status PENDING  Incomplete     Scheduled Meds: . diltiazem  180 mg Oral Daily  .  ertapenem  1 g Intravenous Q24H  . insulin aspart  0-9 Units Subcutaneous 4 times per day  . metoprolol  50 mg Oral BID  . ranitidine  150 mg Oral BID  . sodium chloride  3 mL Intravenous Q12H  . zolpidem  5 mg Oral QHS   Continuous Infusions: . Marland KitchenTPN (CLINIMIX-E) Adult     And  . fat emulsion    . Marland KitchenTPN (CLINIMIX-E) Adult 40 mL/hr at 03/01/15 1803   And  . fat emulsion 240 mL (03/01/15 1804)  . heparin 2,200 Units/hr (03/02/15 0950)  . 0.9 % sodium chloride with kcl

## 2015-03-02 NOTE — Progress Notes (Signed)
PARENTERAL NUTRITION CONSULT NOTE - follow up  Pharmacy Consult for TPN Indication: prolonged malnutrition, possible bowel obstruction  Allergies  Allergen Reactions  . Benadryl [Diphenhydramine Hcl] Hives and Itching  . Codeine Nausea And Vomiting  . Dilaudid [Hydromorphone Hcl]     "feels like she dying '  . Percocet [Oxycodone-Acetaminophen] Hives and Itching    Patient Measurements: Height: 5\' 2"  (157.5 cm) Weight: 256 lb (116.121 kg) IBW/kg (Calculated) : 50.1 Adjusted Body Weight: 67 kg  Vital Signs: Temp: 98.4 F (36.9 C) (01/17 0551) Temp Source: Oral (01/17 0551) BP: 120/54 mmHg (01/17 0551) Pulse Rate: 88 (01/17 0551) Intake/Output from previous day: 01/16 0701 - 01/17 0700 In: 2325.3 [I.V.:1708; TPN:597.3] Out: 15 [Drains:15] Intake/Output from this shift:    Labs:  Recent Labs  02/28/15 0610 03/01/15 0705 03/02/15 0455  WBC 7.2 6.5 7.2  HGB 8.7* 8.9* 9.2*  HCT 26.6* 27.0* 28.0*  PLT 414* 468* 523*     Recent Labs  02/28/15 0610 03/01/15 0705 03/01/15 1110 03/02/15 0453 03/02/15 0455  NA 139 139  --   --  138  K 3.9 4.0  --   --  3.7  CL 105 105  --   --  101  CO2 22 24  --   --  26  GLUCOSE 92 95  --   --  116*  BUN <5* <5*  --   --  5*  CREATININE 0.59 0.61  --   --  0.59  CALCIUM 8.4* 8.5*  --   --  8.7*  MG  --   --   --   --  2.3  PHOS  --   --   --   --  5.1*  PROT  --   --  6.4*  --  6.7  ALBUMIN  --   --  2.7*  --  2.9*  AST  --   --  19  --  20  ALT  --   --  24  --  13*  ALKPHOS  --   --  35*  --  37*  BILITOT  --   --  0.6  --  0.6  BILIDIR  --   --  <0.1*  --   --   IBILI  --   --  NOT CALCULATED  --   --   PREALBUMIN  --   --  5.9* 7.2*  --   TRIG  --   --   --   --  158*   Estimated Creatinine Clearance: 100.5 mL/min (by C-G formula based on Cr of 0.59).    Recent Labs  03/01/15 1743 03/02/15 0020 03/02/15 0546  GLUCAP 89 116* 125*    Medical History: Past Medical History  Diagnosis Date  . Chronic back  pain     "upper" (01/01/2014)  . Hypertension   . Depression   . Anxiety   . Anemia   . Menometrorrhagia   . Insomnia   . Diverticulitis   . Sickle cell trait ([place])   . Cardiomegaly     a. 2011 Echo: EF 65-70%, Gr 1 DD, nl wall motion.  . Morbid obesity ([place])   . Tobacco abuse   . Palpitations     a. Date back to 08/2013.  . Diverticulitis     a. 09/2013  . Family history of adverse reaction to anesthesia     "Mom gets so sick off of it"  . Heart murmur   .  Chronic bronchitis ([place])     "get it ~ q yr" (01/01/2014)  . GERD (gastroesophageal reflux disease)   . Headache     "monthly" (01/01/2014)  . Migraine     "monthly" (01/01/2014)  . Arthritis     "qwhere"    Medications:  Scheduled:  . diltiazem  180 mg Oral Daily  . ertapenem  1 g Intravenous Q24H  . insulin aspart  0-9 Units Subcutaneous 4 times per day  . metoprolol  50 mg Oral BID  . ranitidine  150 mg Oral BID  . sodium chloride  3 mL Intravenous Q12H  . zolpidem  5 mg Oral QHS   Infusions:  . Marland KitchenTPN (CLINIMIX-E) Adult 40 mL/hr at 03/01/15 1803   And  . fat emulsion 240 mL (03/01/15 1804)  . heparin 2,200 Units/hr (03/02/15 0950)  . 0.9 % sodium chloride with kcl 50 mL/hr at 03/01/15 2148    Insulin Requirements: 1 unit SSI  Current Nutrition: now NPO  IVF: NS with 97mEq KCL at 50 ml/hr  Central access: PICC TPN start date: 1/16  ASSESSMENT                                                                                                          HPI: Patient known to pharmacy for dosing of IV heparin. Per CCS, patient not improving since drain placement last week and has not eaten since 1/7. Plan per CCS to check for obstruction and start TPN  Significant events:  1/17: plan per CCS to continue conservative approach for now with no surgery, CT improving  Today, 03/02/2015  Glucose - at goal - range 89-125  Electrolytes - stable except phos elevated - monitor for now  Renal - SCr stable  LFTs  - stable  TGs - 158 (1/17)  Prealbumin - 5.8 (1/12), 7.2 (1/17)  NUTRITIONAL GOALS                                                                                             RD recs: Goals/day: Kcal 1750-1950, protein 85-95g, >= 2L  Clinimix E 5/15 at a goal rate of 46ml/hr + 20% fat emulsion at 12ml/hr to provide: 100g/day protein, 1919Kcal/day. Note used adjusted body weight for calculations due to obesity  PLAN  At 1800 today:  Increase Clinimix E 5/15 to 84ml/hr.  20% fat emulsion at 24ml/hr.  Plan to advance as tolerated to the goal rate.  TPN to contain standard multivitamins and trace elements.  Reduce IVF to [facility].  Continue CBG/SSI q6.   TPN lab panels on Mondays & Thursdays.  Monitor phos closely - if continues to rise will need to remove lytes from [employer], PharmD, BCPS Pager [phone] 03/02/2015 10:17 AM

## 2015-03-02 NOTE — Progress Notes (Signed)
Pt refused cpap tonight stating she is in too much pain.  Pt was advised that RT is available all night and encouraged her to call should she change her mind.  RN aware.

## 2015-03-02 NOTE — Progress Notes (Signed)
ANTICOAGULATION CONSULT NOTE - Follow Up Consult  Pharmacy Consult for Heparin Indication: bridge therapy for Afib while off Apixaban  Allergies  Allergen Reactions  . Benadryl [Diphenhydramine Hcl] Hives and Itching  . Codeine Nausea And Vomiting  . Dilaudid [Hydromorphone Hcl]     "feels like she dying '  . Percocet [Oxycodone-Acetaminophen] Hives and Itching   Patient Measurements: Height: 5\' 2"  (157.5 cm) Weight: 256 lb (116.121 kg) IBW/kg (Calculated) : 50.1 HEPARIN DW (KG): 78.7  Vital Signs: Temp: 98.4 F (36.9 C) (01/17 0551) Temp Source: Oral (01/17 0551) BP: 120/54 mmHg (01/17 0551) Pulse Rate: 88 (01/17 0551)  Labs:  Recent Labs  02/28/15 0610 03/01/15 0705 03/02/15 0455  HGB 8.7* 8.9* 9.2*  HCT 26.6* 27.0* 28.0*  PLT 414* 468* 523*  HEPARINUNFRC 0.45 0.53 0.53  CREATININE 0.59 0.61 0.59    Estimated Creatinine Clearance: 100.5 mL/min (by C-G formula based on Cr of 0.59).  Medications:  Infusions:  . Marland KitchenTPN (CLINIMIX-E) Adult 40 mL/hr at 03/01/15 1803   And  . fat emulsion 240 mL (03/01/15 1804)  . heparin 2,200 Units/hr (03/02/15 0950)  . 0.9 % sodium chloride with kcl 50 mL/hr at 03/01/15 2148  PTA: apixaban 5mg  BID LD 1/6@ 2100  Assessment: 50 yo F admitted with sigmoid diverticulitis and microperforation currently being treated conservatively with bowel rest and IV antibiotics.  She is on chronic Apixaban for hx Afib which was held on admission.  Pharmacy asked to manage heparin bridge while off apixaban.  Heparin held 1/13 for IR drain placement, Heparin resumption delayed by loss of IV access.   PICC placed 1/14  03/02/2015:  Heparin level continues to be therapeutic on current rate of 2200 units/hr  CBC stable  No reported bleeding except slight vaginal bleeding overnight 1/13.  Goal of Therapy:  Heparin level 0.3-0.7 units/ml Monitor platelets by anticoagulation protocol: Yes   Plan:   Continue Heparin at 2200 units/hr  Daily  Heparin level, CBC  Per notes, continuing conservative approach per CCS   Adrian Saran, PharmD, BCPS Pager [phone] 03/02/2015 10:14 AM

## 2015-03-03 ENCOUNTER — Encounter (HOSPITAL_COMMUNITY): Payer: Self-pay | Admitting: Internal Medicine

## 2015-03-03 DIAGNOSIS — I5032 Chronic diastolic (congestive) heart failure: Secondary | ICD-10-CM

## 2015-03-03 DIAGNOSIS — I480 Paroxysmal atrial fibrillation: Secondary | ICD-10-CM

## 2015-03-03 DIAGNOSIS — E876 Hypokalemia: Secondary | ICD-10-CM

## 2015-03-03 DIAGNOSIS — D473 Essential (hemorrhagic) thrombocythemia: Secondary | ICD-10-CM | POA: Diagnosis present

## 2015-03-03 DIAGNOSIS — D638 Anemia in other chronic diseases classified elsewhere: Secondary | ICD-10-CM

## 2015-03-03 DIAGNOSIS — G4733 Obstructive sleep apnea (adult) (pediatric): Secondary | ICD-10-CM

## 2015-03-03 DIAGNOSIS — I10 Essential (primary) hypertension: Secondary | ICD-10-CM

## 2015-03-03 DIAGNOSIS — N739 Female pelvic inflammatory disease, unspecified: Secondary | ICD-10-CM | POA: Insufficient documentation

## 2015-03-03 DIAGNOSIS — D75839 Thrombocytosis, unspecified: Secondary | ICD-10-CM | POA: Diagnosis present

## 2015-03-03 LAB — URINALYSIS, ROUTINE W REFLEX MICROSCOPIC
Bilirubin Urine: NEGATIVE
GLUCOSE, UA: NEGATIVE mg/dL
Ketones, ur: NEGATIVE mg/dL
Nitrite: NEGATIVE
Protein, ur: NEGATIVE mg/dL
SPECIFIC GRAVITY, URINE: 1.008 (ref 1.005–1.030)
pH: 6.5 (ref 5.0–8.0)

## 2015-03-03 LAB — COMPREHENSIVE METABOLIC PANEL
ALBUMIN: 3 g/dL — AB (ref 3.5–5.0)
ALK PHOS: 38 U/L (ref 38–126)
ALT: 15 U/L (ref 14–54)
ANION GAP: 11 (ref 5–15)
AST: 21 U/L (ref 15–41)
BILIRUBIN TOTAL: 0.3 mg/dL (ref 0.3–1.2)
BUN: 8 mg/dL (ref 6–20)
CALCIUM: 9.3 mg/dL (ref 8.9–10.3)
CO2: 26 mmol/L (ref 22–32)
CREATININE: 0.75 mg/dL (ref 0.44–1.00)
Chloride: 103 mmol/L (ref 101–111)
GFR calc Af Amer: >60 mL/min (ref >60)
GFR calc non Af Amer: >60 mL/min (ref >60)
GLUCOSE: 130 mg/dL — AB (ref 65–99)
Potassium: 4.2 mmol/L (ref 3.5–5.1)
Sodium: 140 mmol/L (ref 135–145)
TOTAL PROTEIN: 7.1 g/dL (ref 6.5–8.1)

## 2015-03-03 LAB — CBC
HEMATOCRIT: 29.8 % — AB (ref 36.0–46.0)
HEMOGLOBIN: 9.6 g/dL — AB (ref 12.0–15.0)
MCH: 26.1 pg (ref 26.0–34.0)
MCHC: 32.2 g/dL (ref 30.0–36.0)
MCV: 81 fL (ref 78.0–100.0)
Platelets: 599 10*3/uL — ABNORMAL HIGH (ref 150–400)
RBC: 3.68 MIL/uL — ABNORMAL LOW (ref 3.87–5.11)
RDW: 15.2 % (ref 11.5–15.5)
WBC: 8.7 10*3/uL (ref 4.0–10.5)

## 2015-03-03 LAB — GLUCOSE, CAPILLARY
GLUCOSE-CAPILLARY: 101 mg/dL — AB (ref 65–99)
GLUCOSE-CAPILLARY: 119 mg/dL — AB (ref 65–99)
Glucose-Capillary: 129 mg/dL — ABNORMAL HIGH (ref 65–99)
Glucose-Capillary: 122 mg/dL — ABNORMAL HIGH (ref 65–99)

## 2015-03-03 LAB — URINE MICROSCOPIC-ADD ON

## 2015-03-03 LAB — PHOSPHORUS: Phosphorus: 4.8 mg/dL — ABNORMAL HIGH (ref 2.5–4.6)

## 2015-03-03 LAB — HEPARIN LEVEL (UNFRACTIONATED): Heparin Unfractionated: 0.57 IU/mL (ref 0.30–0.70)

## 2015-03-03 LAB — MAGNESIUM: Magnesium: 2.2 mg/dL (ref 1.7–2.4)

## 2015-03-03 MED ORDER — FAT EMULSION 20 % IV EMUL
240.0000 mL | INTRAVENOUS | Status: AC
  Administered 2015-03-03: 240 mL via INTRAVENOUS
  Filled 2015-03-03: qty 250

## 2015-03-03 MED ORDER — TRACE MINERALS CR-CU-MN-SE-ZN 10-1000-500-60 MCG/ML IV SOLN
INTRAVENOUS | Status: AC
  Administered 2015-03-03: 18:00:00 via INTRAVENOUS
  Filled 2015-03-03: qty 1992

## 2015-03-03 NOTE — Progress Notes (Signed)
Nutrition Follow-up  DOCUMENTATION CODES:   Morbid obesity  INTERVENTION:  - TPN per pharmacy - Continue CLD and continue to advance diet as medically feasible per surgery - RD will continue to monitor for needs  NUTRITION DIAGNOSIS:   Inadequate oral intake related to inability to eat as evidenced by other (see comment) (cramping and nausea with intakes, not eating much if at all since 02/20/15). -ongoing  GOAL:   Patient will meet greater than or equal to 90% of their needs -will be met with TPN regimen at goal rate  MONITOR:   Weight trends, Labs, Skin, I & O's, Other (Comment) (TPN regimen)  ASSESSMENT:   50 yo patient with history of A. fib on Eliquis, HTN, OSA on nightly CPAP and recurrent diverticulitis (3 prior episodes) presented to the [facility] ED on 02/20/15 with complaints of 2 day history of abdominal pain similar to prior episodes of diverticulitis. No nausea or vomiting reported. Possible constipation-last p.m. 02/20/15. CT abdomen in ED confirmed sigmoid diverticulitis and microperforation. Treating conservatively. Surgeons following.  1/18 Pt is currently receiving Clinimix E 5/15 @ 65 mL/hr with 20% lipids @ 10 mL/hr which is providing 1502 kcal, 72 grams of protein and is not meeting needs. Per pharmacy note, goal TPN: Clinimix E 5/15 @ 83 mL/hr (1992 mL/day) with 20% lipids @ 10 mL/hr which will provide 1894 kcal, 100 grams of protein which will meet estimated needs.   Pt was on the phone at time of RD visit. Surgery note from today states that pt had stated wanting to drink clear liquids and that pt is having some bowel movements; surgery recommending to allow CLD and order placed already.   Per pharmacy note this AM at [phone]: At 1800 today:  Increase Clinimix E 5/15 to 35m/hr.  20% fat emulsion at 158mhr.  TPN to contain standard multivitamins and trace elements.  [facility] Continue CBG/SSI q6.   TPN lab panels on Mondays &  Thursdays.  Monitor phos closely - if continues to rise will need to remove lytes from TPN   Medications reviewed. Labs reviewed; CBGs: 89-129 mg/dL, Phos: 4.8 mg/dL.   1/16 - Pt very drowsy at time of RD visit and the majority of information was provided by her daughter, who is at bedside.  - Pt attempted to consume chicken broth yesterday but had severe cramping and was unable to consume more than a few spoonfuls.  - PTA pt had been trying to eat a more healthy diet and was also avoiding foods that she had previously been advised may lead to diverticulitis (as she has a hx of this). - Despite these changes pt was experiencing severe cramping and associated nausea and vomiting with intakes for "a long time."  - Family denies pt having any emesis since admission but she has been complaining of ongoing cramping/abdominal pain even without intakes.  - Per chart review, pt has gained 7 lbs in the past 5 months.  - No muscle or fat wasting noted during physical assessment. - Per pharmacy note today at 1012: At 1800 today:  Start Clinimix E 5/15 at 403mr.  20% fat emulsion at 16m82m.  Plan to advance as tolerated to the goal rate.  TPN to contain standard multivitamins and trace elements.  Reduce IVF to 50ml40m  Add CBG/SSI q6.   Goal for TPN: Clinimix E 5/15 @ 83 mL/hr with 20% lipids @ 10 mL/hr which will provide 1919 kcal and 100 grams of protein  which will meet 100% estimated needs    Diet Order:  .TPN (CLINIMIX-E) Adult TPN (CLINIMIX-E) Adult Diet clear liquid Room service appropriate?: Yes; Fluid consistency:: Thin  Skin:  Reviewed, no issues  Last BM:  1/17  Height:   Ht Readings from Last 1 Encounters:  02/20/15 _0  (1.575 m)    Weight:   Wt Readings from Last 1 Encounters:  02/20/15 256 lb (116.121 kg)    Ideal Body Weight:  50 kg (kg)  BMI:  Body mass index is 46.81 kg/(m^2).  Estimated Nutritional Needs:   Kcal:  6438-3779  Protein:  85-95  grams  Fluid:  >/= 2 L/day  EDUCATION NEEDS:   No education needs identified at this time     Jarome Matin, RD, LDN Inpatient Clinical Dietitian Pager # [phone] After hours/weekend pager # [phone]

## 2015-03-03 NOTE — Progress Notes (Signed)
Patient ID: Crystal Brennan, female   DOB: [birth year], 50 y.o.   MRN: [id number]     [address] [place], [place] [address]    Phone: [phone] FAX: [phone]     Subjective: Had a BM yesterday.  Complains pain with urinating.drain 63m output/ cloudy.  Afebrile Normal WBC.   Objective:  Vital signs:  Filed Vitals:   03/02/15 0551 03/02/15 1614 03/02/15 2300 03/03/15 0700  BP: 120/54 126/91 136/83 132/81  Pulse: 88 86 95 94  Temp: 98.4 F (36.9 C) 98.7 F (37.1 C) 99 F (37.2 C) 98.6 F (37 C)  TempSrc: Oral Oral Oral Oral  Resp: 20 20 20 20   Height:      Weight:      SpO2: 96% 97% 95% 98%    Last BM Date: 03/02/15  Intake/Output   Yesterday:  01/17 0701 - 01/18 0700 In: 145 [I.V.:120] Out: 40 [Drains:40] This shift:     Physical Exam: General: Pt awake/alert/oriented x4 in no acute distress  Abdomen: Soft.  Nondistended.  TTP RLQ. RLQ drain in place, cloudy output.    Problem List:   Principal Problem:   Diverticulitis of large intestine with perforation without bleeding Active Problems:   Anxiety   PAF (paroxysmal atrial fibrillation) (HCC)   OSA (obstructive sleep apnea)   Hypokalemia   Chronic diastolic congestive heart failure, NYHA class 1 (HCC)   Anemia of chronic disease   Thrombocytosis (HCC)   Benign essential HTN   Morbid obesity due to excess calories ([facility]    Results:   Labs: Results for orders placed or performed during the hospital encounter of 02/20/15 (from the past 48 hour(s))  Hepatic function panel     Status: Abnormal   Collection Time: 03/01/15 11:10 AM  Result Value Ref Range   Total Protein 6.4 (L) 6.5 - 8.1 g/dL   Albumin 2.7 (L) 3.5 - 5.0 g/dL   AST 19 15 - 41 U/L   ALT 24 14 - 54 U/L   Alkaline Phosphatase 35 (L) 38 - 126 U/L   Total Bilirubin 0.6 0.3 - 1.2 mg/dL   Bilirubin, Direct <0.1 (L) 0.1 - 0.5 mg/dL   Indirect Bilirubin NOT CALCULATED  0.3 - 0.9 mg/dL  Prealbumin     Status: Abnormal   Collection Time: 03/01/15 11:10 AM  Result Value Ref Range   Prealbumin 5.9 (L) 18 - 38 mg/dL    Comment: [facility] Glucose, capillary     Status: None   Collection Time: 03/01/15  5:43 PM  Result Value Ref Range   Glucose-Capillary 89 65 - 99 mg/dL  Glucose, capillary     Status: Abnormal   Collection Time: 03/02/15 12:20 AM  Result Value Ref Range   Glucose-Capillary 116 (H) 65 - 99 mg/dL  Prealbumin     Status: Abnormal   Collection Time: 03/02/15  4:53 AM  Result Value Ref Range   Prealbumin 7.2 (L) 18 - 38 mg/dL    Comment: [facility] CBC     Status: Abnormal   Collection Time: 03/02/15  4:55 AM  Result Value Ref Range   WBC 7.2 4.0 - 10.5 K/uL   RBC 3.45 (L) 3.87 - 5.11 MIL/uL   Hemoglobin 9.2 (L) 12.0 - 15.0 g/dL   HCT 28.0 (L) 36.0 - 46.0 %   MCV 81.2 78.0 - 100.0 fL  MCH 26.7 26.0 - 34.0 pg   MCHC 32.9 30.0 - 36.0 g/dL   RDW 15.2 11.5 - 15.5 %   Platelets 523 (H) 150 - 400 K/uL  Heparin level (unfractionated)     Status: None   Collection Time: 03/02/15  4:55 AM  Result Value Ref Range   Heparin Unfractionated 0.53 0.30 - 0.70 IU/mL    Comment:        IF HEPARIN RESULTS ARE BELOW EXPECTED VALUES, AND PATIENT DOSAGE HAS BEEN CONFIRMED, SUGGEST FOLLOW UP TESTING OF ANTITHROMBIN III LEVELS.   Comprehensive metabolic panel     Status: Abnormal   Collection Time: 03/02/15  4:55 AM  Result Value Ref Range   Sodium 138 135 - 145 mmol/L   Potassium 3.7 3.5 - 5.1 mmol/L   Chloride 101 101 - 111 mmol/L   CO2 26 22 - 32 mmol/L   Glucose, Bld 116 (H) 65 - 99 mg/dL   BUN 5 (L) 6 - 20 mg/dL   Creatinine, Ser 0.59 0.44 - 1.00 mg/dL   Calcium 8.7 (L) 8.9 - 10.3 mg/dL   Total Protein 6.7 6.5 - 8.1 g/dL   Albumin 2.9 (L) 3.5 - 5.0 g/dL   AST 20 15 - 41 U/L   ALT 13 (L) 14 - 54 U/L   Alkaline Phosphatase 37 (L) 38 - 126 U/L   Total Bilirubin 0.6 0.3 - 1.2 mg/dL   GFR  calc non Af Amer >60 >60 mL/min   GFR calc Af Amer >60 >60 mL/min    Comment: (NOTE) The eGFR has been calculated using the CKD EPI equation. This calculation has not been validated in all clinical situations. eGFR's persistently <60 mL/min signify possible Chronic Kidney Disease.    Anion gap 11 5 - 15  Magnesium     Status: None   Collection Time: 03/02/15  4:55 AM  Result Value Ref Range   Magnesium 2.3 1.7 - 2.4 mg/dL  Phosphorus     Status: Abnormal   Collection Time: 03/02/15  4:55 AM  Result Value Ref Range   Phosphorus 5.1 (H) 2.5 - 4.6 mg/dL  Triglycerides     Status: Abnormal   Collection Time: 03/02/15  4:55 AM  Result Value Ref Range   Triglycerides 158 (H) <150 mg/dL    Comment: Performed at [facility]  Differential     Status: None   Collection Time: 03/02/15  4:55 AM  Result Value Ref Range   Neutrophils Relative % 69 %   Neutro Abs 5.0 1.7 - 7.7 K/uL   Lymphocytes Relative 21 %   Lymphs Abs 1.5 0.7 - 4.0 K/uL   Monocytes Relative 5 %   Monocytes Absolute 0.4 0.1 - 1.0 K/uL   Eosinophils Relative 5 %   Eosinophils Absolute 0.3 0.0 - 0.7 K/uL   Basophils Relative 0 %   Basophils Absolute 0.0 0.0 - 0.1 K/uL  Glucose, capillary     Status: Abnormal   Collection Time: 03/02/15  5:46 AM  Result Value Ref Range   Glucose-Capillary 125 (H) 65 - 99 mg/dL  Glucose, capillary     Status: Abnormal   Collection Time: 03/02/15 11:44 AM  Result Value Ref Range   Glucose-Capillary 115 (H) 65 - 99 mg/dL  Glucose, capillary     Status: Abnormal   Collection Time: 03/02/15  4:55 PM  Result Value Ref Range   Glucose-Capillary 109 (H) 65 - 99 mg/dL  Glucose, capillary     Status: Abnormal  Collection Time: 03/02/15 11:12 PM  Result Value Ref Range   Glucose-Capillary 122 (H) 65 - 99 mg/dL   Comment 1 Notify RN   CBC     Status: Abnormal   Collection Time: 03/03/15  5:28 AM  Result Value Ref Range   WBC 8.7 4.0 - 10.5 K/uL   RBC 3.68 (L) 3.87 - 5.11 MIL/uL    Hemoglobin 9.6 (L) 12.0 - 15.0 g/dL   HCT 29.8 (L) 36.0 - 46.0 %   MCV 81.0 78.0 - 100.0 fL   MCH 26.1 26.0 - 34.0 pg   MCHC 32.2 30.0 - 36.0 g/dL   RDW 15.2 11.5 - 15.5 %   Platelets 599 (H) 150 - 400 K/uL  Heparin level (unfractionated)     Status: None   Collection Time: 03/03/15  5:28 AM  Result Value Ref Range   Heparin Unfractionated 0.57 0.30 - 0.70 IU/mL    Comment:        IF HEPARIN RESULTS ARE BELOW EXPECTED VALUES, AND PATIENT DOSAGE HAS BEEN CONFIRMED, SUGGEST FOLLOW UP TESTING OF ANTITHROMBIN III LEVELS.   Comprehensive metabolic panel     Status: Abnormal   Collection Time: 03/03/15  5:28 AM  Result Value Ref Range   Sodium 140 135 - 145 mmol/L   Potassium 4.2 3.5 - 5.1 mmol/L   Chloride 103 101 - 111 mmol/L   CO2 26 22 - 32 mmol/L   Glucose, Bld 130 (H) 65 - 99 mg/dL   BUN 8 6 - 20 mg/dL   Creatinine, Ser 0.75 0.44 - 1.00 mg/dL   Calcium 9.3 8.9 - 10.3 mg/dL   Total Protein 7.1 6.5 - 8.1 g/dL   Albumin 3.0 (L) 3.5 - 5.0 g/dL   AST 21 15 - 41 U/L   ALT 15 14 - 54 U/L   Alkaline Phosphatase 38 38 - 126 U/L   Total Bilirubin 0.3 0.3 - 1.2 mg/dL   GFR calc non Af Amer >60 >60 mL/min   GFR calc Af Amer >60 >60 mL/min    Comment: (NOTE) The eGFR has been calculated using the CKD EPI equation. This calculation has not been validated in all clinical situations. eGFR's persistently <60 mL/min signify possible Chronic Kidney Disease.    Anion gap 11 5 - 15  Magnesium     Status: None   Collection Time: 03/03/15  5:28 AM  Result Value Ref Range   Magnesium 2.2 1.7 - 2.4 mg/dL  Phosphorus     Status: Abnormal   Collection Time: 03/03/15  5:28 AM  Result Value Ref Range   Phosphorus 4.8 (H) 2.5 - 4.6 mg/dL  Glucose, capillary     Status: Abnormal   Collection Time: 03/03/15  5:43 AM  Result Value Ref Range   Glucose-Capillary 129 (H) 65 - 99 mg/dL    Imaging / Studies: Ct Abdomen Pelvis W Contrast  03/01/2015  CLINICAL DATA:  Followup abscess drainage  02/26/2015. History of diverticular abscess. EXAM: CT ABDOMEN AND PELVIS WITH CONTRAST TECHNIQUE: Multidetector CT imaging of the abdomen and pelvis was performed using the standard protocol following bolus administration of intravenous contrast. CONTRAST:  171m OMNIPAQUE IOHEXOL 300 MG/ML  SOLN COMPARISON:  CT scan 02/26/2015 and 02/25/2015 FINDINGS: Lower chest: Small bilateral pleural effusions with overlying atelectasis. Hepatobiliary: Small scattered hepatic cysts are again demonstrated. No worrisome hepatic lesions or intrahepatic biliary dilatation. The gallbladder surgically absent. No common bile duct dilatation. Pancreas: No mass, inflammation or ductal dilatation. Spleen: Normal size.  No  focal lesions. Adrenals/Urinary Tract: The adrenal glands and kidneys are normal. Stomach/Bowel: The stomach, duodenum and small bowel are unremarkable. Persistent but definitely improving diverticulitis involving the sigmoid colon. The diverticular abscess has been completely drained. Vascular/Lymphatic: Small scattered mesenteric and retroperitoneal lymph nodes likely inflammatory. The aorta and branch vessels are normal. The major venous structures are patent. Other: The uterus and ovaries are normal. Small amount of free pelvic fluid. No pelvic abscess. The bladder appears normal. No inguinal mass or adenopathy. Small scattered lymph nodes are stable. Musculoskeletal: No significant findings. IMPRESSION: 1. Persistent but definite interval improvement of sigmoid diverticulitis. 2. Drained diverticular abscess. Electronically Signed   By: Marijo Sanes M.D.   On: 03/01/2015 15:42   Dg Abd 2 Views  03/01/2015  CLINICAL DATA:  Acute diverticulitis EXAM: ABDOMEN - 2 VIEW COMPARISON:  02/26/2015 and 02/22/2015 FINDINGS: There is normal small bowel gas pattern. A drainage catheter is noted in right lower abdomen. Some colonic gas noted throughout the colon without colonic distension. Postcholecystectomy surgical clips  are noted. IMPRESSION: Normal small bowel gas pattern. Some colonic gas noted throughout the colon. No significant colonic distension. Drainage catheter is noted in right lower abdomen Electronically Signed   By: Lahoma Crocker M.D.   On: 03/01/2015 12:07    Medications / Allergies:  Scheduled Meds: . diltiazem  180 mg Oral Daily  . ertapenem  1 g Intravenous Q24H  . insulin aspart  0-9 Units Subcutaneous 4 times per day  . metoprolol  50 mg Oral BID  . ranitidine  150 mg Oral BID  . sodium chloride  3 mL Intravenous Q12H  . zolpidem  5 mg Oral QHS   Continuous Infusions: . Marland KitchenTPN (CLINIMIX-E) Adult 65 mL/hr at 03/02/15 1816   And  . fat emulsion 240 mL (03/02/15 1815)  . Marland KitchenTPN (CLINIMIX-E) Adult     And  . fat emulsion    . heparin 2,200 Units/hr (03/02/15 2235)  . 0.9 % sodium chloride with kcl 10 mL/hr at 03/03/15 0330   PRN Meds:.acetaminophen **OR** acetaminophen, LORazepam, morphine injection, ondansetron **OR** ondansetron (ZOFRAN) IV, phenol, simethicone, sodium chloride, sodium chloride  Antibiotics: Anti-infectives    Start     Dose/Rate Route Frequency Ordered Stop   02/25/15 1600  ertapenem (INVANZ) 1 g in sodium chloride 0.9 % 50 mL IVPB     1 g 100 mL/hr over 30 Minutes Intravenous Every 24 hours 02/25/15 1444     02/22/15 1400  piperacillin-tazobactam (ZOSYN) IVPB 3.375 g  Status:  Discontinued     3.375 g 12.5 mL/hr over 240 Minutes Intravenous 3 times per day 02/22/15 1220 02/25/15 1444   02/22/15 1200  ciprofloxacin (CIPRO) tablet 500 mg  Status:  Discontinued     500 mg Oral 2 times daily 02/22/15 1108 02/22/15 1220   02/21/15 1000  ciprofloxacin (CIPRO) IVPB 400 mg  Status:  Discontinued     400 mg 200 mL/hr over 60 Minutes Intravenous Every 12 hours 02/20/15 2343 02/22/15 1108   02/21/15 0600  metroNIDAZOLE (FLAGYL) IVPB 500 mg  Status:  Discontinued     500 mg 100 mL/hr over 60 Minutes Intravenous Every 8 hours 02/20/15 2343 02/22/15 1220   02/20/15 2200   ciprofloxacin (CIPRO) IVPB 400 mg  Status:  Discontinued     400 mg 200 mL/hr over 60 Minutes Intravenous  Once 02/20/15 2152 02/22/15 1108   02/20/15 2200  metroNIDAZOLE (FLAGYL) IVPB 500 mg     500 mg 100 mL/hr over 60  Minutes Intravenous  Once 02/20/15 2152 02/21/15 0125        Assessment/Plan Recurrent diverticulitis with abscess -s/p IR drain placement 1/6. Repeat CT 1/16 shows improvement.  Although, patient remains tender.  Taking IV pains, add PO pain meds.  ?trial of clears or continue until pain improves.  She is very slow to improve.  -mobilize  CV-AFib, Eliquis on hold . CHF and HTN PCM-prealbumin 5.9(1/16) FEN-NPO ID-Day 3 Flagyl, day 2 Cipro completed, 4 days of Zosyn, Invanz D#6 VTE prophylaxis-SCD/heparin gtt   Erby Pian, ANP-BC [facility] Pager [phone](7A-4:30P)   03/03/2015 10:23 AM.

## 2015-03-03 NOTE — Progress Notes (Addendum)
Patient ID: Crystal Brennan, female   DOB: 04-May-1965, 50 y.o.   MRN: [id number] [facility] PROGRESS NOTE  Crystal Brennan [id number] DOB: 1965-06-15 DOA: 02/20/2015 PCP: No PCP Per Patient will set up with [facility] on discharge   Brief narrative:    50 year-old female with past medical history of atrial fibrillation on anticoagulation with Apixaban, hypertension, obstructive sleep apnea CPAP at nighttime, recurrent diverticulitis (3 prior episodes). Patient presented to [facility] 02/20/2015 with worsening abdominal pain in the lower abdomen for 2 days prior to the admission with no associated nausea or vomiting. She was found to have sigmoid diverticulitis and microperforation. She was treated conservatively with bowel rest, antibiotics, nothing by mouth, IV fluids and surgery has seen her in consultation. She did undergo percutaneous drain placement on 02/26/2015 with no bacteria seen in the culture from the fluid. Repeat CT scan shows improvement in abscess.  Assessment/Plan:    Principal Problem: Perforated sigmoid diverticulitis with pneumoperitoneum - Treated conservatively on the admission with bowel rest, nothing by mouth, IV fluids and pain management. She was also on antibiotics from the time of the admission and currently is on ertapenem - Patient underwent percutaneous drain placement by IR 02/26/2015 with cultures showing no growth so far - Her repeat CT scan shows a diverticular abscess to be completely drained - She is currently on TNA for nutritional support which was started 03/01/2015 in addition to sips of clears if she tolerates it.  Active Problems: Hypokalemia  - Due to GI losses - Now resolved   Anemia of chronic disease - Likely in the setting of chronic anticoagulation - Patient is currently on heparin drip - No reports of bleeding - Hemoglobin is 9.6 this morning  Chronic diastolic CHF - Compensated.  - Monitor volume status while being on TNA, IV  fluids  Proximal A. Fib - CHA2DS2-VASc score: at least 3. - Anticoagulated on Eliquis at home- last dose 02/19/2015.  - Until by mouth intake for the resume she will continue heparin drip - Rate is controlled with Cardizem and metoprolol  Essential hypertension - Continue Cardizem and metoprolol  OSA - Continue nightly CPAP.  Tobacco abuse - Quit in 11/16  Thrombocytosis - Likely reactive  Anxiety - Stable - May use Ativan as needed  Morbid obesity due to excess calorie - Continue TNA for now because of acute illness - Counseled on diet  DVT Prophylaxis  - SCD's bilaterally, heparin drip  - At home she is on Eliquis    Code Status: Full.  Family Communication:  plan of care discussed with the patient Disposition Plan: Home once cleared by surgery   IV access:  Peripheral IV PICC line   Procedures and diagnostic studies:    Ct Abdomen Pelvis W Contrast 03/01/2015  1. Persistent but definite interval improvement of sigmoid diverticulitis. 2. Drained diverticular abscess.   Dg Abd 2 Views 03/01/2015   Normal small bowel gas pattern. Some colonic gas noted throughout the colon. No significant colonic distension. Drainage catheter is noted in right lower abdomen   Dg Abd 1 View 02/22/2015 Oral contrast seen within colon. Diverticulosis noted. No evidence of bowel obstruction.   Ct Abdomen Pelvis W Contrast 02/25/2015 Perforated sigmoid diverticulitis which has worsened compared with 02/20/2015. There is now small volume pneumoperitoneum. Previously seen small amount of contained extraluminal air adjacent to the sigmoid colon is significantly larger measuring approximately 4.9 x 5.6 cm. 2. Small right pleural effusion. Bibasilar airspace disease likely reflecting atelectasis.  These results were called by telephone at the time of interpretation on 02/25/2015 at 1:52 pm to Dr. Barkley Bruns, who verbally acknowledged these results.   Ct Abdomen Pelvis W Contrast 02/20/2015 Sigmoid  diverticulitis. There appears to be 2 sections of the mid sigmoid colon there are inflamed, both projecting in the right lower quadrant. Areas associated extraluminal air reflecting diverticular perforation, but no evidence of an abscess. 2. No other acute findings. 3. Multiple other noninflamed colonic diverticula. 4. Stable small hepatic cysts.  Ct Image Guided Drainage By Percutaneous Catheter 02/26/2015 CT-guided percutaneous catheter drainage performed of a diverticular abscess. A 12 French drain was placed and attached to suction bulb drainage.  Medical Consultants:  Surgery  IAnti-Infectives:   Ertapenem 02/25/15 >>  Cipro 02/20/15 >> 02/22/15 Flagyl 02/20/15 >> 02/22/15 Zosyn 02/22/15 >> 02/25/15   Crystal Brennan, Crystal Brennan  [facility] Pager [phone]  Time spent in minutes: 25 minutes  If 7PM-7AM, please contact night-coverage [email or web address] Password [facility] 03/03/2015, 9:31 AM   LOS: 11 days    HPI/Subjective: No acute overnight events. Patient reports pain with urination.  Objective: Filed Vitals:   03/02/15 0551 03/02/15 1614 03/02/15 2300 03/03/15 0700  BP: 120/54 126/91 136/83 132/81  Pulse: 88 86 95 94  Temp: 98.4 F (36.9 C) 98.7 F (37.1 C) 99 F (37.2 C) 98.6 F (37 C)  TempSrc: Oral Oral Oral Oral  Resp: 20 20 20 20   Height:      Weight:      SpO2: 96% 97% 95% 98%    Intake/Output Summary (Last 24 hours) at 03/03/15 0931 Last data filed at 03/03/15 0911  Gross per 24 hour  Intake    145 ml  Output     40 ml  Net    105 ml    Exam:   General:  Pt is alert, follows commands appropriately, not in acute distress  Cardiovascular: Regular rate and rhythm, S1/S2 (+)  Respiratory: Clear to auscultation bilaterally, no wheezing, no crackles, no rhonchi  Abdomen: JP drain in place, tender in lower abdomen without guarding, (+) BS  Extremities: No edema, pulses DP and PT palpable bilaterally  Neuro: Grossly nonfocal  Data Reviewed: Basic Metabolic  Panel:  Recent Labs Lab 02/27/15 0247 02/28/15 0610 03/01/15 0705 03/02/15 0455 03/03/15 0528  NA 139 139 139 138 140  K 3.8 3.9 4.0 3.7 4.2  CL 108 105 105 101 103  CO2 22 22 24 26 26   GLUCOSE 93 92 95 116* 130*  BUN 5* <5* <5* 5* 8  CREATININE 0.74 0.59 0.61 0.59 0.75  CALCIUM 8.3* 8.4* 8.5* 8.7* 9.3  MG  --   --   --  2.3 2.2  PHOS  --   --   --  5.1* 4.8*   Liver Function Tests:  Recent Labs Lab 02/25/15 0545 03/01/15 1110 03/02/15 0455 03/03/15 0528  AST 11* 19 20 21   ALT 9* 24 13* 15  ALKPHOS 35* 35* 37* 38  BILITOT 0.8 0.6 0.6 0.3  PROT 6.1* 6.4* 6.7 7.1  ALBUMIN 2.8* 2.7* 2.9* 3.0*   No results for input(s): LIPASE, AMYLASE in the last 168 hours. No results for input(s): AMMONIA in the last 168 hours. CBC:  Recent Labs Lab 02/27/15 0247 02/28/15 0610 03/01/15 0705 03/02/15 0455 03/03/15 0528  WBC 7.8 7.2 6.5 7.2 8.7  NEUTROABS  --   --   --  5.0  --   HGB 8.5* 8.7* 8.9* 9.2* 9.6*  HCT 26.1* 26.6* 27.0* 28.0*  29.8*  MCV 81.8 81.6 80.6 81.2 81.0  PLT 372 414* 468* 523* 599*   Cardiac Enzymes: No results for input(s): CKTOTAL, CKMB, CKMBINDEX, TROPONINI in the last 168 hours. BNP: Invalid input(s): POCBNP CBG:  Recent Labs Lab 03/02/15 0546 03/02/15 1144 03/02/15 1655 03/02/15 2312 03/03/15 0543  GLUCAP 125* 115* 109* 122* 129*    Surgical pcr screen     Status: Abnormal   Collection Time: 02/26/15  5:34 AM  Result Value Ref Range Status   MRSA, PCR NEGATIVE NEGATIVE Final   Staphylococcus aureus POSITIVE (A) NEGATIVE Final  Culture, routine-abscess     Status: None   Collection Time: 02/26/15  3:19 PM  Result Value Ref Range Status   Specimen Description ABSCESS DIVERTICULAR  Final   Special Requests NONE  Final   Gram Stain   Final   Culture   Final    NO GROWTH 2 DAYS Performed at [facility]    Report Status 03/01/2015 FINAL  Final  Anaerobic culture     Status: None (Preliminary result)   Collection Time:  02/26/15  3:19 PM  Result Value Ref Range Status   Specimen Description ABSCESS DIVERTICULAR  Final   Special Requests NONE  Final   Gram Stain   Final   Culture   Final    NO ANAEROBES ISOLATED; CULTURE IN PROGRESS FOR 5 DAYS Performed at [facility]    Report Status PENDING  Incomplete     Scheduled Meds: . diltiazem  180 mg Oral Daily  . ertapenem  1 g Intravenous Q24H  . insulin aspart  0-9 Units Subcutaneous 4 times per day  . metoprolol  50 mg Oral BID  . ranitidine  150 mg Oral BID  . sodium chloride  3 mL Intravenous Q12H  . zolpidem  5 mg Oral QHS   Continuous Infusions: . Marland KitchenTPN (CLINIMIX-E) Adult 65 mL/hr at 03/02/15 1816   And  . fat emulsion 240 mL (03/02/15 1815)  . heparin 2,200 Units/hr (03/02/15 2235)  . 0.9 % sodium chloride with kcl 10 mL/hr at 03/03/15 0330

## 2015-03-03 NOTE — Progress Notes (Signed)
PARENTERAL NUTRITION CONSULT NOTE - follow up  Pharmacy Consult for TPN Indication: prolonged malnutrition, possible bowel obstruction  Allergies  Allergen Reactions  . Benadryl [Diphenhydramine Hcl] Hives and Itching  . Codeine Nausea And Vomiting  . Dilaudid [Hydromorphone Hcl]     "feels like she dying '  . Percocet [Oxycodone-Acetaminophen] Hives and Itching    Patient Measurements: Height: 5\' 2"  (157.5 cm) Weight: 256 lb (116.121 kg) IBW/kg (Calculated) : 50.1 Adjusted Body Weight: 67 kg  Vital Signs: Temp: 98.6 F (37 C) (01/18 0700) Temp Source: Oral (01/18 0700) BP: 132/81 mmHg (01/18 0700) Pulse Rate: 94 (01/18 0700) Intake/Output from previous day: 01/17 0701 - 01/18 0700 In: 145 [I.V.:120] Out: 40 [Drains:40] Intake/Output from this shift:    Labs:  Recent Labs  03/01/15 0705 03/02/15 0455 03/03/15 0528  WBC 6.5 7.2 8.7  HGB 8.9* 9.2* 9.6*  HCT 27.0* 28.0* 29.8*  PLT 468* 523* 599*     Recent Labs  03/01/15 0705 03/01/15 1110 03/02/15 0453 03/02/15 0455 03/03/15 0528  NA 139  --   --  138 140  K 4.0  --   --  3.7 4.2  CL 105  --   --  101 103  CO2 24  --   --  26 26  GLUCOSE 95  --   --  116* 130*  BUN <5*  --   --  5* 8  CREATININE 0.61  --   --  0.59 0.75  CALCIUM 8.5*  --   --  8.7* 9.3  MG  --   --   --  2.3 2.2  PHOS  --   --   --  5.1* 4.8*  PROT  --  6.4*  --  6.7 7.1  ALBUMIN  --  2.7*  --  2.9* 3.0*  AST  --  19  --  20 21  ALT  --  24  --  13* 15  ALKPHOS  --  35*  --  37* 38  BILITOT  --  0.6  --  0.6 0.3  BILIDIR  --  <0.1*  --   --   --   IBILI  --  NOT CALCULATED  --   --   --   PREALBUMIN  --  5.9* 7.2*  --   --   TRIG  --   --   --  158*  --    Estimated Creatinine Clearance: 100.5 mL/min (by C-G formula based on Cr of 0.75).    Recent Labs  03/02/15 1655 03/02/15 2312 03/03/15 0543  GLUCAP 109* 122* 129*    Medical History: History reviewed. No pertinent past medical history.  Medications:   Scheduled:  . diltiazem  180 mg Oral Daily  . ertapenem  1 g Intravenous Q24H  . insulin aspart  0-9 Units Subcutaneous 4 times per day  . metoprolol  50 mg Oral BID  . ranitidine  150 mg Oral BID  . sodium chloride  3 mL Intravenous Q12H  . zolpidem  5 mg Oral QHS   Infusions:  . Marland KitchenTPN (CLINIMIX-E) Adult 65 mL/hr at 03/02/15 1816   And  . fat emulsion 240 mL (03/02/15 1815)  . heparin 2,200 Units/hr (03/02/15 2235)  . 0.9 % sodium chloride with kcl 10 mL/hr at 03/03/15 0330    Insulin Requirements: 2 unit SSI  Current Nutrition:  NPO  IVF: NS with 27mEq KCL at 10 ml/hr  Central access: PICC TPN start date: 1/16  ASSESSMENT                                                                                                          HPI: Patient known to pharmacy for dosing of IV heparin. Per CCS, patient not improving since drain placement last week and has not eaten since 1/7. Plan per CCS to check for obstruction and start TPN  Significant events:  1/17: plan per CCS to continue conservative approach for now with no surgery, CT improving  Today, 03/03/2015  Glucose - at goal - <150  Electrolytes - stable,  Phos improved but still high - monitor for now  Renal - SCr stable  LFTs - stable  TGs - 158 (1/17)  Prealbumin - 5.8 (1/12), 7.2 (1/17)  NUTRITIONAL GOALS                                                                                             RD recs: Goals/day: Kcal 1750-1950, protein 85-95g, >= 2L  Clinimix E 5/15 at a goal rate of 45ml/hr + 20% fat emulsion at 8ml/hr to provide: 100g/day protein, 1919Kcal/day. Note used adjusted body weight for calculations due to obesity  PLAN                                                                                                                         At 1800 today:  Increase Clinimix E 5/15 to 49ml/hr.  20% fat emulsion at 51ml/hr.  TPN to contain standard multivitamins and trace elements.  continue  IVF at  [facility].  Continue CBG/SSI q6.   TPN lab panels on Mondays & Thursdays.  Monitor phos closely - if continues to rise will need to remove lytes from [place] 03/03/2015, 10:02 AM Pager [phone]

## 2015-03-03 NOTE — Progress Notes (Signed)
ANTICOAGULATION CONSULT NOTE - Follow Up Consult  Pharmacy Consult for Heparin Indication: bridge therapy for Afib while off Apixaban  Allergies  Allergen Reactions  . Benadryl [Diphenhydramine Hcl] Hives and Itching  . Codeine Nausea And Vomiting  . Dilaudid [Hydromorphone Hcl]     "feels like she dying '  . Percocet [Oxycodone-Acetaminophen] Hives and Itching   Patient Measurements: Height: 5\' 2"  (157.5 cm) Weight: 256 lb (116.121 kg) IBW/kg (Calculated) : 50.1 HEPARIN DW (KG): 78.7  Vital Signs: Temp: 98.6 F (37 C) (01/18 0700) Temp Source: Oral (01/18 0700) BP: 132/81 mmHg (01/18 0700) Pulse Rate: 94 (01/18 0700)  Labs:  Recent Labs  03/01/15 0705 03/02/15 0455 03/03/15 0528  HGB 8.9* 9.2* 9.6*  HCT 27.0* 28.0* 29.8*  PLT 468* 523* 599*  HEPARINUNFRC 0.53 0.53 0.57  CREATININE 0.61 0.59 0.75    Estimated Creatinine Clearance: 100.5 mL/min (by C-G formula based on Cr of 0.75).  Medications:  Infusions:  . Marland KitchenTPN (CLINIMIX-E) Adult 65 mL/hr at 03/02/15 1816   And  . fat emulsion 240 mL (03/02/15 1815)  . heparin 2,200 Units/hr (03/02/15 2235)  . 0.9 % sodium chloride with kcl 10 mL/hr at 03/03/15 0330  PTA: apixaban 5mg  BID LD 1/6@ 2100  Assessment: 50 yo F admitted with sigmoid diverticulitis and microperforation currently being treated conservatively with bowel rest and IV antibiotics.  She is on chronic Apixaban for hx Afib which was held on admission.  Pharmacy asked to manage heparin bridge while off apixaban.  Heparin held 1/13 for IR drain placement, Heparin resumption delayed by loss of IV access.   PICC placed 1/14  03/03/2015:  Heparin level continues to be therapeutic on current rate of 2200 units/hr  CBC stable  No reported bleeding except slight vaginal bleeding overnight 1/13.  Goal of Therapy:  Heparin level 0.3-0.7 units/ml Monitor platelets by anticoagulation protocol: Yes   Plan:   Continue Heparin at 2200 units/hr  Daily  Heparin level, CBC  Per notes, continuing conservative approach per CCS   Dolly Rias RPh 03/03/2015, 9:54 AM Pager [phone]

## 2015-03-03 NOTE — Progress Notes (Signed)
Patient ID: Crystal Brennan, female   DOB: August 06, 1965, 50 y.o.   MRN: [id number]    Referring Physician(s): CCS  Chief Complaint: Diverticular abscess  Subjective: Pt still having some intermittent abd/pelvic discomfort; has ambulated; has some abd pain with voiding; no nausea/vomiting  Allergies: Benadryl; Codeine; Dilaudid; and Percocet  Medications: Prior to Admission medications   Medication Sig Start Date End Date Taking? Authorizing Provider  apixaban (ELIQUIS) 5 MG TABS tablet Take 1 tablet (5 mg total) by mouth 2 (two) times daily. 01/26/15  Yes Lelon Perla, MD  Aspirin Effervescent (ALKA-SELTZER PO) Take 2 tablets by mouth 2 (two) times daily as needed (stomach pains).   Yes Historical Provider, MD  Chlorpheniramine Maleate (CHLOR-TRIMETON PO) Take 2 tablets by mouth daily as needed (allergies).   Yes Historical Provider, MD  diltiazem (CARDIZEM CD) 180 MG 24 hr capsule Take 1 capsule (180 mg total) by mouth daily. 01/26/15  Yes Lelon Perla, MD  hydrochlorothiazide (HYDRODIURIL) 25 MG tablet Take 1 tablet (25 mg total) by mouth daily. 01/26/15  Yes Lelon Perla, MD  ibuprofen (ADVIL,MOTRIN) 200 MG tablet Take 800 mg by mouth every 6 (six) hours as needed for moderate pain or cramping.   Yes Historical Provider, MD  metoprolol (LOPRESSOR) 50 MG tablet Take 1 tablet (50 mg total) by mouth 2 (two) times daily. 01/26/15  Yes Lelon Perla, MD  ketorolac (TORADOL) 10 MG tablet Take 1 tablet (10 mg total) by mouth every 6 (six) hours as needed. 04/18/14   Larene Pickett, PA-C     Vital Signs: BP 111/64 mmHg  Pulse 73  Temp(Src) 98.3 F (36.8 C) (Oral)  Resp 18  Ht 5\' 2"  (1.575 m)  Wt 256 lb (116.121 kg)  BMI 46.81 kg/m2  SpO2 94%  LMP 02/22/2015  Physical Exam awake but sl drowsy; RLQ drain intact, irrigated with 5 cc sterile NS with return of same amt; output 40 cc; cx's neg  Imaging: Ct Abdomen Pelvis W Contrast  03/01/2015  CLINICAL DATA:  Followup  abscess drainage 02/26/2015. History of diverticular abscess. EXAM: CT ABDOMEN AND PELVIS WITH CONTRAST TECHNIQUE: Multidetector CT imaging of the abdomen and pelvis was performed using the standard protocol following bolus administration of intravenous contrast. CONTRAST:  178mL OMNIPAQUE IOHEXOL 300 MG/ML  SOLN COMPARISON:  CT scan 02/26/2015 and 02/25/2015 FINDINGS: Lower chest: Small bilateral pleural effusions with overlying atelectasis. Hepatobiliary: Small scattered hepatic cysts are again demonstrated. No worrisome hepatic lesions or intrahepatic biliary dilatation. The gallbladder surgically absent. No common bile duct dilatation. Pancreas: No mass, inflammation or ductal dilatation. Spleen: Normal size.  No focal lesions. Adrenals/Urinary Tract: The adrenal glands and kidneys are normal. Stomach/Bowel: The stomach, duodenum and small bowel are unremarkable. Persistent but definitely improving diverticulitis involving the sigmoid colon. The diverticular abscess has been completely drained. Vascular/Lymphatic: Small scattered mesenteric and retroperitoneal lymph nodes likely inflammatory. The aorta and branch vessels are normal. The major venous structures are patent. Other: The uterus and ovaries are normal. Small amount of free pelvic fluid. No pelvic abscess. The bladder appears normal. No inguinal mass or adenopathy. Small scattered lymph nodes are stable. Musculoskeletal: No significant findings. IMPRESSION: 1. Persistent but definite interval improvement of sigmoid diverticulitis. 2. Drained diverticular abscess. Electronically Signed   By: Marijo Sanes M.D.   On: 03/01/2015 15:42   Dg Abd 2 Views  03/01/2015  CLINICAL DATA:  Acute diverticulitis EXAM: ABDOMEN - 2 VIEW COMPARISON:  02/26/2015 and 02/22/2015 FINDINGS: There is normal  small bowel gas pattern. A drainage catheter is noted in right lower abdomen. Some colonic gas noted throughout the colon without colonic distension.  Postcholecystectomy surgical clips are noted. IMPRESSION: Normal small bowel gas pattern. Some colonic gas noted throughout the colon. No significant colonic distension. Drainage catheter is noted in right lower abdomen Electronically Signed   By: Lahoma Crocker M.D.   On: 03/01/2015 12:07    Labs:  CBC:  Recent Labs  02/28/15 0610 03/01/15 0705 03/02/15 0455 03/03/15 0528  WBC 7.2 6.5 7.2 8.7  HGB 8.7* 8.9* 9.2* 9.6*  HCT 26.6* 27.0* 28.0* 29.8*  PLT 414* 468* 523* 599*    COAGS:  Recent Labs  02/21/15 1600 02/21/15 2322 02/26/15 0700  INR  --   --  1.27  APTT 34 55*  --     BMP:  Recent Labs  02/28/15 0610 03/01/15 0705 03/02/15 0455 03/03/15 0528  NA 139 139 138 140  K 3.9 4.0 3.7 4.2  CL 105 105 101 103  CO2 22 24 26 26   GLUCOSE 92 95 116* 130*  BUN <5* <5* 5* 8  CALCIUM 8.4* 8.5* 8.7* 9.3  CREATININE 0.59 0.61 0.59 0.75  GFRNONAA >60 >60 >60 >60  GFRAA >60 >60 >60 >60    LIVER FUNCTION TESTS:  Recent Labs  02/25/15 0545 03/01/15 1110 03/02/15 0455 03/03/15 0528  BILITOT 0.8 0.6 0.6 0.3  AST 11* 19 20 21   ALT 9* 24 13* 15  ALKPHOS 35* 35* 37* 38  PROT 6.1* 6.4* 6.7 7.1  ALBUMIN 2.8* 2.7* 2.9* 3.0*    Assessment and Plan: S/p drainage of RLQ diverticular abscess 1/13; AF; WBC nl; hgb stable; creat ok; fluid cx's neg to date; latest CT shows drained abscesss RLQ; will need drain injection prior to removal and preferably when less than 15-20 cc /day of output excluding flush amt recorded; UA pend   Electronically Signed: D. Rowe Robert 03/03/2015, 2:58 PM   I spent a total of 15 minutes at the the patient's bedside AND on the patient's hospital floor or unit, greater than 50% of which was counseling/coordinating care for pelvic abscess drain

## 2015-03-03 NOTE — Clinical Social Work Note (Signed)
CSW met with pt at bedside; pt prev asked CSW about applying for Medicaid. CSW spoke to a financial counselor who confirmed a Medicaid application can't be started since pt is already covered through her current employer. CSW directed pt to social services upon d/c. Pt was accepting of this news and agrees to follow up with DSS upon d/c.   Cindra Presume, LCSW [phone] [facility] & 5E, 5W 22-58 Licensed Clinical Social Worker

## 2015-03-03 NOTE — Progress Notes (Signed)
Pt wants CPAP at midnight.  RT to monitor and assess as needed.

## 2015-03-04 ENCOUNTER — Inpatient Hospital Stay (HOSPITAL_COMMUNITY): Payer: Commercial Managed Care - PPO

## 2015-03-04 DIAGNOSIS — F419 Anxiety disorder, unspecified: Secondary | ICD-10-CM

## 2015-03-04 DIAGNOSIS — D473 Essential (hemorrhagic) thrombocythemia: Secondary | ICD-10-CM

## 2015-03-04 DIAGNOSIS — N732 Unspecified parametritis and pelvic cellulitis: Secondary | ICD-10-CM

## 2015-03-04 LAB — CBC
HEMATOCRIT: 29.5 % — AB (ref 36.0–46.0)
Hemoglobin: 9.3 g/dL — ABNORMAL LOW (ref 12.0–15.0)
MCH: 25.9 pg — AB (ref 26.0–34.0)
MCHC: 31.5 g/dL (ref 30.0–36.0)
MCV: 82.2 fL (ref 78.0–100.0)
PLATELETS: 637 10*3/uL — AB (ref 150–400)
RBC: 3.59 MIL/uL — AB (ref 3.87–5.11)
RDW: 15.5 % (ref 11.5–15.5)
WBC: 7.1 10*3/uL (ref 4.0–10.5)

## 2015-03-04 LAB — COMPREHENSIVE METABOLIC PANEL
ALT: 13 U/L — AB (ref 14–54)
ANION GAP: 11 (ref 5–15)
AST: 20 U/L (ref 15–41)
Albumin: 2.9 g/dL — ABNORMAL LOW (ref 3.5–5.0)
Alkaline Phosphatase: 37 U/L — ABNORMAL LOW (ref 38–126)
BUN: 10 mg/dL (ref 6–20)
CHLORIDE: 101 mmol/L (ref 101–111)
CO2: 26 mmol/L (ref 22–32)
CREATININE: 0.62 mg/dL (ref 0.44–1.00)
Calcium: 8.9 mg/dL (ref 8.9–10.3)
GFR calc non Af Amer: >60 mL/min (ref >60)
Glucose, Bld: 123 mg/dL — ABNORMAL HIGH (ref 65–99)
POTASSIUM: 4 mmol/L (ref 3.5–5.1)
Sodium: 138 mmol/L (ref 135–145)
Total Bilirubin: 0.6 mg/dL (ref 0.3–1.2)
Total Protein: 6.9 g/dL (ref 6.5–8.1)

## 2015-03-04 LAB — GLUCOSE, CAPILLARY
GLUCOSE-CAPILLARY: 139 mg/dL — AB (ref 65–99)
GLUCOSE-CAPILLARY: 125 mg/dL — AB (ref 65–99)
GLUCOSE-CAPILLARY: 144 mg/dL — AB (ref 65–99)
Glucose-Capillary: 137 mg/dL — ABNORMAL HIGH (ref 65–99)

## 2015-03-04 LAB — ANAEROBIC CULTURE

## 2015-03-04 LAB — HEPARIN LEVEL (UNFRACTIONATED)
HEPARIN UNFRACTIONATED: 0.54 [IU]/mL (ref 0.30–0.70)
HEPARIN UNFRACTIONATED: 0.44 [IU]/mL (ref 0.30–0.70)
Heparin Unfractionated: 0.69 IU/mL (ref 0.30–0.70)

## 2015-03-04 LAB — PHOSPHORUS: PHOSPHORUS: 4.8 mg/dL — AB (ref 2.5–4.6)

## 2015-03-04 LAB — MAGNESIUM: Magnesium: 2.3 mg/dL (ref 1.7–2.4)

## 2015-03-04 MED ORDER — TRACE MINERALS CR-CU-MN-SE-ZN 10-1000-500-60 MCG/ML IV SOLN
INTRAVENOUS | Status: AC
  Administered 2015-03-04: 18:00:00 via INTRAVENOUS
  Filled 2015-03-04: qty 1992

## 2015-03-04 MED ORDER — INSULIN ASPART 100 UNIT/ML ~~LOC~~ SOLN
0.0000 [IU] | Freq: Three times a day (TID) | SUBCUTANEOUS | Status: DC
  Administered 2015-03-04 (×2): 1 [IU] via SUBCUTANEOUS
  Administered 2015-03-05: 2 [IU] via SUBCUTANEOUS

## 2015-03-04 MED ORDER — CLOTRIMAZOLE 1 % VA CREA
1.0000 | TOPICAL_CREAM | Freq: Every day | VAGINAL | Status: DC
  Administered 2015-03-04 – 2015-03-05 (×2): 1 via VAGINAL
  Filled 2015-03-04: qty 45

## 2015-03-04 MED ORDER — MORPHINE SULFATE (PF) 2 MG/ML IV SOLN
1.0000 mg | INTRAVENOUS | Status: DC | PRN
  Administered 2015-03-04: 4 mg via INTRAVENOUS

## 2015-03-04 MED ORDER — FAT EMULSION 20 % IV EMUL
240.0000 mL | INTRAVENOUS | Status: AC
  Administered 2015-03-04: 240 mL via INTRAVENOUS
  Filled 2015-03-04: qty 250

## 2015-03-04 MED ORDER — HYDROCODONE-ACETAMINOPHEN 5-325 MG PO TABS
1.0000 | ORAL_TABLET | ORAL | Status: DC | PRN
  Administered 2015-03-04 – 2015-03-06 (×7): 2 via ORAL
  Filled 2015-03-04 (×7): qty 2

## 2015-03-04 MED ORDER — IOHEXOL 300 MG/ML  SOLN
5.0000 mL | Freq: Once | INTRAMUSCULAR | Status: AC | PRN
  Administered 2015-03-04: 5 mL

## 2015-03-04 NOTE — Progress Notes (Signed)
Pharmacy Consult Note - Heparin Follow Up  Labs: heparin level 0.54  A/P: Heparin level therapeutic (goal 0.3-0.7) on current rate of 1950 units/hr. No reported bleeding or problems with heparin line per RN. Will recheck heparin level in 8 hours to confirm continued goal level at current rate   Adrian Saran, PharmD, BCPS Pager [phone] 03/04/2015 3:05 PM

## 2015-03-04 NOTE — Progress Notes (Signed)
Patient ID: Crystal Brennan, female   DOB: [birth year], 50 y.o.   MRN: [id number]    Subjective: Pt feeling better.  Tolerating clear liquids. Still hurts to void some, but denies pneumaturia, or stool in her urine  Objective: Vital signs in last 24 hours: Temp:  [98.3 F (36.8 C)-99.3 F (37.4 C)] 98.4 F (36.9 C) (01/19 0615) Pulse Rate:  [73-90] 87 (01/19 0615) Resp:  [18-20] 20 (01/19 0615) BP: (111-133)/(64-73) 119/71 mmHg (01/19 0615) SpO2:  [94 %-98 %] 97 % (01/19 0615) Last BM Date: 03/02/15  Intake/Output from previous day: 01/18 0701 - 01/19 0700 In: 1352.5 [I.V.:253; TPN:1069.5] Out: 13 [Drains:51] Intake/Output this shift:    PE: Abd: soft, still some mild tenderness in LLQ, but patient states this is better than yesterday, +BS, drain with no current output, obese Heart: regular Lungs: CTAB  Lab Results:   Recent Labs  03/03/15 0528 03/04/15 0427  WBC 8.7 7.1  HGB 9.6* 9.3*  HCT 29.8* 29.5*  PLT 599* 637*   BMET  Recent Labs  03/03/15 0528 03/04/15 0427  NA 140 138  K 4.2 4.0  CL 103 101  CO2 26 26  GLUCOSE 130* 123*  BUN 8 10  CREATININE 0.75 0.62  CALCIUM 9.3 8.9   PT/INR No results for input(s): LABPROT, INR in the last 72 hours. CMP     Component Value Date/Time   NA 138 03/04/2015 0427   K 4.0 03/04/2015 0427   CL 101 03/04/2015 0427   CO2 26 03/04/2015 0427   GLUCOSE 123* 03/04/2015 0427   BUN 10 03/04/2015 0427   CREATININE 0.62 03/04/2015 0427   CALCIUM 8.9 03/04/2015 0427   PROT 6.9 03/04/2015 0427   ALBUMIN 2.9* 03/04/2015 0427   AST 20 03/04/2015 0427   ALT 13* 03/04/2015 0427   ALKPHOS 37* 03/04/2015 0427   BILITOT 0.6 03/04/2015 0427   GFRNONAA >60 03/04/2015 0427   GFRAA >60 03/04/2015 0427   Lipase     Component Value Date/Time   LIPASE 19 02/20/2015 1950       Studies/Results: No results found.  Anti-infectives: Anti-infectives    Start     Dose/Rate Route Frequency Ordered Stop   02/25/15 1600   ertapenem (INVANZ) 1 g in sodium chloride 0.9 % 50 mL IVPB     1 g 100 mL/hr over 30 Minutes Intravenous Every 24 hours 02/25/15 1444     02/22/15 1400  piperacillin-tazobactam (ZOSYN) IVPB 3.375 g  Status:  Discontinued     3.375 g 12.5 mL/hr over 240 Minutes Intravenous 3 times per day 02/22/15 1220 02/25/15 1444   02/22/15 1200  ciprofloxacin (CIPRO) tablet 500 mg  Status:  Discontinued     500 mg Oral 2 times daily 02/22/15 1108 02/22/15 1220   02/21/15 1000  ciprofloxacin (CIPRO) IVPB 400 mg  Status:  Discontinued     400 mg 200 mL/hr over 60 Minutes Intravenous Every 12 hours 02/20/15 2343 02/22/15 1108   02/21/15 0600  metroNIDAZOLE (FLAGYL) IVPB 500 mg  Status:  Discontinued     500 mg 100 mL/hr over 60 Minutes Intravenous Every 8 hours 02/20/15 2343 02/22/15 1220   02/20/15 2200  ciprofloxacin (CIPRO) IVPB 400 mg  Status:  Discontinued     400 mg 200 mL/hr over 60 Minutes Intravenous  Once 02/20/15 2152 02/22/15 1108   02/20/15 2200  metroNIDAZOLE (FLAGYL) IVPB 500 mg     500 mg 100 mL/hr over 60 Minutes Intravenous  Once 02/20/15 2152  02/21/15 0125       Assessment/Plan  Recurrent diverticulitis with abscess -s/p IR drain placement 1/6. Repeat CT 1/16 shows improvement. Although, patient remains tender. -try to transition from IV to oral pain meds.  vicodin ordered and morphine changed to breakthrough only -advance to full liquids -will d/w  Dr. Dalbert Batman regarding her drain.  Her abscess was resolved on her CT scan, will d/w if this can be DC -mobilize  CV-AFib, Eliquis on hold . CHF and HTN PCM-prealbumin 5.9(1/16) FEN-fulls ID-Day 3 Flagyl, day 2 Cipro completed, 4 days of Zosyn, Invanz D#7 VTE prophylaxis-SCD/heparin gtt   LOS: 12 days    Kalyiah Saintil E 03/04/2015, 8:34 AM Pager: [phone]

## 2015-03-04 NOTE — Progress Notes (Signed)
Physical Therapy Treatment Patient Details Name: NORITA DAVAULT MRN: [id number] DOB: 11-30-1965 Today's Date: 03/04/2015    History of Present Illness 50 y.o. female with h/o obesity, CHF, a fib admitted with diverticulitis of colon with perforation. S/P abscess drainage 02/26/15.     PT Comments    Pt progressing, pt is reluctant to amb with assistive device but feels somewhat unsteady without it; see below for details;   Follow Up Recommendations  Supervision for mobility/OOB;No PT follow up     Equipment Recommendations  Other (comment) (SPC--if pt will use)    Recommendations for Other Services       Precautions / Restrictions Precautions Precautions: Fall Precaution Comments: JP drain    Mobility  Bed Mobility Overal bed mobility: Needs Assistance Bed Mobility: Supine to Sit;Sit to Supine     Supine to sit: Supervision Sit to supine: Supervision   General bed mobility comments: incr time, cues to complete without physical assist,  lowered HOB  to 30* and pt very unhappy althopugh still able to complete transitions with supervision  Transfers Overall transfer level: Needs assistance Equipment used: None Transfers: Sit to/from Stand Sit to Stand: Supervision         General transfer comment: incr time   Ambulation/Gait Ambulation/Gait assistance: Supervision;Min guard Ambulation Distance (Feet): 300 Feet Assistive device: None Gait Pattern/deviations: Step-through pattern;Decreased stride length;Wide base of support   Gait velocity interpretation: Below normal speed for age/gender General Gait Details: pt does like RW, does not want to use cane but feels unsteady without support; discussed use of cane at length with pt regarding balance and efficiency of gait, dtr encouraging  pt; after amb with PT pt reported she would like to try cane  when she amb with her husb--instructed pt in and demo's use of cane for pt   Stairs            Wheelchair  Mobility    Modified Rankin (Stroke Patients Only)       Balance                                    Cognition Arousal/Alertness: Awake/alert Behavior During Therapy: WFL for tasks assessed/performed Overall Cognitive Status: Within Functional Limits for tasks assessed                      Exercises      General Comments General comments (skin integrity, edema, etc.): pt felt too fatigued for ther ex (walked 2x prior to PT today with family)      Pertinent Vitals/Pain Pain Assessment: 0-10 Pain Score: 5  Pain Location: lower abdomen Pain Descriptors / Indicators: Sore;Constant Pain Intervention(s): Limited activity within patient's tolerance;Monitored during session    Home Living                      Prior Function            PT Goals (current goals can now be found in the care plan section) Acute Rehab PT Goals Patient Stated Goal: to sing PT Goal Formulation: With patient/family Time For Goal Achievement: 03/15/15 Potential to Achieve Goals: Good Progress towards PT goals: Progressing toward goals    Frequency  Min 2X/week    PT Plan Current plan remains appropriate    Co-evaluation             End of Session  Activity Tolerance: Patient tolerated treatment well Patient left: in bed;with call bell/phone within reach;with family/visitor present     Time: 1444-1459 PT Time Calculation (min) (ACUTE ONLY): 15 min  Charges:  $Gait Training: 8-22 mins                    G Codes:      Alveda Vanhorne March 28, 2015, 3:06 PM

## 2015-03-04 NOTE — Care Management Note (Signed)
Case Management Note  Patient Details  Name: Crystal Brennan MRN: [id number] Date of Birth: [birth year]  Subjective/Objective:   Advancing diet-fulls, iv abx. Home rw needed prior to d/c-[facility] dme rep Lecretia aware. Await home rw order.                 Action/Plan:d/c plan home w/dme-rw.   Expected Discharge Date:                  Expected Discharge Plan:  Home/Self Care  In-House Referral:     Discharge planning Services  CM Consult  Post Acute Care Choice:    Choice offered to:     DME Arranged:    DME Agency:     HH Arranged:    HH Agency:     Status of Service:  In process, will continue to follow  Medicare Important Message Given:    Date Medicare IM Given:    Medicare IM give by:    Date Additional Medicare IM Given:    Additional Medicare Important Message give by:     If discussed at [place] of Stay Meetings, dates discussed:    Additional Comments:  Dessa Phi, RN 03/04/2015, 1:18 PM

## 2015-03-04 NOTE — Progress Notes (Signed)
Pharmacy Consult Note - Heparin Follow Up  Labs: heparin level 0.44  A/P: Heparin level therapeutic (goal 0.3-0.7) on current rate of 1950 units/hr. No reported bleeding or problems with heparin noted.  Will recheck heparin level in AM.  Leone Haven, PharmD  03/04/2015 10:58 PM

## 2015-03-04 NOTE — Progress Notes (Addendum)
Patient ID: Crystal Brennan, female   DOB: May 29, 1965, 50 y.o.   MRN: [id number] [facility] PROGRESS NOTE  Crystal Brennan [id number] DOB: Jan 08, 1966 DOA: 02/20/2015 PCP: No PCP Per Patient will set up with [facility] on discharge   Brief narrative:    50 year-old female with past medical history of atrial fibrillation on anticoagulation with Apixaban, hypertension, obstructive sleep apnea CPAP at nighttime, recurrent diverticulitis (3 prior episodes). Patient presented to [facility] 02/20/2015 with worsening abdominal pain in the lower abdomen for 2 days prior to the admission with no associated nausea or vomiting. She was found to have sigmoid diverticulitis and microperforation. She was treated conservatively with bowel rest, antibiotics, nothing by mouth, IV fluids and surgery has seen her in consultation. She did undergo percutaneous drain placement on 02/26/2015 with no bacteria seen in the culture from the fluid. Repeat CT scan shows improvement in abscess.  Assessment/Plan:    Principal Problem: Perforated sigmoid diverticulitis with pneumoperitoneum/ Lower abdominal pain / Pelvic abscess in female - Patient was treated conservatively with bowel rest, nothing by mouth, IV fluids and pain management. - Patient was on few antibiotics on admission, cipro, flagyl,zosyn. Currently on ertapenem since 02/25/2015 - Patient is s/p percutaneous drain placement by IR 02/26/2015 with cultures negative so far - CT scan repeated 02/25/2015 demonstrated a diverticular abscess to be completely drained - Currently on full liquid diet in addition to TNA for nutritional support. She was started on TNA 03/01/2015.  Active Problems: Hypokalemia  - Secondary to GI losses - Potassium now remains within normal limits  Anemia of chronic disease - Secondary to chronic anticoagulation - On heparin drip at this time - No bleeding - Hemoglobin remains stable, 9.3  Chronic diastolic CHF - Compensated.   - Stable respiratory status  Proximal A. Fib - CHA2DS2-VASc score: at least 3. - On Eliquis at home- last dose 02/19/2015.  - Currently on heparin drip  - Rate controlled with Cardizem and metoprolol  Essential hypertension - Continue Cardizem and metoprolol - BP is 119/71  OSA - Continue nightly CPAP.  Tobacco abuse - Quit in 11/16  Thrombocytosis - Likely reactive due to acute illness   Anxiety - Stable  Morbid obesity due to excess calorie - Continue TNA for now because of acute illness - Counseled on diet  DVT Prophylaxis  - SCD's bilaterally, heparin drip    Code Status: Full.  Family Communication:  plan of care discussed with the patient and her daughter at the bedside  Disposition Plan: Home once cleared by surgery   IV access:  Peripheral IV PICC line   Procedures and diagnostic studies:    Ct Abdomen Pelvis W Contrast 03/01/2015  1. Persistent but definite interval improvement of sigmoid diverticulitis. 2. Drained diverticular abscess.   Dg Abd 2 Views 03/01/2015   Normal small bowel gas pattern. Some colonic gas noted throughout the colon. No significant colonic distension. Drainage catheter is noted in right lower abdomen   Dg Abd 1 View 02/22/2015 Oral contrast seen within colon. Diverticulosis noted. No evidence of bowel obstruction.   Ct Abdomen Pelvis W Contrast 02/25/2015 Perforated sigmoid diverticulitis which has worsened compared with 02/20/2015. There is now small volume pneumoperitoneum. Previously seen small amount of contained extraluminal air adjacent to the sigmoid colon is significantly larger measuring approximately 4.9 x 5.6 cm. 2. Small right pleural effusion. Bibasilar airspace disease likely reflecting atelectasis. These results were called by telephone at the time of interpretation on 02/25/2015 at 1:52  pm to Dr. Barkley Bruns, who verbally acknowledged these results.   Ct Abdomen Pelvis W Contrast 02/20/2015 Sigmoid diverticulitis. There  appears to be 2 sections of the mid sigmoid colon there are inflamed, both projecting in the right lower quadrant. Areas associated extraluminal air reflecting diverticular perforation, but no evidence of an abscess. 2. No other acute findings. 3. Multiple other noninflamed colonic diverticula. 4. Stable small hepatic cysts.  Ct Image Guided Drainage By Percutaneous Catheter 02/26/2015 CT-guided percutaneous catheter drainage performed of a diverticular abscess. A 12 French drain was placed and attached to suction bulb drainage.  Medical Consultants:  Surgery  IAnti-Infectives:   Ertapenem 02/25/15 >>  Cipro 02/20/15 >> 02/22/15 Flagyl 02/20/15 >> 02/22/15 Zosyn 02/22/15 >> 02/25/15   Leisa Lenz, MD  [facility] Pager [phone]  Time spent in minutes: 25 minutes  If 7PM-7AM, please contact night-coverage [email or web address] Password [email or web address] 03/04/2015, 9:20 AM   LOS: 12 days    HPI/Subjective: No acute overnight events. Patient reports pain little better this am.   Objective: Filed Vitals:   03/03/15 1358 03/03/15 2103 03/04/15 0035 03/04/15 0615  BP: 111/64 133/73  119/71  Pulse: 73 90  87  Temp: 98.3 F (36.8 C) 99.3 F (37.4 C)  98.4 F (36.9 C)  TempSrc: Oral Oral  Oral  Resp: 18 20 18 20   Height:      Weight:      SpO2: 94% 98%  97%    Intake/Output Summary (Last 24 hours) at 03/04/15 0920 Last data filed at 03/04/15 0630  Gross per 24 hour  Intake 1352.5 ml  Output     51 ml  Net 1301.5 ml    Exam:   General:  Pt is alert, not in acute distress  Cardiovascular: RRR, S1/S2 appreciated   Respiratory: no wheezing, no crackles, no rhonchi  Abdomen: JP drain in place, tender in lower abd without guarding, appreciate bowel sounds   Extremities: No swelling, palpable pulses   Neuro: Nonfocal  Data Reviewed: Basic Metabolic Panel:  Recent Labs Lab 02/28/15 0610 03/01/15 0705 03/02/15 0455 03/03/15 0528 03/04/15 0427  NA 139 139 138 140 138  K 3.9  4.0 3.7 4.2 4.0  CL 105 105 101 103 101  CO2 22 24 26 26 26   GLUCOSE 92 95 116* 130* 123*  BUN <5* <5* 5* 8 10  CREATININE 0.59 0.61 0.59 0.75 0.62  CALCIUM 8.4* 8.5* 8.7* 9.3 8.9  MG  --   --  2.3 2.2 2.3  PHOS  --   --  5.1* 4.8* 4.8*   Liver Function Tests:  Recent Labs Lab 03/01/15 1110 03/02/15 0455 03/03/15 0528 03/04/15 0427  AST 19 20 21 20   ALT 24 13* 15 13*  ALKPHOS 35* 37* 38 37*  BILITOT 0.6 0.6 0.3 0.6  PROT 6.4* 6.7 7.1 6.9  ALBUMIN 2.7* 2.9* 3.0* 2.9*   No results for input(s): LIPASE, AMYLASE in the last 168 hours. No results for input(s): AMMONIA in the last 168 hours. CBC:  Recent Labs Lab 02/28/15 0610 03/01/15 0705 03/02/15 0455 03/03/15 0528 03/04/15 0427  WBC 7.2 6.5 7.2 8.7 7.1  NEUTROABS  --   --  5.0  --   --   HGB 8.7* 8.9* 9.2* 9.6* 9.3*  HCT 26.6* 27.0* 28.0* 29.8* 29.5*  MCV 81.6 80.6 81.2 81.0 82.2  PLT 414* 468* 523* 599* 637*   Cardiac Enzymes: No results for input(s): CKTOTAL, CKMB, CKMBINDEX, TROPONINI in the last 168 hours. BNP: Invalid input(s): POCBNP  CBG:  Recent Labs Lab 03/03/15 0543 03/03/15 1226 03/03/15 1740 03/03/15 2328 03/04/15 0625  GLUCAP 129* 122* 101* 119* 139*    Results for orders placed or performed during the hospital encounter of 02/20/15  Surgical pcr screen     Status: Abnormal   Collection Time: 02/26/15  5:34 AM  Result Value Ref Range Status   MRSA, PCR NEGATIVE NEGATIVE Final   Staphylococcus aureus POSITIVE (A) NEGATIVE Final  Culture, routine-abscess     Status: None   Collection Time: 02/26/15  3:19 PM  Result Value Ref Range Status   Specimen Description ABSCESS DIVERTICULAR  Final   Special Requests NONE  Final   Gram Stain   Final    ABUNDANT WBC PRESENT,BOTH PMN AND MONONUCLEAR NO SQUAMOUS EPITHELIAL CELLS SEEN NO ORGANISMS SEEN Performed at [facility]    Culture   Final    NO GROWTH 2 DAYS Performed at [facility]    Report Status 03/01/2015 FINAL   Final  Anaerobic culture     Status: None (Preliminary result)   Collection Time: 02/26/15  3:19 PM  Result Value Ref Range Status   Specimen Description ABSCESS DIVERTICULAR  Final   Special Requests NONE  Final   Gram Stain   Final    ABUNDANT WBC PRESENT,BOTH PMN AND MONONUCLEAR NO SQUAMOUS EPITHELIAL CELLS SEEN NO ORGANISMS SEEN Performed at [facility]    Culture   Final    NO ANAEROBES ISOLATED; CULTURE IN PROGRESS FOR 5 DAYS Performed at [facility]    Report Status PENDING  Incomplete     Scheduled Meds: . diltiazem  180 mg Oral Daily  . ertapenem  1 g Intravenous Q24H  . insulin aspart  0-9 Units Subcutaneous TID AC & HS  . metoprolol  50 mg Oral BID  . ranitidine  150 mg Oral BID  . sodium chloride  3 mL Intravenous Q12H  . zolpidem  5 mg Oral QHS   Continuous Infusions: . Marland KitchenTPN (CLINIMIX-E) Adult 83 mL/hr at 03/03/15 1807   And  . fat emulsion 240 mL (03/03/15 1807)  . Marland KitchenTPN (CLINIMIX-E) Adult     And  . fat emulsion    . heparin 1,950 Units/hr (03/04/15 0830)  . 0.9 % sodium chloride with kcl 10 mL/hr at 03/03/15 0330

## 2015-03-04 NOTE — Progress Notes (Signed)
ANTICOAGULATION CONSULT NOTE - Follow Up Consult  Pharmacy Consult for Heparin Indication: bridge therapy for Afib while off Apixaban  Allergies  Allergen Reactions  . Benadryl [Diphenhydramine Hcl] Hives and Itching  . Codeine Nausea And Vomiting  . Dilaudid [Hydromorphone Hcl]     "feels like she dying '  . Percocet [Oxycodone-Acetaminophen] Hives and Itching   Patient Measurements: Height: 5\' 2"  (157.5 cm) Weight: 256 lb (116.121 kg) IBW/kg (Calculated) : 50.1 HEPARIN DW (KG): 78.7  Vital Signs: Temp: 98.4 F (36.9 C) (01/19 0615) Temp Source: Oral (01/19 0615) BP: 119/71 mmHg (01/19 0615) Pulse Rate: 87 (01/19 0615)  Labs:  Recent Labs  03/02/15 0455 03/03/15 0528 03/04/15 0427  HGB 9.2* 9.6* 9.3*  HCT 28.0* 29.8* 29.5*  PLT 523* 599* 637*  HEPARINUNFRC 0.53 0.57 0.69  CREATININE 0.59 0.75 0.62    Estimated Creatinine Clearance: 100.5 mL/min (by C-G formula based on Cr of 0.62).  Medications:  Infusions:  . Marland KitchenTPN (CLINIMIX-E) Adult 83 mL/hr at 03/03/15 1807   And  . fat emulsion 240 mL (03/03/15 1807)  . heparin 2,200 Units/hr (03/03/15 2244)  . 0.9 % sodium chloride with kcl 10 mL/hr at 03/03/15 0330  PTA: apixaban 5mg  BID LD 1/6@ 2100  Assessment: 50 yo F admitted with sigmoid diverticulitis and microperforation currently being treated conservatively with bowel rest and IV antibiotics.  She is on chronic Apixaban for hx Afib which was held on admission.  Pharmacy asked to manage heparin bridge while off apixaban.  Heparin held 1/13 for IR drain placement, Heparin resumption delayed by loss of IV access.   PICC placed 1/14  03/04/2015:  Heparin level continues to be therapeutic on current rate of 2200 units/hr but has been trending up steadily and is now at the upper limit of the target range.  CBC stable  No reported bleeding except slight vaginal bleeding overnight 1/13.  Holding off on resuming apixaban until PO intake improves. Started CL diet  1/18.   Goal of Therapy:  Heparin level 0.3-0.7 units/ml Monitor platelets by anticoagulation protocol: Yes   Plan:   Reduce Heparin slightly to 1950units/hr  Recheck heparin level in 6 hours.  Daily Heparin level, CBC  Per notes, continuing conservative approach per [facility]  Romeo Rabon, PharmD, pager [phone]. 03/04/2015,7:39 AM.

## 2015-03-04 NOTE — Progress Notes (Signed)
PARENTERAL NUTRITION CONSULT NOTE - follow up  Pharmacy Consult for TPN Indication: prolonged malnutrition, possible bowel obstruction  Allergies  Allergen Reactions  . Benadryl [Diphenhydramine Hcl] Hives and Itching  . Codeine Nausea And Vomiting  . Dilaudid [Hydromorphone Hcl]     "feels like she dying '  . Percocet [Oxycodone-Acetaminophen] Hives and Itching    Patient Measurements: Height: 5\' 2"  (157.5 cm) Weight: 256 lb (116.121 kg) IBW/kg (Calculated) : 50.1 Adjusted Body Weight: 67 kg  Vital Signs: Temp: 98.4 F (36.9 C) (01/19 0615) Temp Source: Oral (01/19 0615) BP: 119/71 mmHg (01/19 0615) Pulse Rate: 87 (01/19 0615) Intake/Output from previous day: 01/18 0701 - 01/19 0700 In: 1352.5 [I.V.:253; TPN:1069.5] Out: 51 [Drains:51] Intake/Output from this shift:    Labs:  Recent Labs  03/02/15 0455 03/03/15 0528 03/04/15 0427  WBC 7.2 8.7 7.1  HGB 9.2* 9.6* 9.3*  HCT 28.0* 29.8* 29.5*  PLT 523* 599* 637*     Recent Labs  03/01/15 1110 03/02/15 0453 03/02/15 0455 03/03/15 0528 03/04/15 0427  NA  --   --  138 140 138  K  --   --  3.7 4.2 4.0  CL  --   --  101 103 101  CO2  --   --  26 26 26   GLUCOSE  --   --  116* 130* 123*  BUN  --   --  5* 8 10  CREATININE  --   --  0.59 0.75 0.62  CALCIUM  --   --  8.7* 9.3 8.9  MG  --   --  2.3 2.2 2.3  PHOS  --   --  5.1* 4.8* 4.8*  PROT 6.4*  --  6.7 7.1 6.9  ALBUMIN 2.7*  --  2.9* 3.0* 2.9*  AST 19  --  20 21 20   ALT 24  --  13* 15 13*  ALKPHOS 35*  --  37* 38 37*  BILITOT 0.6  --  0.6 0.3 0.6  BILIDIR <0.1*  --   --   --   --   IBILI NOT CALCULATED  --   --   --   --   PREALBUMIN 5.9* 7.2*  --   --   --   TRIG  --   --  158*  --   --    Estimated Creatinine Clearance: 100.5 mL/min (by C-G formula based on Cr of 0.62).    Recent Labs  03/03/15 1740 03/03/15 2328 03/04/15 0625  GLUCAP 101* 119* 139*    Medical History: History reviewed. No pertinent past medical history.  Medications:   Scheduled:  . diltiazem  180 mg Oral Daily  . ertapenem  1 g Intravenous Q24H  . insulin aspart  0-9 Units Subcutaneous 4 times per day  . metoprolol  50 mg Oral BID  . ranitidine  150 mg Oral BID  . sodium chloride  3 mL Intravenous Q12H  . zolpidem  5 mg Oral QHS   Infusions:  . Marland KitchenTPN (CLINIMIX-E) Adult 83 mL/hr at 03/03/15 1807   And  . fat emulsion 240 mL (03/03/15 1807)  . heparin 2,200 Units/hr (03/03/15 2244)  . 0.9 % sodium chloride with kcl 10 mL/hr at 03/03/15 0330    Insulin Requirements: 2 unit SSI  Current Nutrition: CL  IVF: NS with 29mEq KCL at 10 ml/hr  Central access: PICC TPN start date: 1/16  ASSESSMENT  HPI: Patient known to pharmacy for dosing of IV heparin. Per [facility], patient not improving since drain placement last week and has not eaten since 1/7. Plan per [facility] to check for obstruction and start TPN.  Significant events:  1/17: plan per [facility] to continue conservative approach for now with no surgery, CT improving  Today, 03/04/2015  Glucose - at goal - <150  Electrolytes - stable,  Phos still slightly high but stable - cont to monitor daily.  Renal - SCr stable  LFTs - stable  TGs - 158 (1/17)  Prealbumin - 5.8 (1/12), 7.2 (1/17)  NUTRITIONAL GOALS                                                                                             RD recs: Goals/day: Kcal 1750-1950, protein 85-95g, >= 2L  Clinimix E 5/15 at a goal rate of 22ml/hr + 20% fat emulsion at 57ml/hr to provide: 100g/day protein, 1919Kcal/day. Note used adjusted body weight for calculations due to obesity  PLAN                                                                                                                         At 1800 today:  Cont Clinimix E 5/15 to 72ml/hr.  20% fat emulsion at 42ml/hr.  TPN to contain standard multivitamins and trace  elements.  Continue IVF.  Continue sensitive SSI, change schedule to [facility] & HS.  F/u tolerance of enteral diet. I encouraged her to try.  TPN lab panels on Mondays & Thursdays.  Monitor phos closely - cont phos daily.   Romeo Rabon, PharmD, pager [phone]. 03/04/2015,8:25 AM.

## 2015-03-05 ENCOUNTER — Encounter (HOSPITAL_COMMUNITY): Payer: Self-pay | Admitting: *Deleted

## 2015-03-05 LAB — BASIC METABOLIC PANEL
ANION GAP: 10 (ref 5–15)
BUN: 10 mg/dL (ref 6–20)
CALCIUM: 9.1 mg/dL (ref 8.9–10.3)
CHLORIDE: 102 mmol/L (ref 101–111)
CO2: 25 mmol/L (ref 22–32)
Creatinine, Ser: 0.59 mg/dL (ref 0.44–1.00)
GFR calc non Af Amer: >60 mL/min (ref >60)
GLUCOSE: 157 mg/dL — AB (ref 65–99)
POTASSIUM: 4.3 mmol/L (ref 3.5–5.1)
Sodium: 137 mmol/L (ref 135–145)

## 2015-03-05 LAB — URINE CULTURE

## 2015-03-05 LAB — PHOSPHORUS: Phosphorus: 4.7 mg/dL — ABNORMAL HIGH (ref 2.5–4.6)

## 2015-03-05 LAB — HEPARIN LEVEL (UNFRACTIONATED): Heparin Unfractionated: 0.48 IU/mL (ref 0.30–0.70)

## 2015-03-05 LAB — GLUCOSE, CAPILLARY: GLUCOSE-CAPILLARY: 156 mg/dL — AB (ref 65–99)

## 2015-03-05 MED ORDER — FAT EMULSION 20 % IV EMUL
240.0000 mL | INTRAVENOUS | Status: DC
  Filled 2015-03-05: qty 250

## 2015-03-05 MED ORDER — M.V.I. ADULT IV INJ
INJECTION | INTRAVENOUS | Status: DC
  Filled 2015-03-05: qty 1992

## 2015-03-05 MED ORDER — POTASSIUM CHLORIDE IN NACL 20-0.9 MEQ/L-% IV SOLN
INTRAVENOUS | Status: DC
  Administered 2015-03-05: 10:00:00 via INTRAVENOUS
  Filled 2015-03-05: qty 1000

## 2015-03-05 MED ORDER — APIXABAN 5 MG PO TABS
5.0000 mg | ORAL_TABLET | Freq: Two times a day (BID) | ORAL | Status: DC
  Administered 2015-03-05 – 2015-03-06 (×3): 5 mg via ORAL
  Filled 2015-03-05 (×4): qty 1

## 2015-03-05 NOTE — Progress Notes (Signed)
PARENTERAL NUTRITION CONSULT NOTE - follow up  Pharmacy Consult for TPN Indication: prolonged malnutrition, possible bowel obstruction  Allergies  Allergen Reactions  . Benadryl [Diphenhydramine Hcl] Hives and Itching  . Codeine Nausea And Vomiting  . Dilaudid [Hydromorphone Hcl]     "feels like she dying '  . Percocet [Oxycodone-Acetaminophen] Hives and Itching    Patient Measurements: Height: 5\' 2"  (157.5 cm) Weight: 256 lb (116.121 kg) IBW/kg (Calculated) : 50.1 Adjusted Body Weight: 67 kg  Vital Signs: Temp: 98.6 F (37 C) (01/20 0645) Temp Source: Oral (01/20 0645) BP: 130/80 mmHg (01/20 0645) Pulse Rate: 99 (01/20 0645) Intake/Output from previous day: 01/19 0701 - 01/20 0700 In: 3831.3 [P.O.:960; I.V.:474.3; IV Piggyback:200; TPN:2187.1] Out: -  Intake/Output from this shift:    Labs:  Recent Labs  03/03/15 0528 03/04/15 0427  WBC 8.7 7.1  HGB 9.6* 9.3*  HCT 29.8* 29.5*  PLT 599* 637*     Recent Labs  03/03/15 0528 03/04/15 0427 03/05/15 0430  NA 140 138 137  K 4.2 4.0 4.3  CL 103 101 102  CO2 26 26 25   GLUCOSE 130* 123* 157*  BUN 8 10 10   CREATININE 0.75 0.62 0.59  CALCIUM 9.3 8.9 9.1  MG 2.2 2.3  --   PHOS 4.8* 4.8* 4.7*  PROT 7.1 6.9  --   ALBUMIN 3.0* 2.9*  --   AST 21 20  --   ALT 15 13*  --   ALKPHOS 38 37*  --   BILITOT 0.3 0.6  --   Corr Ca 10 [Ca]x[Phos] = 47 Estimated Creatinine Clearance: 100.5 mL/min (by C-G formula based on Cr of 0.59).    Recent Labs  03/04/15 1719 03/04/15 2243 03/05/15 0758  GLUCAP 144* 137* 156*    Medical History: Past Medical History  Diagnosis Date  . Diverticulitis 2015  . Atrial fibrillation ([place]) 2015  . Hypertension   . Dysrhythmia   . Anxiety   . Sleep apnea   . Headache     Medications:  Scheduled:  . clotrimazole  1 Applicatorful Vaginal QHS  . diltiazem  180 mg Oral Daily  . ertapenem  1 g Intravenous Q24H  . insulin aspart  0-9 Units Subcutaneous TID AC & HS  .  metoprolol  50 mg Oral BID  . ranitidine  150 mg Oral BID  . sodium chloride  3 mL Intravenous Q12H  . zolpidem  5 mg Oral QHS   Infusions:  . Marland KitchenTPN (CLINIMIX-E) Adult 83 mL/hr at 03/04/15 1753   And  . fat emulsion 240 mL (03/04/15 1753)  . Marland KitchenTPN (CLINIMIX-E) Adult     And  . fat emulsion    . heparin 1,950 Units/hr (03/04/15 2245)  . 0.9 % sodium chloride with kcl 10 mL/hr at 03/03/15 0330    Insulin Requirements: 4 unit SSI in past 24 hrs  Current Nutrition:  FL Clinimix-E 5/15 at 83 mL/hr Fat Emulsion 20% at 10 mL/hr  IVF: NS with 23mEq KCL at 10 ml/hr  Central access: PICC TPN start date: 1/16  ASSESSMENT  HPI: Patient known to pharmacy for dosing of IV heparin. Per [facility], patient not improving since drain placement last week and has not eaten since 1/7. Plan per [facility] to check for obstruction and start TPN.  Significant events:  1/17: plan per [facility] to continue conservative approach for now with no surgery, CT improving   Today, 03/05/2015  Glucose - acceptable (range 125-157) on SSI  Electrolytes - Phos remains slightly elevated but stable, and [Ca]x[Phos] product < 55.  Unable to adjust phosphorus coent of Clinimix without converting to an electrolyte-free formula  Renal - SCr stable  LFTs - stable  TGs - 158 (1/17)  Prealbumin - 5.8 (1/12), 7.2 (1/17)  Orders received from [facility] to advance to soft diet and wean off TPN; anticipate discharge tomorrow.  NUTRITIONAL GOALS                                                                                             RD recs: Goals/day: Kcal 1750-1950, protein 85-95g, >= 2L  Clinimix E 5/15 at a goal rate of 12ml/hr + 20% fat emulsion at 38ml/hr to provide: 100g/day protein, 1919Kcal/day. Note used adjusted body weight for calculations due to obesity  PLAN                                                                                                                          Reduce Clinimix to 40 mL/hr and Fat Emulsion to 5 mL/hr now. Discontinue TPN tonight at [place], PharmD, BCPS Pager: [phone] 03/05/2015  10:14 AM

## 2015-03-05 NOTE — Progress Notes (Signed)
Patient ID: Crystal Brennan, female   DOB: 12-Dec-1965, 50 y.o.   MRN: [id number]     [facility]      [address] [place], [place] [address]    Phone: [phone] FAX: [phone]     Subjective: Overall feeling better. Afebrile.   Ambulating the halls.    Objective:  Vital signs:  Filed Vitals:   03/04/15 1500 03/04/15 2200 03/04/15 2321 03/05/15 0645  BP: 101/55 107/61  130/80  Pulse: 81 86  99  Temp: 98 F (36.7 C) 98.1 F (36.7 C)  98.6 F (37 C)  TempSrc: Oral Oral  Oral  Resp: _0 Height:      Weight:      SpO2: 97% 97%  100%    Last BM Date: 03/05/15  Intake/Output   Yesterday:  01/19 0701 - 01/20 0700 In: 3831.3 [P.O.:960; I.V.:474.3; IV Piggyback:200; TPN:2187.1] Out: -  This shift:  Total I/O In: 240 [P.O.:240] Out: 1 [Urine:1]  Physical Exam: General: Pt awake/alert/oriented x4 in no acute distress  Abdomen: Soft.  Nondistended.  Mild ttp rlq. No evidence of peritonitis.  No incarcerated hernias.    Problem List:   Principal Problem:   Diverticulitis of large intestine with perforation without bleeding Active Problems:   Anxiety   PAF (paroxysmal atrial fibrillation) (HCC)   OSA (obstructive sleep apnea)   Hypokalemia   Chronic diastolic congestive heart failure, NYHA class 1 (HCC)   Anemia of chronic disease   Thrombocytosis (HCC)   Benign essential HTN   Morbid obesity due to excess calories ([place])   Pelvic abscess in female    Results:   Labs: Results for orders placed or performed during the hospital encounter of 02/20/15 (from the past 48 hour(s))  Glucose, capillary     Status: Abnormal   Collection Time: 03/03/15 12:26 PM  Result Value Ref Range   Glucose-Capillary 122 (H) 65 - 99 mg/dL  Urinalysis, Routine w reflex microscopic (not at [facility])     Status: Abnormal   Collection Time: 03/03/15  3:42 PM  Result Value Ref Range   Color, Urine YELLOW YELLOW   APPearance  CLEAR CLEAR   Specific Gravity, Urine 1.008 1.005 - 1.030   pH 6.5 5.0 - 8.0   Glucose, UA NEGATIVE NEGATIVE mg/dL   Hgb urine dipstick TRACE (A) NEGATIVE   Bilirubin Urine NEGATIVE NEGATIVE   Ketones, ur NEGATIVE NEGATIVE mg/dL   Protein, ur NEGATIVE NEGATIVE mg/dL   Nitrite NEGATIVE NEGATIVE   Leukocytes, UA MODERATE (A) NEGATIVE  Culture, Urine     Status: None (Preliminary result)   Collection Time: 03/03/15  3:42 PM  Result Value Ref Range   Specimen Description URINE, CLEAN CATCH    Special Requests NONE    Culture      TOO YOUNG TO READ Performed at [facility]    Report Status PENDING   Urine microscopic-add on     Status: Abnormal   Collection Time: 03/03/15  3:42 PM  Result Value Ref Range   Squamous Epithelial / LPF 6-30 (A) NONE SEEN   WBC, UA 6-30 0 - 5 WBC/hpf   RBC / HPF 0-5 0 - 5 RBC/hpf   Bacteria, UA RARE (A) NONE SEEN  Glucose, capillary     Status: Abnormal   Collection Time: 03/03/15  5:40 PM  Result Value Ref Range   Glucose-Capillary 101 (H) 65 - 99 mg/dL  Glucose, capillary  Status: Abnormal   Collection Time: 03/03/15 11:28 PM  Result Value Ref Range   Glucose-Capillary 119 (H) 65 - 99 mg/dL  CBC     Status: Abnormal   Collection Time: 03/04/15  4:27 AM  Result Value Ref Range   WBC 7.1 4.0 - 10.5 K/uL   RBC 3.59 (L) 3.87 - 5.11 MIL/uL   Hemoglobin 9.3 (L) 12.0 - 15.0 g/dL   HCT 29.5 (L) 36.0 - 46.0 %   MCV 82.2 78.0 - 100.0 fL   MCH 25.9 (L) 26.0 - 34.0 pg   MCHC 31.5 30.0 - 36.0 g/dL   RDW 15.5 11.5 - 15.5 %   Platelets 637 (H) 150 - 400 K/uL  Heparin level (unfractionated)     Status: None   Collection Time: 03/04/15  4:27 AM  Result Value Ref Range   Heparin Unfractionated 0.69 0.30 - 0.70 IU/mL    Comment:        IF HEPARIN RESULTS ARE BELOW EXPECTED VALUES, AND PATIENT DOSAGE HAS BEEN CONFIRMED, SUGGEST FOLLOW UP TESTING OF ANTITHROMBIN III LEVELS.   Comprehensive metabolic panel     Status: Abnormal   Collection  Time: 03/04/15  4:27 AM  Result Value Ref Range   Sodium 138 135 - 145 mmol/L   Potassium 4.0 3.5 - 5.1 mmol/L   Chloride 101 101 - 111 mmol/L   CO2 26 22 - 32 mmol/L   Glucose, Bld 123 (H) 65 - 99 mg/dL   BUN 10 6 - 20 mg/dL   Creatinine, Ser 0.62 0.44 - 1.00 mg/dL   Calcium 8.9 8.9 - 10.3 mg/dL   Total Protein 6.9 6.5 - 8.1 g/dL   Albumin 2.9 (L) 3.5 - 5.0 g/dL   AST 20 15 - 41 U/L   ALT 13 (L) 14 - 54 U/L   Alkaline Phosphatase 37 (L) 38 - 126 U/L   Total Bilirubin 0.6 0.3 - 1.2 mg/dL   GFR calc non Af Amer >60 >60 mL/min   GFR calc Af Amer >60 >60 mL/min    Comment: (NOTE) The eGFR has been calculated using the CKD EPI equation. This calculation has not been validated in all clinical situations. eGFR's persistently <60 mL/min signify possible Chronic Kidney Disease.    Anion gap 11 5 - 15  Magnesium     Status: None   Collection Time: 03/04/15  4:27 AM  Result Value Ref Range   Magnesium 2.3 1.7 - 2.4 mg/dL  Phosphorus     Status: Abnormal   Collection Time: 03/04/15  4:27 AM  Result Value Ref Range   Phosphorus 4.8 (H) 2.5 - 4.6 mg/dL  Glucose, capillary     Status: Abnormal   Collection Time: 03/04/15  6:25 AM  Result Value Ref Range   Glucose-Capillary 139 (H) 65 - 99 mg/dL  Glucose, capillary     Status: Abnormal   Collection Time: 03/04/15 12:19 PM  Result Value Ref Range   Glucose-Capillary 125 (H) 65 - 99 mg/dL  Heparin level (unfractionated)     Status: None   Collection Time: 03/04/15  2:15 PM  Result Value Ref Range   Heparin Unfractionated 0.54 0.30 - 0.70 IU/mL    Comment:        IF HEPARIN RESULTS ARE BELOW EXPECTED VALUES, AND PATIENT DOSAGE HAS BEEN CONFIRMED, SUGGEST FOLLOW UP TESTING OF ANTITHROMBIN III LEVELS.   Glucose, capillary     Status: Abnormal   Collection Time: 03/04/15  5:19 PM  Result Value Ref Range  Glucose-Capillary 144 (H) 65 - 99 mg/dL  Heparin level (unfractionated)     Status: None   Collection Time: 03/04/15 10:00 PM   Result Value Ref Range   Heparin Unfractionated 0.44 0.30 - 0.70 IU/mL    Comment:        IF HEPARIN RESULTS ARE BELOW EXPECTED VALUES, AND PATIENT DOSAGE HAS BEEN CONFIRMED, SUGGEST FOLLOW UP TESTING OF ANTITHROMBIN III LEVELS.   Glucose, capillary     Status: Abnormal   Collection Time: 03/04/15 10:43 PM  Result Value Ref Range   Glucose-Capillary 137 (H) 65 - 99 mg/dL  Heparin level (unfractionated)     Status: None   Collection Time: 03/05/15  4:30 AM  Result Value Ref Range   Heparin Unfractionated 0.48 0.30 - 0.70 IU/mL    Comment:        IF HEPARIN RESULTS ARE BELOW EXPECTED VALUES, AND PATIENT DOSAGE HAS BEEN CONFIRMED, SUGGEST FOLLOW UP TESTING OF ANTITHROMBIN III LEVELS.   Basic metabolic panel     Status: Abnormal   Collection Time: 03/05/15  4:30 AM  Result Value Ref Range   Sodium 137 135 - 145 mmol/L   Potassium 4.3 3.5 - 5.1 mmol/L   Chloride 102 101 - 111 mmol/L   CO2 25 22 - 32 mmol/L   Glucose, Bld 157 (H) 65 - 99 mg/dL   BUN 10 6 - 20 mg/dL   Creatinine, Ser 0.59 0.44 - 1.00 mg/dL   Calcium 9.1 8.9 - 10.3 mg/dL   GFR calc non Af Amer >60 >60 mL/min   GFR calc Af Amer >60 >60 mL/min    Comment: (NOTE) The eGFR has been calculated using the CKD EPI equation. This calculation has not been validated in all clinical situations. eGFR's persistently <60 mL/min signify possible Chronic Kidney Disease.    Anion gap 10 5 - 15  Phosphorus     Status: Abnormal   Collection Time: 03/05/15  4:30 AM  Result Value Ref Range   Phosphorus 4.7 (H) 2.5 - 4.6 mg/dL  Glucose, capillary     Status: Abnormal   Collection Time: 03/05/15  7:58 AM  Result Value Ref Range   Glucose-Capillary 156 (H) 65 - 99 mg/dL    Imaging / Studies: Ir Sinus/fist Tube Chk-non Gi  03/04/2015  CLINICAL DATA:  50 year old female with a history of diverticular abscess which was treated with percutaneous drain placement on 02/26/2015. Follow-up CT scan on a 116 shows no significant  residual abscess. Drain output has significantly decreased in the patient is clinically improving. She presents for drain injection prior to possible removal. EXAM: SINUS TRACT INJECTION/FISTULOGRAM Date: 03/04/2015 PROCEDURE: 1. Contrast injection through existing drainage catheter. Interventional Radiologist:  Criselda Peaches, MD ANESTHESIA/SEDATION: None MEDICATIONS: None FLUOROSCOPY TIME:  36 seconds for a total of 44 mGy CONTRAST:  5 mL Omnipaque 300 TECHNIQUE: Informed consent was obtained from the patient following explanation of the procedure, risks, benefits and alternatives. The patient understands, agrees and consents for the procedure. All questions were addressed. A time out was performed. Under real-time fluoroscopic evaluation, the existing percutaneous drain was injected with contrast material. Contrast fills a collapsed abscess cavity. The injected contrast material was evaluated in bilateral obliquities and intermittently imaged over time. There is no evidence of filling of the adjacent colon. The contrast material is stagnant and un changing. The decision was made to remove the drain. The retention suture was cut and removed. The drain was cut and pulled. A bandage was applied. COMPLICATIONS:  None IMPRESSION: 1. Negative for fistulous connection with the colon. 2. The drain was removed. Electronically Signed   By: Jacqulynn Cadet M.D.   On: 03/04/2015 16:35    Medications / Allergies:  Scheduled Meds: . clotrimazole  1 Applicatorful Vaginal QHS  . diltiazem  180 mg Oral Daily  . ertapenem  1 g Intravenous Q24H  . insulin aspart  0-9 Units Subcutaneous TID AC & HS  . metoprolol  50 mg Oral BID  . ranitidine  150 mg Oral BID  . sodium chloride  3 mL Intravenous Q12H  . zolpidem  5 mg Oral QHS   Continuous Infusions: . 0.9 % NaCl with KCl 20 mEq / L    . Marland KitchenTPN (CLINIMIX-E) Adult 83 mL/hr at 03/04/15 1753   And  . fat emulsion 240 mL (03/04/15 1753)  . Marland KitchenTPN (CLINIMIX-E) Adult      And  . fat emulsion    . heparin 1,950 Units/hr (03/04/15 2245)   PRN Meds:.acetaminophen **OR** acetaminophen, HYDROcodone-acetaminophen, LORazepam, morphine injection, ondansetron **OR** ondansetron (ZOFRAN) IV, phenol, simethicone, sodium chloride, sodium chloride  Antibiotics: Anti-infectives    Start     Dose/Rate Route Frequency Ordered Stop   02/25/15 1600  ertapenem (INVANZ) 1 g in sodium chloride 0.9 % 50 mL IVPB     1 g 100 mL/hr over 30 Minutes Intravenous Every 24 hours 02/25/15 1444     02/22/15 1400  piperacillin-tazobactam (ZOSYN) IVPB 3.375 g  Status:  Discontinued     3.375 g 12.5 mL/hr over 240 Minutes Intravenous 3 times per day 02/22/15 1220 02/25/15 1444   02/22/15 1200  ciprofloxacin (CIPRO) tablet 500 mg  Status:  Discontinued     500 mg Oral 2 times daily 02/22/15 1108 02/22/15 1220   02/21/15 1000  ciprofloxacin (CIPRO) IVPB 400 mg  Status:  Discontinued     400 mg 200 mL/hr over 60 Minutes Intravenous Every 12 hours 02/20/15 2343 02/22/15 1108   02/21/15 0600  metroNIDAZOLE (FLAGYL) IVPB 500 mg  Status:  Discontinued     500 mg 100 mL/hr over 60 Minutes Intravenous Every 8 hours 02/20/15 2343 02/22/15 1220   02/20/15 2200  ciprofloxacin (CIPRO) IVPB 400 mg  Status:  Discontinued     400 mg 200 mL/hr over 60 Minutes Intravenous  Once 02/20/15 2152 02/22/15 1108   02/20/15 2200  metroNIDAZOLE (FLAGYL) IVPB 500 mg     500 mg 100 mL/hr over 60 Minutes Intravenous  Once 02/20/15 2152 02/21/15 0125       Assessment/Plan Recurrent diverticulitis with abscess -drain removed, resolving on imaging and physical exam.  Advance to soft diet. CV-AFib, Eliquis on hold . CHF and HTN PCM-wean off TPN  FEN-soft diet ID-Day 3 Flagyl, day 2 Cipro completed, 4 days of Zosyn, Invanz D#8 VTE prophylaxis-SCD, may resume Eliquis and stop heparin, will left pharmacy know.  Dispo-anticipate next 24h  Erby Pian, [facility] Pager  [phone](7A-4:30P)   03/05/2015 10:01 AM

## 2015-03-05 NOTE — Progress Notes (Signed)
PARENTERAL NUTRITION CONSULT NOTE - follow up  Pharmacy Consult for TPN Indication: prolonged malnutrition, possible bowel obstruction  Allergies  Allergen Reactions  . Benadryl [Diphenhydramine Hcl] Hives and Itching  . Codeine Nausea And Vomiting  . Dilaudid [Hydromorphone Hcl]     "feels like she dying '  . Percocet [Oxycodone-Acetaminophen] Hives and Itching    Patient Measurements: Height: 5\' 2"  (157.5 cm) Weight: 256 lb (116.121 kg) IBW/kg (Calculated) : 50.1 Adjusted Body Weight: 67 kg  Vital Signs: Temp: 98.6 F (37 C) (01/20 0645) Temp Source: Oral (01/20 0645) BP: 130/80 mmHg (01/20 0645) Pulse Rate: 99 (01/20 0645) Intake/Output from previous day: 01/19 0701 - 01/20 0700 In: 3831.3 [P.O.:960; I.V.:474.3; IV Piggyback:200; TPN:2187.1] Out: -  Intake/Output from this shift:    Labs:  Recent Labs  03/03/15 0528 03/04/15 0427  WBC 8.7 7.1  HGB 9.6* 9.3*  HCT 29.8* 29.5*  PLT 599* 637*     Recent Labs  03/03/15 0528 03/04/15 0427 03/05/15 0430  NA 140 138 137  K 4.2 4.0 4.3  CL 103 101 102  CO2 26 26 25   GLUCOSE 130* 123* 157*  BUN 8 10 10   CREATININE 0.75 0.62 0.59  CALCIUM 9.3 8.9 9.1  MG 2.2 2.3  --   PHOS 4.8* 4.8* 4.7*  PROT 7.1 6.9  --   ALBUMIN 3.0* 2.9*  --   AST 21 20  --   ALT 15 13*  --   ALKPHOS 38 37*  --   BILITOT 0.3 0.6  --    Estimated Creatinine Clearance: 100.5 mL/min (by C-G formula based on Cr of 0.59).    Recent Labs  03/04/15 1719 03/04/15 2243 03/05/15 0758  GLUCAP 144* 137* 156*    Medical History: Past Medical History  Diagnosis Date  . Diverticulitis 2015  . Atrial fibrillation ([place]) 2015  . Hypertension   . Dysrhythmia   . Anxiety   . Sleep apnea   . Headache     Medications:  Scheduled:  . clotrimazole  1 Applicatorful Vaginal QHS  . diltiazem  180 mg Oral Daily  . ertapenem  1 g Intravenous Q24H  . insulin aspart  0-9 Units Subcutaneous TID AC & HS  . metoprolol  50 mg Oral BID  .  ranitidine  150 mg Oral BID  . sodium chloride  3 mL Intravenous Q12H  . zolpidem  5 mg Oral QHS   Infusions:  . Marland KitchenTPN (CLINIMIX-E) Adult 83 mL/hr at 03/04/15 1753   And  . fat emulsion 240 mL (03/04/15 1753)  . heparin 1,950 Units/hr (03/04/15 2245)  . 0.9 % sodium chloride with kcl 10 mL/hr at 03/03/15 0330    Insulin Requirements: 2 unit SSI  Current Nutrition: CL  IVF: NS with 10mEq KCL at 10 ml/hr  Central access: PICC TPN start date: 1/16  ASSESSMENT                                                                                                          HPI: Patient known to pharmacy  for dosing of IV heparin. Per CCS, patient not improving since drain placement last week and has not eaten since 1/7. Plan per CCS to check for obstruction and start TPN.  Significant events:  1/17: plan per CCS to continue conservative approach for now with no surgery, CT improving  Today, 03/05/2015  Glucose - at goal - <150  Electrolytes - stable,  Phos still slightly high but stable - cont to monitor daily.  Renal - SCr stable  LFTs - stable  TGs - 158 (1/17)  Prealbumin - 5.8 (1/12), 7.2 (1/17)  NUTRITIONAL GOALS                                                                                             RD recs: Goals/day: Kcal 1750-1950, protein 85-95g, >= 2L  Clinimix E 5/15 at a goal rate of 40ml/hr + 20% fat emulsion at 11ml/hr to provide: 100g/day protein, 1919Kcal/day. Note used adjusted body weight for calculations due to obesity  PLAN                                                                                                                         At 1800 today:  Cont Clinimix E 5/15 at 17ml/hr.  Cont 20% fat emulsion at 45ml/hr.  TPN to contain standard multivitamins and trace elements.  Continue IVF.  Continue sensitive SSI   F/u tolerance of enteral diet  TPN lab panels on Mondays & Thursdays - follow phos

## 2015-03-05 NOTE — Progress Notes (Signed)
Nutrition Follow-up  DOCUMENTATION CODES:   Morbid obesity  INTERVENTION:  - TPN wean/discontinued per pharmacy - RD will continue to monitor for needs consistent with diet advancement  NUTRITION DIAGNOSIS:   Inadequate oral intake related to inability to eat as evidenced by other (see comment) (cramping and nausea with intakes, not eating much if at all since 02/20/15). -improving with diet advancement  GOAL:   Patient will meet greater than or equal to 90% of their needs -met  MONITOR:   PO intake, Weight trends, Labs, Skin, I & O's, Other (Comment) (TPN regimen)   ASSESSMENT:   50 yo patient with history of A. fib on Eliquis, HTN, OSA on nightly CPAP and recurrent diverticulitis (3 prior episodes) presented to the [facility] ED on 02/20/15 with complaints of 2 day history of abdominal pain similar to prior episodes of diverticulitis. No nausea or vomiting reported. Possible constipation-last p.m. 02/20/15. CT abdomen in ED confirmed sigmoid diverticulitis and microperforation. Treating conservatively. Surgeons following.  1/20 Pt now advanced to Soft diet following breakfast and she consumed 75% of lunch on this diet. Pt currently walking the hallways but did visualize lunch tray in her room. Plan for TPN to be d/c tonight after weaning to 40 mL/hr during the day.  Per pharmacy note this AM at 0853: Reduce Clinimix to 40 mL/hr and Fat Emulsion to 5 mL/hr now. Discontinue TPN tonight at 6pm.   Pt meeting needs with weaning TPN and PO intakes today but will monitor once TPN off. Medications reviewed. Labs reviewed; CBGs: 101-156 mg/dL, Phos: 4.7 mg/dL.   1/18 - Pt is currently receiving Clinimix E 5/15 @ 65 mL/hr with 20% lipids @ 10 mL/hr which is providing 1502 kcal, 72 grams of protein and is not meeting needs.  - Per pharmacy note, goal TPN: Clinimix E 5/15 @ 83 mL/hr (1992 mL/day) with 20% lipids @ 10 mL/hr which will provide 1894 kcal, 100 grams of protein which  will meet estimated needs.  - Surgery note from today states that pt had stated wanting to drink clear liquids and that pt is having some bowel movements; surgery recommending to allow CLD and order placed already.  - Per pharmacy note this AM at 0955: At 1800 today:  Increase Clinimix E 5/15 to 87m/hr.  20% fat emulsion at 144mhr.  TPN to contain standard multivitamins and trace elements.  [facility] Continue CBG/SSI q6.   TPN lab panels on Mondays & Thursdays.  Monitor phos closely - if continues to rise will need to remove lytes from TPN  1/16 - Pt very drowsy at time of RD visit and the majority of information was provided by her daughter, who is at bedside.  - Pt attempted to consume chicken broth yesterday but had severe cramping and was unable to consume more than a few spoonfuls.  - PTA pt had been trying to eat a more healthy diet and was also avoiding foods that she had previously been advised may lead to diverticulitis (as she has a hx of this). - Despite these changes pt was experiencing severe cramping and associated nausea and vomiting with intakes for "a long time."  - Family denies pt having any emesis since admission but she has been complaining of ongoing cramping/abdominal pain even without intakes.  - Per chart review, pt has gained 7 lbs in the past 5 months.  - No muscle or fat wasting noted during physical assessment. - Per pharmacy note today at 1012:  At 1800 today:  Start Clinimix E 5/15 at 25m/hr.  20% fat emulsion at 138mhr.  Plan to advance as tolerated to the goal rate.  TPN to contain standard multivitamins and trace elements.  Reduce IVF to 5072mr.  Add CBG/SSI q6.   Goal for TPN: Clinimix E 5/15 @ 83 mL/hr with 20% lipids @ 10 mL/hr which will provide 1919 kcal and 100 grams of protein which will meet 100% estimated needs  Diet Order:  TPN (CLINIMIX-E) Adult DIET SOFT Room service appropriate?: Yes; Fluid  consistency:: Thin  Skin:  Reviewed, no issues  Last BM:  1/20  Height:   Ht Readings from Last 1 Encounters:  02/20/15 5' 2"  (1.575 m)    Weight:   Wt Readings from Last 1 Encounters:  02/20/15 256 lb (116.121 kg)    Ideal Body Weight:  50 kg (kg)  BMI:  Body mass index is 46.81 kg/(m^2).  Estimated Nutritional Needs:   Kcal:  [id number]:  85-95 grams  Fluid:  >/= 2 L/day  EDUCATION NEEDS:   No education needs identified at this time     JesJarome MatinD, LDN Inpatient Clinical Dietitian Pager # [phone] hours/weekend pager # [phone]

## 2015-03-05 NOTE — Progress Notes (Signed)
ANTICOAGULATION CONSULT NOTE - Follow Up Consult  Pharmacy Consult for Heparin Indication: bridge therapy for Afib while off Apixaban  Allergies  Allergen Reactions  . Benadryl [Diphenhydramine Hcl] Hives and Itching  . Codeine Nausea And Vomiting  . Dilaudid [Hydromorphone Hcl]     "feels like she dying '  . Percocet [Oxycodone-Acetaminophen] Hives and Itching   Patient Measurements: Height: 5\' 2"  (157.5 cm) Weight: 256 lb (116.121 kg) IBW/kg (Calculated) : 50.1 HEPARIN DW (KG): 78.7  Vital Signs: Temp: 98.6 F (37 C) (01/20 0645) Temp Source: Oral (01/20 0645) BP: 130/80 mmHg (01/20 0645) Pulse Rate: 99 (01/20 0645)  Labs:  Recent Labs  03/03/15 0528 03/04/15 0427 03/04/15 1415 03/04/15 2200 03/05/15 0430  HGB 9.6* 9.3*  --   --   --   HCT 29.8* 29.5*  --   --   --   PLT 599* 637*  --   --   --   HEPARINUNFRC 0.57 0.69 0.54 0.44 0.48  CREATININE 0.75 0.62  --   --  0.59    Estimated Creatinine Clearance: 100.5 mL/min (by C-G formula based on Cr of 0.59).  Medications:  Infusions:  . 0.9 % NaCl with KCl 20 mEq / L    . Marland KitchenTPN (CLINIMIX-E) Adult 83 mL/hr at 03/04/15 1753   And  . fat emulsion 240 mL (03/04/15 1753)  . heparin 1,950 Units/hr (03/04/15 2245)  PTA: apixaban 5mg  BID LD 1/6@ 2100  Assessment: 50 yo F admitted with sigmoid diverticulitis and microperforation currently being treated conservatively with bowel rest and IV antibiotics.  She is on chronic Apixaban for hx Afib which was held on admission.  Pharmacy asked to manage heparin bridge while off apixaban.  Heparin held 1/13 for IR drain placement, Heparin resumption delayed by loss of IV access.   PICC placed 1/14  03/05/2015:  Heparin level therapeutic on 1950 units/hr  H/H low and pltc elevated yesterday  No reported bleeding except slight vaginal bleeding overnight 1/13.  New orders received from surgery to transition from IV heparin back to PO Eliquis.  Plan: At 12  noon:  Discontinue Heparin infusion  Begin Eliquis 5 mg PO b.i.d.  Clayburn Pert, PharmD, BCPS Pager: [phone] 03/05/2015  10:16 AM

## 2015-03-05 NOTE — Progress Notes (Signed)
Pt has refused CPAP for the night.  RT to monitor and assess as needed.  

## 2015-03-05 NOTE — Progress Notes (Signed)
ANTICOAGULATION CONSULT NOTE - Follow Up Consult  Pharmacy Consult for Heparin Indication: bridge therapy for Afib while off Apixaban  Allergies  Allergen Reactions  . Benadryl [Diphenhydramine Hcl] Hives and Itching  . Codeine Nausea And Vomiting  . Dilaudid [Hydromorphone Hcl]     "feels like she dying '  . Percocet [Oxycodone-Acetaminophen] Hives and Itching   Patient Measurements: Height: 5\' 2"  (157.5 cm) Weight: 256 lb (116.121 kg) IBW/kg (Calculated) : 50.1 HEPARIN DW (KG): 78.7  Vital Signs: Temp: 98.6 F (37 C) (01/20 0645) Temp Source: Oral (01/20 0645) BP: 130/80 mmHg (01/20 0645) Pulse Rate: 99 (01/20 0645)  Labs:  Recent Labs  03/03/15 0528 03/04/15 0427 03/04/15 1415 03/04/15 2200 03/05/15 0430  HGB 9.6* 9.3*  --   --   --   HCT 29.8* 29.5*  --   --   --   PLT 599* 637*  --   --   --   HEPARINUNFRC 0.57 0.69 0.54 0.44 0.48  CREATININE 0.75 0.62  --   --  0.59    Estimated Creatinine Clearance: 100.5 mL/min (by C-G formula based on Cr of 0.59).  Medications:  Infusions:  . Marland KitchenTPN (CLINIMIX-E) Adult 83 mL/hr at 03/04/15 1753   And  . fat emulsion 240 mL (03/04/15 1753)  . heparin 1,950 Units/hr (03/04/15 2245)  . 0.9 % sodium chloride with kcl 10 mL/hr at 03/03/15 0330  PTA: apixaban 5mg  BID LD 1/6@ 2100  Assessment: 50 yo F admitted with sigmoid diverticulitis and microperforation currently being treated conservatively with bowel rest and IV antibiotics.  She is on chronic Apixaban for hx Afib which was held on admission.  Pharmacy asked to manage heparin bridge while off apixaban.  Heparin held 1/13 for IR drain placement, Heparin resumption delayed by loss of IV access.   PICC placed 1/14  03/05/2015:  Heparin level therapeutic on 1950 units/hr  H/H low and pltc elevated yesterday  No reported bleeding except slight vaginal bleeding overnight 1/13.  MD holding off on resuming apixaban until PO intake improves. Now on [place] diet   Goal of  Therapy:  Heparin level 0.3-0.7 units/ml Monitor platelets by anticoagulation protocol: Yes   Plan:   Continue Heparin at 1950units/hr  Daily Heparin level, CBC  Await word on transitioning back to apixaban.  Clayburn Pert, PharmD, BCPS Pager: [phone] 03/05/2015  8:44 AM

## 2015-03-05 NOTE — Progress Notes (Signed)
Patient ID: Crystal Brennan, female   DOB: 11/27/1965, 50 y.o.   MRN: [id number] [facility] PROGRESS NOTE  DILPREET LOVINS [id number] DOB: 1965-08-31 DOA: 02/20/2015 PCP: No PCP Per Patient will set up with [facility] on discharge   Brief narrative:    50 year-old female with past medical history of atrial fibrillation on anticoagulation with Apixaban, hypertension, obstructive sleep apnea CPAP at nighttime, recurrent diverticulitis (3 prior episodes). Patient presented to [facility] hospital 02/20/2015 with worsening abdominal pain in the lower abdomen for 2 days prior to the admission with no associated nausea or vomiting. She was found to have sigmoid diverticulitis and microperforation. She was treated conservatively with bowel rest, antibiotics, nothing by mouth, IV fluids and surgery has seen her in consultation. She did undergo percutaneous drain placement on 02/26/2015 with no bacteria seen in the culture from the fluid. Repeat CT scan shows improvement in abscess.  Assessment/Plan:    Principal Problem: Perforated sigmoid diverticulitis with pneumoperitoneum/ Lower abdominal pain / Pelvic abscess in female - Treated conservatively with bowel rest, nothing by mouth, IV fluids and pain management on admission - Patient was on cipro, flagyl,zosyn since admission and then on ertapenem since 02/25/2015 - S/p percutaneous drain placement by IR 02/26/2015 with cultures so far showing no growth - CT scan repeated 02/25/2015 demonstrated a diverticular abscess to be completely drained - Advance diet per surgery, tolerated full liquids - Still on TNA, hope to discontinue this today if she is advanced to regular solid diet  Active Problems: Hypokalemia  - Secondary to GI losses - Potassium WNL  Anemia of chronic disease - Secondary to chronic anticoagulation - Hemoglobin remains stable, 9.3  Chronic diastolic CHF - Compensated.   Proximal A. Fib - CHA2DS2-VASc score: at least 3. - On  heparin drip  - On Eliquis at home- last dose 02/19/2015.  - Rate control with Cardizem and metoprolol  Essential hypertension - Continue Cardizem and metoprolol  OSA - Continue nightly CPAP.  Tobacco abuse - Quit in 11/16  Thrombocytosis - Likely reactive due to acute illness   Anxiety - Stable - Not anxious  Morbid obesity due to excess calorie - Continue TNA for now because of acute illness - Counseled on diet  DVT Prophylaxis  - On heparin drip   Code Status: Full.  Family Communication:  plan of care discussed with the patient and her daughter at the bedside  Disposition Plan: Home once cleared by surgery   IV access:  Peripheral IV PICC line   Procedures and diagnostic studies:    Ct Abdomen Pelvis W Contrast 03/01/2015  1. Persistent but definite interval improvement of sigmoid diverticulitis. 2. Drained diverticular abscess.   Dg Abd 2 Views 03/01/2015   Normal small bowel gas pattern. Some colonic gas noted throughout the colon. No significant colonic distension. Drainage catheter is noted in right lower abdomen   Dg Abd 1 View 02/22/2015 Oral contrast seen within colon. Diverticulosis noted. No evidence of bowel obstruction.   Ct Abdomen Pelvis W Contrast 02/25/2015 Perforated sigmoid diverticulitis which has worsened compared with 02/20/2015. There is now small volume pneumoperitoneum. Previously seen small amount of contained extraluminal air adjacent to the sigmoid colon is significantly larger measuring approximately 4.9 x 5.6 cm. 2. Small right pleural effusion. Bibasilar airspace disease likely reflecting atelectasis. These results were called by telephone at the time of interpretation on 02/25/2015 at 1:52 pm to Dr. Barkley Bruns, who verbally acknowledged these results.   Ct Abdomen Pelvis W Contrast 02/20/2015  Sigmoid diverticulitis. There appears to be 2 sections of the mid sigmoid colon there are inflamed, both projecting in the right lower quadrant. Areas  associated extraluminal air reflecting diverticular perforation, but no evidence of an abscess. 2. No other acute findings. 3. Multiple other noninflamed colonic diverticula. 4. Stable small hepatic cysts.  Ct Image Guided Drainage By Percutaneous Catheter 02/26/2015 CT-guided percutaneous catheter drainage performed of a diverticular abscess. A 12 French drain was placed and attached to suction bulb drainage.  Medical Consultants:  Surgery  IAnti-Infectives:   Ertapenem 02/25/15 >>  Cipro 02/20/15 >> 02/22/15 Flagyl 02/20/15 >> 02/22/15 Zosyn 02/22/15 >> 02/25/15   Leisa Lenz, MD  [facility] Pager [phone]  Time spent in minutes: 15 minutes  If 7PM-7AM, please contact night-coverage [email or web address] Password [email or web address] 03/05/2015, 9:22 AM   LOS: 13 days    HPI/Subjective: No acute overnight events. Patient feels better this morning.  Objective: Filed Vitals:   03/04/15 1500 03/04/15 2200 03/04/15 2321 03/05/15 0645  BP: 101/55 107/61  130/80  Pulse: 81 86  99  Temp: 98 F (36.7 C) 98.1 F (36.7 C)  98.6 F (37 C)  TempSrc: Oral Oral  Oral  Resp: 20 20 18 18   Height:      Weight:      SpO2: 97% 97%  100%    Intake/Output Summary (Last 24 hours) at 03/05/15 [id number] Last data filed at 03/05/15 0600  Gross per 24 hour  Intake 3591.3 ml  Output      0 ml  Net 3591.3 ml    Exam:   General:  Pt is not in acute distress  Cardiovascular: Rate controlled, S1/S2 (+)  Respiratory: Bilateral air entry, no wheezing  Abdomen: Appreciate bowel sounds, tenderness in lower abdomen without guarding  Extremities: No edema, palpable pulses  Neuro: No focal deficits  Data Reviewed: Basic Metabolic Panel:  Recent Labs Lab 03/01/15 0705 03/02/15 0455 03/03/15 0528 03/04/15 0427 03/05/15 0430  NA 139 138 140 138 137  K 4.0 3.7 4.2 4.0 4.3  CL 105 101 103 101 102  CO2 24 26 26 26 25   GLUCOSE 95 116* 130* 123* 157*  BUN <5* 5* 8 10 10   CREATININE 0.61 0.59 0.75 0.62  0.59  CALCIUM 8.5* 8.7* 9.3 8.9 9.1  MG  --  2.3 2.2 2.3  --   PHOS  --  5.1* 4.8* 4.8* 4.7*   Liver Function Tests:  Recent Labs Lab 03/01/15 1110 03/02/15 0455 03/03/15 0528 03/04/15 0427  AST 19 20 21 20   ALT 24 13* 15 13*  ALKPHOS 35* 37* 38 37*  BILITOT 0.6 0.6 0.3 0.6  PROT 6.4* 6.7 7.1 6.9  ALBUMIN 2.7* 2.9* 3.0* 2.9*   No results for input(s): LIPASE, AMYLASE in the last 168 hours. No results for input(s): AMMONIA in the last 168 hours. CBC:  Recent Labs Lab 02/28/15 0610 03/01/15 0705 03/02/15 0455 03/03/15 0528 03/04/15 0427  WBC 7.2 6.5 7.2 8.7 7.1  NEUTROABS  --   --  5.0  --   --   HGB 8.7* 8.9* 9.2* 9.6* 9.3*  HCT 26.6* 27.0* 28.0* 29.8* 29.5*  MCV 81.6 80.6 81.2 81.0 82.2  PLT 414* 468* 523* 599* 637*   Cardiac Enzymes: No results for input(s): CKTOTAL, CKMB, CKMBINDEX, TROPONINI in the last 168 hours. BNP: Invalid input(s): POCBNP CBG:  Recent Labs Lab 03/04/15 0625 03/04/15 1219 03/04/15 1719 03/04/15 2243 03/05/15 0758  GLUCAP 139* 125* 144* 137* 156*    Results for  orders placed or performed during the hospital encounter of 02/20/15  Surgical pcr screen     Status: Abnormal   Collection Time: 02/26/15  5:34 AM  Result Value Ref Range Status   MRSA, PCR NEGATIVE NEGATIVE Final   Staphylococcus aureus POSITIVE (A) NEGATIVE Final  Culture, routine-abscess     Status: None   Collection Time: 02/26/15  3:19 PM  Result Value Ref Range Status   Specimen Description ABSCESS DIVERTICULAR  Final   Special Requests NONE  Final   Gram Stain   Final    ABUNDANT WBC PRESENT,BOTH PMN AND MONONUCLEAR NO SQUAMOUS EPITHELIAL CELLS SEEN NO ORGANISMS SEEN Performed at [facility]    Culture   Final    NO GROWTH 2 DAYS Performed at [facility]    Report Status 03/01/2015 FINAL  Final  Anaerobic culture     Status: None (Preliminary result)   Collection Time: 02/26/15  3:19 PM  Result Value Ref Range Status   Specimen  Description ABSCESS DIVERTICULAR  Final   Special Requests NONE  Final   Gram Stain   Final    ABUNDANT WBC PRESENT,BOTH PMN AND MONONUCLEAR NO SQUAMOUS EPITHELIAL CELLS SEEN NO ORGANISMS SEEN Performed at [facility]    Culture   Final    NO ANAEROBES ISOLATED; CULTURE IN PROGRESS FOR 5 DAYS Performed at [facility]    Report Status PENDING  Incomplete     Scheduled Meds: . clotrimazole  1 Applicatorful Vaginal QHS  . diltiazem  180 mg Oral Daily  . ertapenem  1 g Intravenous Q24H  . insulin aspart  0-9 Units Subcutaneous TID AC & HS  . metoprolol  50 mg Oral BID  . ranitidine  150 mg Oral BID  . sodium chloride  3 mL Intravenous Q12H  . zolpidem  5 mg Oral QHS   Continuous Infusions: . 0.9 % NaCl with KCl 20 mEq / L    . Marland KitchenTPN (CLINIMIX-E) Adult 83 mL/hr at 03/04/15 1753   And  . fat emulsion 240 mL (03/04/15 1753)  . Marland KitchenTPN (CLINIMIX-E) Adult     And  . fat emulsion    . heparin 1,950 Units/hr (03/04/15 2245)

## 2015-03-06 MED ORDER — ONDANSETRON HCL 4 MG PO TABS: 4.0000 mg | ORAL_TABLET | Freq: Three times a day (TID) | ORAL | Status: DC | PRN

## 2015-03-06 MED ORDER — HYDROCODONE-ACETAMINOPHEN 5-325 MG PO TABS: 1.0000 | ORAL_TABLET | ORAL | Status: DC | PRN

## 2015-03-06 MED ORDER — CIPROFLOXACIN HCL 500 MG PO TABS
500.0000 mg | ORAL_TABLET | Freq: Two times a day (BID) | ORAL | Status: DC
Start: 2015-03-06 — End: 2015-06-10

## 2015-03-06 MED ORDER — METRONIDAZOLE 500 MG PO TABS: 500.0000 mg | ORAL_TABLET | Freq: Three times a day (TID) | ORAL | Status: DC

## 2015-03-06 MED ORDER — HEPARIN SOD (PORK) LOCK FLUSH 100 UNIT/ML IV SOLN: 250.0000 [IU] | INTRAVENOUS | Status: DC | PRN

## 2015-03-06 MED ORDER — ZOLPIDEM TARTRATE 5 MG PO TABS: 5.0000 mg | ORAL_TABLET | Freq: Every evening | ORAL | Status: DC | PRN

## 2015-03-06 NOTE — Discharge Instructions (Signed)
Diverticulitis Diverticulitis is when small pockets that have formed in your colon (large intestine) become infected or swollen. HOME CARE  Follow your doctor's instructions.  Follow a special diet if told by your doctor.  When you feel better, your doctor may tell you to change your diet. You may be told to eat a lot of fiber. Fruits and vegetables are good sources of fiber. Fiber makes it easier to poop (have bowel movements).  Take supplements or probiotics as told by your doctor.  Only take medicines as told by your doctor.  Keep all follow-up visits with your doctor. GET HELP IF:  Your pain does not get better.  You have a hard time eating food.  You are not pooping like normal. GET HELP RIGHT AWAY IF:  Your pain gets worse.  Your problems do not get better.  Your problems suddenly get worse.  You have a fever.  You keep throwing up (vomiting).  You have bloody or black, tarry poop (stool). MAKE SURE YOU:   Understand these instructions.  Will watch your condition.  Will get help right away if you are not doing well or get worse.   This information is not intended to replace advice given to you by your health care provider. Make sure you discuss any questions you have with your health care provider.   Document Released: 07/19/2007 Document Revised: 02/04/2013 Document Reviewed: 12/25/2012 Elsevier Interactive Patient Education 2016 [employer].  Diverticulitis Diverticulitis is inflammation or infection of small pouches in your colon that form when you have a condition called diverticulosis. The pouches in your colon are called diverticula. Your colon, or large intestine, is where water is absorbed and stool is formed. Complications of diverticulitis can include:  Bleeding.  Severe infection.  Severe pain.  Perforation of your colon.  Obstruction of your colon. CAUSES  Diverticulitis is caused by bacteria. Diverticulitis happens when stool becomes  trapped in diverticula. This allows bacteria to grow in the diverticula, which can lead to inflammation and infection. RISK FACTORS People with diverticulosis are at risk for diverticulitis. Eating a diet that does not include enough fiber from fruits and vegetables may make diverticulitis more likely to develop. SYMPTOMS  Symptoms of diverticulitis may include:  Abdominal pain and tenderness. The pain is normally located on the left side of the abdomen, but may occur in other areas.  Fever and chills.  Bloating.  Cramping.  Nausea.  Vomiting.  Constipation.  Diarrhea.  Blood in your stool. DIAGNOSIS  Your health care provider will ask you about your medical history and do a physical exam. You may need to have tests done because many medical conditions can cause the same symptoms as diverticulitis. Tests may include:  Blood tests.  Urine tests.  Imaging tests of the abdomen, including X-rays and CT scans. When your condition is under control, your health care provider may recommend that you have a colonoscopy. A colonoscopy can show how severe your diverticula are and whether something else is causing your symptoms. TREATMENT  Most cases of diverticulitis are mild and can be treated at home. Treatment may include:  Taking over-the-counter pain medicines.  Following a clear liquid diet.  Taking antibiotic medicines by mouth for 7-10 days. More severe cases may be treated at a hospital. Treatment may include:  Not eating or drinking.  Taking prescription pain medicine.  Receiving antibiotic medicines through an IV tube.  Receiving fluids and nutrition through an IV tube.  Surgery. HOME CARE INSTRUCTIONS  Follow your health care provider's instructions carefully.  Follow a full liquid diet or other diet as directed by your health care provider. After your symptoms improve, your health care provider may tell you to change your diet. He or she may recommend you eat  a high-fiber diet. Fruits and vegetables are good sources of fiber. Fiber makes it easier to pass stool.  Take fiber supplements or probiotics as directed by your health care provider.  Only take medicines as directed by your health care provider.  Keep all your follow-up appointments. SEEK MEDICAL CARE IF:   Your pain does not improve.  You have a hard time eating food.  Your bowel movements do not return to normal. SEEK IMMEDIATE MEDICAL CARE IF:   Your pain becomes worse.  Your symptoms do not get better.  Your symptoms suddenly get worse.  You have a fever.  You have repeated vomiting.  You have bloody or black, tarry stools. MAKE SURE YOU:   Understand these instructions.  Will watch your condition.  Will get help right away if you are not doing well or get worse.   This information is not intended to replace advice given to you by your health care provider. Make sure you discuss any questions you have with your health care provider.   Document Released: 11/09/2004 Document Revised: 02/04/2013 Document Reviewed: 12/25/2012 Elsevier Interactive Patient Education [employer].

## 2015-03-06 NOTE — Progress Notes (Signed)
Subjective: Pt doing well.  Tol reg diet  Objective: Vital signs in last 24 hours: Temp:  [98.4 F (36.9 C)-98.9 F (37.2 C)] 98.9 F (37.2 C) (01/21 0636) Pulse Rate:  [91-96] 91 (01/21 0439) Resp:  [20] 20 (01/21 0636) BP: (96-120)/(54-69) 96/56 mmHg (01/21 0636) SpO2:  [96 %-98 %] 96 % (01/21 0636) Last BM Date: 03/05/15  Intake/Output from previous day: 01/20 0701 - 01/21 0700 In: 1741 [P.O.:720; I.V.:240; IV Piggyback:50; TPN:731] Out: 1 [Urine:1] Intake/Output this shift:    General appearance: alert and cooperative GI: soft, min ttp, LLQ  Lab Results:   Recent Labs  03/04/15 0427  WBC 7.1  HGB 9.3*  HCT 29.5*  PLT 637*   BMET  Recent Labs  03/04/15 0427 03/05/15 0430  NA 138 137  K 4.0 4.3  CL 101 102  CO2 26 25  GLUCOSE 123* 157*  BUN 10 10  CREATININE 0.62 0.59  CALCIUM 8.9 9.1    Studies/Results: Ir Sinus/fist Tube Chk-non Gi  03/04/2015  CLINICAL DATA:  50 year old female with a history of diverticular abscess which was treated with percutaneous drain placement on 02/26/2015. Follow-up CT scan on a 116 shows no significant residual abscess. Drain output has significantly decreased in the patient is clinically improving. She presents for drain injection prior to possible removal. EXAM: SINUS TRACT INJECTION/FISTULOGRAM Date: 03/04/2015 PROCEDURE: 1. Contrast injection through existing drainage catheter. Interventional Radiologist:  Criselda Peaches, MD ANESTHESIA/SEDATION: None MEDICATIONS: None FLUOROSCOPY TIME:  36 seconds for a total of 44 mGy CONTRAST:  5 mL Omnipaque 300 TECHNIQUE: Informed consent was obtained from the patient following explanation of the procedure, risks, benefits and alternatives. The patient understands, agrees and consents for the procedure. All questions were addressed. A time out was performed. Under real-time fluoroscopic evaluation, the existing percutaneous drain was injected with contrast material. Contrast fills a  collapsed abscess cavity. The injected contrast material was evaluated in bilateral obliquities and intermittently imaged over time. There is no evidence of filling of the adjacent colon. The contrast material is stagnant and un changing. The decision was made to remove the drain. The retention suture was cut and removed. The drain was cut and pulled. A bandage was applied. COMPLICATIONS: None IMPRESSION: 1. Negative for fistulous connection with the colon. 2. The drain was removed. Electronically Signed   By: Jacqulynn Cadet M.D.   On: 03/04/2015 16:35    Anti-infectives: Anti-infectives    Start     Dose/Rate Route Frequency Ordered Stop   02/25/15 1600  ertapenem (INVANZ) 1 g in sodium chloride 0.9 % 50 mL IVPB     1 g 100 mL/hr over 30 Minutes Intravenous Every 24 hours 02/25/15 1444     02/22/15 1400  piperacillin-tazobactam (ZOSYN) IVPB 3.375 g  Status:  Discontinued     3.375 g 12.5 mL/hr over 240 Minutes Intravenous 3 times per day 02/22/15 1220 02/25/15 1444   02/22/15 1200  ciprofloxacin (CIPRO) tablet 500 mg  Status:  Discontinued     500 mg Oral 2 times daily 02/22/15 1108 02/22/15 1220   02/21/15 1000  ciprofloxacin (CIPRO) IVPB 400 mg  Status:  Discontinued     400 mg 200 mL/hr over 60 Minutes Intravenous Every 12 hours 02/20/15 2343 02/22/15 1108   02/21/15 0600  metroNIDAZOLE (FLAGYL) IVPB 500 mg  Status:  Discontinued     500 mg 100 mL/hr over 60 Minutes Intravenous Every 8 hours 02/20/15 2343 02/22/15 1220   02/20/15 2200  ciprofloxacin (CIPRO)  IVPB 400 mg  Status:  Discontinued     400 mg 200 mL/hr over 60 Minutes Intravenous  Once 02/20/15 2152 02/22/15 1108   02/20/15 2200  metroNIDAZOLE (FLAGYL) IVPB 500 mg     500 mg 100 mL/hr over 60 Minutes Intravenous  Once 02/20/15 2152 02/21/15 0125      Assessment/Plan: Recurrent diverticulitis with abscess -Con't diet as tol -Pt will need f/u with [place] in 2-3 weeks OK for DC from surgical standpoint   LOS: 14 days     Rosario Jacks., [facility] 03/06/2015

## 2015-03-06 NOTE — Discharge Summary (Signed)
Physician Discharge Summary  Crystal Brennan [id number] DOB: 1965-07-17 DOA: 02/20/2015  PCP: No PCP Per Patient can follow with [facility] on discharge   Admit date: 02/20/2015 Discharge date: 03/06/2015  Recommendations for Outpatient Follow-up:  1. Cipro and flagyl on discharge for 7 days   Discharge Diagnoses:  Principal Problem:   Diverticulitis of large intestine with perforation without bleeding Active Problems:   Anxiety   PAF (paroxysmal atrial fibrillation) (HCC)   OSA (obstructive sleep apnea)   Hypokalemia   Chronic diastolic congestive heart failure, NYHA class 1 (HCC)   Anemia of chronic disease   Thrombocytosis (HCC)   Benign essential HTN   Morbid obesity due to excess calories (HCC)   Pelvic abscess in female    Discharge Condition: stable   Diet recommendation: as tolerated   History of present illness:  50 year-old female with past medical history of atrial fibrillation on anticoagulation with Apixaban, hypertension, obstructive sleep apnea CPAP at nighttime, recurrent diverticulitis (3 prior episodes). Patient presented to [facility] hospital 02/20/2015 with worsening abdominal pain in the lower abdomen for 2 days prior to the admission with no associated nausea or vomiting. She was found to have sigmoid diverticulitis and microperforation. She was treated conservatively with bowel rest, antibiotics, nothing by mouth, IV fluids and surgery has seen her in consultation. She did undergo percutaneous drain placement on 02/26/2015 with no bacteria seen in the culture from the fluid. Repeat CT scan shows improvement in abscess.  Hospital Course:   Assessment/Plan:    Principal Problem: Perforated sigmoid diverticulitis with pneumoperitoneum/ Lower abdominal pain / Pelvic abscess in female - Treated conservatively with bowel rest, nothing by mouth, IV fluids and pain management on admission - Patient was on cipro, flagyl,zosyn since admission and then on ertapenem  since 02/25/2015 - S/p percutaneous drain placement by IR 02/26/2015 with cultures so far showing no growth - CT scan repeated 02/25/2015 demonstrated a diverticular abscess to be completely drained - Drain removed 1/19 - Tolerates regular diet - TNA stopped 1/20  Active Problems: Hypokalemia  - Secondary to GI losses - Potassium WNL  Anemia of chronic disease - Secondary to chronic anticoagulation - Hemoglobin stable   Chronic diastolic CHF - Compensated.   Proximal A. Fib - CHA2DS2-VASc score: at least 3. - Resume apixaban on discharge  - Rate control with Cardizem and metoprolol  Essential hypertension - Continue Cardizem and metoprolol  OSA - Continue nightly CPAP.  Tobacco abuse - Quit in 11/16  Thrombocytosis - Likely reactive due to acute illness   Anxiety - Stable - Not anxious  Morbid obesity due to excess calorie - Diet as tolerated - TNA stopped 1/20 - Counseled on diet  DVT Prophylaxis  - On heparin drip in hospital; - Apixaban on discharge    Code Status: Full.  Family Communication: plan of care discussed with the patient and her daughter at the bedside    IV access:  Peripheral IV PICC line   Procedures and diagnostic studies:   Ct Abdomen Pelvis W Contrast 03/01/2015 1. Persistent but definite interval improvement of sigmoid diverticulitis. 2. Drained diverticular abscess.   Dg Abd 2 Views 03/01/2015 Normal small bowel gas pattern. Some colonic gas noted throughout the colon. No significant colonic distension. Drainage catheter is noted in right lower abdomen   Dg Abd 1 View 02/22/2015 Oral contrast seen within colon. Diverticulosis noted. No evidence of bowel obstruction.   Ct Abdomen Pelvis W Contrast 02/25/2015 Perforated sigmoid diverticulitis which has worsened compared  with 02/20/2015. There is now small volume pneumoperitoneum. Previously seen small amount of contained extraluminal air adjacent to the sigmoid colon is  significantly larger measuring approximately 4.9 x 5.6 cm. 2. Small right pleural effusion. Bibasilar airspace disease likely reflecting atelectasis. These results were called by telephone at the time of interpretation on 02/25/2015 at 1:52 pm to Dr. Barkley Bruns, who verbally acknowledged these results.   Ct Abdomen Pelvis W Contrast 02/20/2015 Sigmoid diverticulitis. There appears to be 2 sections of the mid sigmoid colon there are inflamed, both projecting in the right lower quadrant. Areas associated extraluminal air reflecting diverticular perforation, but no evidence of an abscess. 2. No other acute findings. 3. Multiple other noninflamed colonic diverticula. 4. Stable small hepatic cysts.  Ct Image Guided Drainage By Percutaneous Catheter 02/26/2015 CT-guided percutaneous catheter drainage performed of a diverticular abscess. A 12 French drain was placed and attached to suction bulb drainage.  Medical Consultants:  Surgery  IAnti-Infectives:   Ertapenem 02/25/15 >> 03/06/2015  Cipro 02/20/15 >> 02/22/15 Flagyl 02/20/15 >> 02/22/15 Zosyn 02/22/15 >> 02/25/15   Signed:  Leisa Lenz, MD  [facility] 03/06/2015, 1:03 PM  Pager #: [phone]  Time spent in minutes: less than 30 minutes   Discharge Exam: Filed Vitals:   03/06/15 0439 03/06/15 0636  BP: 103/54 96/56  Pulse: 91   Temp: 98.4 F (36.9 C) 98.9 F (37.2 C)  Resp: 20 20   Filed Vitals:   03/05/15 0645 03/05/15 2200 03/06/15 0439 03/06/15 0636  BP: 130/80 120/69 103/54 96/56  Pulse: 99 96 91   Temp: 98.6 F (37 C) 98.4 F (36.9 C) 98.4 F (36.9 C) 98.9 F (37.2 C)  TempSrc: Oral Oral Axillary Oral  Resp: 18 20 20 20   Height:      Weight:      SpO2: 100% 98% 96% 96%    General: Pt is alert, follows commands appropriately, not in acute distress Cardiovascular: Regular rate and rhythm, S1/S2 +, no murmurs Respiratory: Clear to auscultation bilaterally, no wheezing, no crackles, no rhonchi Abdominal:  Soft, non tender, non distended, bowel sounds +, no guarding Extremities: no edema, no cyanosis, pulses palpable bilaterally DP and PT Neuro: Grossly nonfocal  Discharge Instructions  Discharge Instructions    Call MD for:  difficulty breathing, headache or visual disturbances    Complete by:  As directed      Call MD for:  persistant dizziness or light-headedness    Complete by:  As directed      Call MD for:  persistant nausea and vomiting    Complete by:  As directed      Call MD for:  severe uncontrolled pain    Complete by:  As directed      Diet - low sodium heart healthy    Complete by:  As directed      Discharge instructions    Complete by:  As directed   Continue Cipro and flagyl for 7 more days on discharge.     Increase activity slowly    Complete by:  As directed             Medication List    STOP taking these medications        ALKA-SELTZER PO     hydrochlorothiazide 25 MG tablet  Commonly known as:  HYDRODIURIL     ibuprofen 200 MG tablet  Commonly known as:  ADVIL,MOTRIN     ketorolac 10 MG tablet  Commonly known as:  TORADOL  TAKE these medications        apixaban 5 MG Tabs tablet  Commonly known as:  ELIQUIS  Take 1 tablet (5 mg total) by mouth 2 (two) times daily.     CHLOR-TRIMETON PO  Take 2 tablets by mouth daily as needed (allergies).     ciprofloxacin 500 MG tablet  Commonly known as:  CIPRO  Take 1 tablet (500 mg total) by mouth 2 (two) times daily.     diltiazem 180 MG 24 hr capsule  Commonly known as:  CARDIZEM CD  Take 1 capsule (180 mg total) by mouth daily.     HYDROcodone-acetaminophen 5-325 MG tablet  Commonly known as:  NORCO/VICODIN  Take 1-2 tablets by mouth every 4 (four) hours as needed for moderate pain.     metoprolol 50 MG tablet  Commonly known as:  LOPRESSOR  Take 1 tablet (50 mg total) by mouth 2 (two) times daily.     metroNIDAZOLE 500 MG tablet  Commonly known as:  FLAGYL  Take 1 tablet (500 mg  total) by mouth 3 (three) times daily.     ondansetron 4 MG tablet  Commonly known as:  ZOFRAN  Take 1 tablet (4 mg total) by mouth every 8 (eight) hours as needed for nausea or vomiting.     zolpidem 5 MG tablet  Commonly known as:  AMBIEN  Take 1 tablet (5 mg total) by mouth at bedtime as needed for sleep.           Follow-up Information    Follow up with NEWMAN,DAVID H, MD In 2 weeks.   Specialty:  General Surgery   Contact information:   [place] [address] [phone]        The results of significant diagnostics from this hospitalization (including imaging, microbiology, ancillary and laboratory) are listed below for reference.    Significant Diagnostic Studies: Dg Abd 1 View  02/22/2015  CLINICAL DATA:  Sigmoid diverticulitis.  Abdominal pain. EXAM: ABDOMEN - 1 VIEW COMPARISON:  CT on 02/20/2015 FINDINGS: Oral contrast from previous CT is now seen throughout colon. There is no evidence bowel obstruction . Diverticulosis is seen involving the right colon. Right upper quadrant surgical clips are seen from prior cholecystectomy. IMPRESSION: Oral contrast seen within colon. Diverticulosis noted. No evidence of bowel obstruction. Electronically Signed   By: Earle Gell M.D.   On: 02/22/2015 08:36   Ct Abdomen Pelvis W Contrast  03/01/2015  CLINICAL DATA:  Followup abscess drainage 02/26/2015. History of diverticular abscess. EXAM: CT ABDOMEN AND PELVIS WITH CONTRAST TECHNIQUE: Multidetector CT imaging of the abdomen and pelvis was performed using the standard protocol following bolus administration of intravenous contrast. CONTRAST:  162mL OMNIPAQUE IOHEXOL 300 MG/ML  SOLN COMPARISON:  CT scan 02/26/2015 and 02/25/2015 FINDINGS: Lower chest: Small bilateral pleural effusions with overlying atelectasis. Hepatobiliary: Small scattered hepatic cysts are again demonstrated. No worrisome hepatic lesions or intrahepatic biliary dilatation. The gallbladder  surgically absent. No common bile duct dilatation. Pancreas: No mass, inflammation or ductal dilatation. Spleen: Normal size.  No focal lesions. Adrenals/Urinary Tract: The adrenal glands and kidneys are normal. Stomach/Bowel: The stomach, duodenum and small bowel are unremarkable. Persistent but definitely improving diverticulitis involving the sigmoid colon. The diverticular abscess has been completely drained. Vascular/Lymphatic: Small scattered mesenteric and retroperitoneal lymph nodes likely inflammatory. The aorta and branch vessels are normal. The major venous structures are patent. Other: The uterus and ovaries are normal. Small amount of free pelvic fluid.  No pelvic abscess. The bladder appears normal. No inguinal mass or adenopathy. Small scattered lymph nodes are stable. Musculoskeletal: No significant findings. IMPRESSION: 1. Persistent but definite interval improvement of sigmoid diverticulitis. 2. Drained diverticular abscess. Electronically Signed   By: Marijo Sanes M.D.   On: 03/01/2015 15:42   Ct Abdomen Pelvis W Contrast  02/25/2015  CLINICAL DATA:  Known diverticulitis, difficulty walking and voiding EXAM: CT ABDOMEN AND PELVIS WITH CONTRAST TECHNIQUE: Multidetector CT imaging of the abdomen and pelvis was performed using the standard protocol following bolus administration of intravenous contrast. CONTRAST:  171mL OMNIPAQUE IOHEXOL 300 MG/ML  SOLN COMPARISON:  02/20/2015 FINDINGS: Lower chest: Small right pleural effusion. Bibasilar airspace disease likely reflecting atelectasis. Hepatobiliary: Normal liver.  Prior cholecystectomy. Pancreas: Normal. Spleen: Normal. Adrenals/Urinary Tract: Normal adrenal glands. Normal kidneys. No obstructive uropathy. Normal bladder. Stomach/Bowel: Sigmoid diverticulosis with bowel wall thickening and severe surrounding inflammatory changes most consistent with acute diverticulitis. The previously seen small amount of extraluminal air adjacent to the sigmoid  colon is significantly larger measuring approximately 4.9 x 5.6 cm. Small volume pneumoperitoneum new compared with the prior examination. Vascular/Lymphatic: Normal caliber abdominal aorta. No lymphadenopathy. Reproductive: Normal uterus.  No adnexal mass. Musculoskeletal: No lytic or sclerotic osseous lesion. No acute osseous abnormality. IMPRESSION: 1. Perforated sigmoid diverticulitis which has worsened compared with 02/20/2015. There is now small volume pneumoperitoneum. Previously seen small amount of contained extraluminal air adjacent to the sigmoid colon is significantly larger measuring approximately 4.9 x 5.6 cm. 2. Small right pleural effusion. Bibasilar airspace disease likely reflecting atelectasis. These results were called by telephone at the time of interpretation on 02/25/2015 at 1:52 pm to Dr. Barkley Bruns, who verbally acknowledged these results. Electronically Signed   By: Kathreen Devoid   On: 02/25/2015 13:55   Ct Abdomen Pelvis W Contrast  02/20/2015  CLINICAL DATA:  Abdominal pain. Patient believes this is diverticulitis. History of diverticulitis and hypertension as well as sickle cell trait. EXAM: CT ABDOMEN AND PELVIS WITH CONTRAST TECHNIQUE: Multidetector CT imaging of the abdomen and pelvis was performed using the standard protocol following bolus administration of intravenous contrast. CONTRAST:  86mL OMNIPAQUE IOHEXOL 300 MG/ML SOLN, 13mL OMNIPAQUE IOHEXOL 300 MG/ML SOLN COMPARISON:  09/30/2014 FINDINGS: Lung bases: Dependent subsegmental atelectasis. No convincing pneumonia or edema. Heart top-normal in size. Several low-density liver lesions are noted, which do not enhance and are stable from the prior CT consistent with cysts. Largest lies in the posterior segment of the right lobe measuring 1 cm. Liver otherwise unremarkable. Gallbladder surgically absent.  No bile duct dilation. Spleen, pancreas, adrenal glands:  Unremarkable. Kidneys, ureters, bladder:  Normal. Uterus and adnexa:   Unremarkable. Lymph nodes:  No adenopathy. Ascites:  None. Gastrointestinal: Sigmoid colon is redundant. There multiple colonic diverticula. Significant inflammatory changes surround the mid to distal sigmoid colon, with the inflammatory changes centered on to portions of the sigmoid colon, both lying in the right lower quadrant. There is some extraluminal air, but no formed abscess. Other diverticula seen along the remainder of colon with no other inflammatory changes. Stomach and small bowel are unremarkable. Musculoskeletal:  Unremarkable. IMPRESSION: 1. Sigmoid diverticulitis. There appears to be 2 sections of the mid sigmoid colon there are inflamed, both projecting in the right lower quadrant. Areas associated extraluminal air reflecting diverticular perforation, but no evidence of an abscess. 2. No other acute findings. 3. Multiple other noninflamed colonic diverticula. 4. Stable small hepatic cysts. Electronically Signed   By: Lajean Manes M.D.   On:  02/20/2015 21:53   Ir Sinus/fist Tube Chk-non Gi  03/04/2015  CLINICAL DATA:  50 year old female with a history of diverticular abscess which was treated with percutaneous drain placement on 02/26/2015. Follow-up CT scan on a 116 shows no significant residual abscess. Drain output has significantly decreased in the patient is clinically improving. She presents for drain injection prior to possible removal. EXAM: SINUS TRACT INJECTION/FISTULOGRAM Date: 03/04/2015 PROCEDURE: 1. Contrast injection through existing drainage catheter. Interventional Radiologist:  Criselda Peaches, MD ANESTHESIA/SEDATION: None MEDICATIONS: None FLUOROSCOPY TIME:  36 seconds for a total of 44 mGy CONTRAST:  5 mL Omnipaque 300 TECHNIQUE: Informed consent was obtained from the patient following explanation of the procedure, risks, benefits and alternatives. The patient understands, agrees and consents for the procedure. All questions were addressed. A time out was performed. Under  real-time fluoroscopic evaluation, the existing percutaneous drain was injected with contrast material. Contrast fills a collapsed abscess cavity. The injected contrast material was evaluated in bilateral obliquities and intermittently imaged over time. There is no evidence of filling of the adjacent colon. The contrast material is stagnant and un changing. The decision was made to remove the drain. The retention suture was cut and removed. The drain was cut and pulled. A bandage was applied. COMPLICATIONS: None IMPRESSION: 1. Negative for fistulous connection with the colon. 2. The drain was removed. Electronically Signed   By: Jacqulynn Cadet M.D.   On: 03/04/2015 16:35   Dg Abd 2 Views  03/01/2015  CLINICAL DATA:  Acute diverticulitis EXAM: ABDOMEN - 2 VIEW COMPARISON:  02/26/2015 and 02/22/2015 FINDINGS: There is normal small bowel gas pattern. A drainage catheter is noted in right lower abdomen. Some colonic gas noted throughout the colon without colonic distension. Postcholecystectomy surgical clips are noted. IMPRESSION: Normal small bowel gas pattern. Some colonic gas noted throughout the colon. No significant colonic distension. Drainage catheter is noted in right lower abdomen Electronically Signed   By: Lahoma Crocker M.D.   On: 03/01/2015 12:07   Ct Image Guided Drainage By Percutaneous Catheter  02/26/2015  CLINICAL DATA:  Sigmoid diverticulitis with enlarging diverticular abscess. The patient presents for percutaneous drainage. EXAM: CT GUIDED DRAINAGE OF PERITONEAL DIVERTICULAR ABSCESS ANESTHESIA/SEDATION: 2.0 Mg IV Versed 100 mcg IV Fentanyl Total Moderate Sedation Time:  20 minutes PROCEDURE: The procedure, risks, benefits, and alternatives were explained to the patient. Questions regarding the procedure were encouraged and answered. The patient understands and consents to the procedure. CT was performed in a supine position through the lower abdomen and pelvis. The anterior abdominal wall was  prepped with Betadine in a sterile fashion, and a sterile drape was applied covering the operative field. A sterile gown and sterile gloves were used for the procedure. Local anesthesia was provided with 1% Lidocaine. Under CT guidance, an 18 gauge trocar needle was advanced to the level of the diverticular abscess from an anterior approach just to the right of the umbilicus. After needle advanced into the abscess, a guidewire was advanced. The percutaneous tract was dilated and a 12 French pigtail drainage catheter advanced over the wire. The catheter was formed and the wire removed. Additional CT was performed to evaluate drain positioning. A sample of fluid was aspirated from the drain and sent for culture analysis. The drain was flushed and connected to a suction bulb. It was secured at the skin with a Prolene retention suture and StatLock device. COMPLICATIONS: None FINDINGS: After drainage catheter placement, purulent fluid was able to be aspirated. There is good  return of purulent fluid after drain placement. Postprocedural CT also shows complete decompression of the component of air in the diverticular abscess. IMPRESSION: CT-guided percutaneous catheter drainage performed of a diverticular abscess. A 12 French drain was placed and attached to suction bulb drainage. Electronically Signed   By: Aletta Edouard M.D.   On: 02/26/2015 16:43    Microbiology: Recent Results (from the past 240 hour(s))  Surgical pcr screen     Status: Abnormal   Collection Time: 02/26/15  5:34 AM  Result Value Ref Range Status   MRSA, PCR NEGATIVE NEGATIVE Final   Staphylococcus aureus POSITIVE (A) NEGATIVE Final    Comment:        The Xpert SA Assay (FDA approved for NASAL specimens in patients over 70 years of age), is one component of a comprehensive surveillance program.  Test performance has been validated by [facility] for patients greater than or equal to 49 year old. It is not intended to diagnose  infection nor to guide or monitor treatment.   Culture, routine-abscess     Status: None   Collection Time: 02/26/15  3:19 PM  Result Value Ref Range Status   Specimen Description ABSCESS DIVERTICULAR  Final   Special Requests NONE  Final   Gram Stain   Final    ABUNDANT WBC PRESENT,BOTH PMN AND MONONUCLEAR NO SQUAMOUS EPITHELIAL CELLS SEEN NO ORGANISMS SEEN Performed at [facility]    Culture   Final    NO GROWTH 2 DAYS Performed at [facility]    Report Status 03/01/2015 FINAL  Final  Anaerobic culture     Status: None   Collection Time: 02/26/15  3:19 PM  Result Value Ref Range Status   Specimen Description ABSCESS DIVERTICULAR  Final   Special Requests NONE  Final   Gram Stain   Final    ABUNDANT WBC PRESENT,BOTH PMN AND MONONUCLEAR NO SQUAMOUS EPITHELIAL CELLS SEEN NO ORGANISMS SEEN Performed at [facility]    Culture   Final    MODERATE BACTEROIDES THETAIOTAOMICRON Note: BETA LACTAMASE POSITIVE Performed at [facility]    Report Status 03/04/2015 FINAL  Final  Culture, Urine     Status: None   Collection Time: 03/03/15  3:42 PM  Result Value Ref Range Status   Specimen Description URINE, CLEAN CATCH  Final   Special Requests NONE  Final   Culture   Final    MULTIPLE SPECIES PRESENT, SUGGEST RECOLLECTION Performed at [facility]    Report Status 03/05/2015 FINAL  Final     Labs: Basic Metabolic Panel:  Recent Labs Lab 03/01/15 0705 03/02/15 0455 03/03/15 0528 03/04/15 0427 03/05/15 0430  NA 139 138 140 138 137  K 4.0 3.7 4.2 4.0 4.3  CL 105 101 103 101 102  CO2 24 26 26 26 25   GLUCOSE 95 116* 130* 123* 157*  BUN <5* 5* 8 10 10   CREATININE 0.61 0.59 0.75 0.62 0.59  CALCIUM 8.5* 8.7* 9.3 8.9 9.1  MG  --  2.3 2.2 2.3  --   PHOS  --  5.1* 4.8* 4.8* 4.7*   Liver Function Tests:  Recent Labs Lab 03/01/15 1110 03/02/15 0455 03/03/15 0528 03/04/15 0427  AST 19 20 21 20   ALT 24 13* 15 13*  ALKPHOS  35* 37* 38 37*  BILITOT 0.6 0.6 0.3 0.6  PROT 6.4* 6.7 7.1 6.9  ALBUMIN 2.7* 2.9* 3.0* 2.9*   No results for input(s): LIPASE, AMYLASE in the last  168 hours. No results for input(s): AMMONIA in the last 168 hours. CBC:  Recent Labs Lab 02/28/15 0610 03/01/15 0705 03/02/15 0455 03/03/15 0528 03/04/15 0427  WBC 7.2 6.5 7.2 8.7 7.1  NEUTROABS  --   --  5.0  --   --   HGB 8.7* 8.9* 9.2* 9.6* 9.3*  HCT 26.6* 27.0* 28.0* 29.8* 29.5*  MCV 81.6 80.6 81.2 81.0 82.2  PLT 414* 468* 523* 599* 637*   Cardiac Enzymes: No results for input(s): CKTOTAL, CKMB, CKMBINDEX, TROPONINI in the last 168 hours. BNP: BNP (last 3 results) No results for input(s): BNP in the last 8760 hours.  ProBNP (last 3 results) No results for input(s): PROBNP in the last 8760 hours.  CBG:  Recent Labs Lab 03/04/15 0625 03/04/15 1219 03/04/15 1719 03/04/15 2243 03/05/15 0758  GLUCAP 139* 125* 144* 137* 156*

## 2015-03-06 NOTE — Progress Notes (Signed)
Went over d/c instructions with patient and her husband.  Both verbalized understanding.  Patient left with hard scripts, letter to employer, handout on low fiber diet and all other belongings. Virginia Rochester, RN

## 2015-03-24 ENCOUNTER — Other Ambulatory Visit: Payer: Self-pay | Admitting: General Surgery

## 2015-03-24 DIAGNOSIS — K5780 Diverticulitis of intestine, part unspecified, with perforation and abscess without bleeding: Secondary | ICD-10-CM

## 2015-03-25 ENCOUNTER — Inpatient Hospital Stay: Admission: RE | Admit: 2015-03-25 | Payer: Commercial Managed Care - PPO | Source: Ambulatory Visit

## 2015-04-29 ENCOUNTER — Telehealth: Payer: Self-pay | Admitting: *Deleted

## 2015-04-29 NOTE — Telephone Encounter (Signed)
Will forward this note to the number provided. 

## 2015-04-29 NOTE — Telephone Encounter (Signed)
Hold apixaban 3 days prior to procedure and resume after when ok with GI Brian Crenshaw  

## 2015-04-29 NOTE — Telephone Encounter (Signed)
Pt needs clearance for colon surgery and the okay to hold eliquis 5 days prior to the procedure. Will forward for dr Stanford Breed review

## 2015-05-07 NOTE — Progress Notes (Signed)
HPI: FU atrial fibrillation; admitted 11/15 with new onset atrial fibrillation; TSH normal. Lexiscan cardiolite was completed and negative for ischemia; EF 78. Echocardiogram revealed an EF of 65-70% with normal wall motion. Mild concentric hypertrophy. G1DD. Peak PA pressure of 58mmHg. Converted with cardizem. Since last seen, the patient denies any dyspnea on exertion, orthopnea, PND, pedal edema, palpitations, syncope or chest pain. Note patient had recent episode of diverticulitis and will require abdominal surgery. We were asked to evaluate preoperatively.   Current Outpatient Prescriptions  Medication Sig Dispense Refill  . apixaban (ELIQUIS) 5 MG TABS tablet Take 1 tablet (5 mg total) by mouth 2 (two) times daily. 60 tablet 11  . Chlorpheniramine Maleate (CHLOR-TRIMETON PO) Take 2 tablets by mouth daily as needed (allergies).    . ciprofloxacin (CIPRO) 500 MG tablet Take 1 tablet (500 mg total) by mouth 2 (two) times daily. 14 tablet 0  . diltiazem (CARDIZEM CD) 180 MG 24 hr capsule Take 1 capsule (180 mg total) by mouth daily. 30 capsule 11  . HYDROcodone-acetaminophen (NORCO/VICODIN) 5-325 MG tablet Take 1-2 tablets by mouth every 4 (four) hours as needed for moderate pain. 30 tablet 0  . metoprolol (LOPRESSOR) 50 MG tablet Take 1 tablet (50 mg total) by mouth 2 (two) times daily. 60 tablet 11  . metroNIDAZOLE (FLAGYL) 500 MG tablet Take 1 tablet (500 mg total) by mouth 3 (three) times daily. 21 tablet 0  . ondansetron (ZOFRAN) 4 MG tablet Take 1 tablet (4 mg total) by mouth every 8 (eight) hours as needed for nausea or vomiting. 20 tablet 0  . zolpidem (AMBIEN) 5 MG tablet Take 1 tablet (5 mg total) by mouth at bedtime as needed for sleep. 30 tablet 0   No current facility-administered medications for this visit.     Past Medical History  Diagnosis Date  . Diverticulitis 2015  . Atrial fibrillation ([place]) 2015  . Hypertension   . Dysrhythmia   . Anxiety   . Sleep apnea     . Headache     Past Surgical History  Procedure Laterality Date  . Cholecystectomy  2009  . Tubal ligation  198  . Cesarean section  [id number]; 1986; 1988    Social History   Social History  . Marital Status: Married    Spouse Name: N/A  . Number of Children: N/A  . Years of Education: N/A   Occupational History  . Not on file.   Social History Main Topics  . Smoking status: Former Smoker -- 0.33 packs/day for 30 years    Types: Cigarettes    Quit date: 12/31/2013  . Smokeless tobacco: Not on file     Comment: pt smoke electronic cigarette  . Alcohol Use: 1.8 oz/week    3 Glasses of wine, 0 Standard drinks or equivalent per week  . Drug Use: No  . Sexual Activity: Not on file   Other Topics Concern  . Not on file   Social History Narrative   Lives in [place] with fiance.  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  Walks regularly.  Trying to lose weight.    Family History  Problem Relation Age of Onset  . Heart attack Mother     ? in her 70's - never sought treatment  . Pulmonary embolism Mother   . Other Father     ? hx  . Lupus Sister   . Heart failure Sister     ROS: no fevers  or chills, productive cough, hemoptysis, dysphasia, odynophagia, melena, hematochezia, dysuria, hematuria, rash, seizure activity, orthopnea, PND, pedal edema, claudication. Remaining systems are negative.  Physical Exam: Well-developed obese in no acute distress.  Skin is warm and dry.  HEENT is normal.  Neck is supple.  Chest is clear to auscultation with normal expansion.  Cardiovascular exam is regular rate and rhythm.  Abdominal exam nontender or distended. No masses palpated. Extremities show no edema. neuro grossly intact  ECG Sinus rhythm at a rate of 73. Nonspecific ST changes.

## 2015-05-11 ENCOUNTER — Ambulatory Visit (INDEPENDENT_AMBULATORY_CARE_PROVIDER_SITE_OTHER): Payer: Commercial Managed Care - PPO | Admitting: Cardiology

## 2015-05-11 ENCOUNTER — Encounter: Payer: Self-pay | Admitting: Cardiology

## 2015-05-11 ENCOUNTER — Encounter: Payer: Self-pay | Admitting: *Deleted

## 2015-05-11 VITALS — BP 120/80 | HR 73 | Ht 62.0 in | Wt 243.0 lb

## 2015-05-11 DIAGNOSIS — Z01810 Encounter for preprocedural cardiovascular examination: Secondary | ICD-10-CM | POA: Diagnosis not present

## 2015-05-11 DIAGNOSIS — I10 Essential (primary) hypertension: Secondary | ICD-10-CM | POA: Diagnosis not present

## 2015-05-11 DIAGNOSIS — I480 Paroxysmal atrial fibrillation: Secondary | ICD-10-CM

## 2015-05-11 DIAGNOSIS — I4891 Unspecified atrial fibrillation: Secondary | ICD-10-CM | POA: Diagnosis not present

## 2015-05-11 NOTE — Assessment & Plan Note (Signed)
Patient remains in sinus rhythm.Continue Cardizem and beta blocker. Continue apixaban. She will require surgery in the near future for diverticulitis. Discontinue anticoagulation 3 days prior to procedure and resuming after when okay with surgery. She will be at higher risk for postoperative atrial fibrillation given her history of PAF.

## 2015-05-11 NOTE — Assessment & Plan Note (Signed)
Blood pressure controlled. Continue present medications. 

## 2015-05-11 NOTE — Patient Instructions (Signed)
Your physician wants you to follow-up in: [place] will receive a reminder letter in the mail two months in advance. If you don't receive a letter, please call our office to schedule the follow-up appointment.   STOP ELIQUIS 3 DAYS PRIOR TO SURGERY AND RESTART THE DAY AFTER IF OKAY WITH SURGEON

## 2015-05-11 NOTE — Assessment & Plan Note (Signed)
Patient is scheduled to have abdominal surgery for diverticulitis. Previous stress test showed no ischemia. She has not had cardiac symptoms. She may proceed without further cardiac evaluation. Management of anticoagulation as outlined under atrial fibrillation.

## 2015-05-17 ENCOUNTER — Emergency Department (HOSPITAL_COMMUNITY)
Admission: EM | Admit: 2015-05-17 | Discharge: 2015-05-17 | Disposition: A | Discharge status: Home / Self Care | Payer: Commercial Managed Care - PPO | Attending: Emergency Medicine | Admitting: Emergency Medicine

## 2015-05-17 ENCOUNTER — Emergency Department (HOSPITAL_COMMUNITY): Payer: Commercial Managed Care - PPO

## 2015-05-17 ENCOUNTER — Encounter (HOSPITAL_COMMUNITY): Payer: Self-pay | Admitting: Emergency Medicine

## 2015-05-17 DIAGNOSIS — I4891 Unspecified atrial fibrillation: Secondary | ICD-10-CM | POA: Insufficient documentation

## 2015-05-17 DIAGNOSIS — I10 Essential (primary) hypertension: Secondary | ICD-10-CM | POA: Insufficient documentation

## 2015-05-17 DIAGNOSIS — Z79899 Other long term (current) drug therapy: Secondary | ICD-10-CM | POA: Insufficient documentation

## 2015-05-17 DIAGNOSIS — Z87891 Personal history of nicotine dependence: Secondary | ICD-10-CM | POA: Insufficient documentation

## 2015-05-17 DIAGNOSIS — Z9851 Tubal ligation status: Secondary | ICD-10-CM | POA: Diagnosis not present

## 2015-05-17 DIAGNOSIS — Z792 Long term (current) use of antibiotics: Secondary | ICD-10-CM | POA: Diagnosis not present

## 2015-05-17 DIAGNOSIS — Z8659 Personal history of other mental and behavioral disorders: Secondary | ICD-10-CM | POA: Insufficient documentation

## 2015-05-17 DIAGNOSIS — Z8669 Personal history of other diseases of the nervous system and sense organs: Secondary | ICD-10-CM | POA: Insufficient documentation

## 2015-05-17 DIAGNOSIS — K5732 Diverticulitis of large intestine without perforation or abscess without bleeding: Secondary | ICD-10-CM

## 2015-05-17 DIAGNOSIS — R109 Unspecified abdominal pain: Secondary | ICD-10-CM | POA: Diagnosis present

## 2015-05-17 LAB — URINALYSIS, ROUTINE W REFLEX MICROSCOPIC
BILIRUBIN URINE: NEGATIVE
GLUCOSE, UA: NEGATIVE mg/dL
HGB URINE DIPSTICK: NEGATIVE
KETONES UR: NEGATIVE mg/dL
Leukocytes, UA: NEGATIVE
Nitrite: NEGATIVE
PH: 6.5 (ref 5.0–8.0)
PROTEIN: NEGATIVE mg/dL
Specific Gravity, Urine: 1.016 (ref 1.005–1.030)

## 2015-05-17 LAB — CBC
HEMATOCRIT: 33.1 % — AB (ref 36.0–46.0)
Hemoglobin: 10.8 g/dL — ABNORMAL LOW (ref 12.0–15.0)
MCH: 23.8 pg — ABNORMAL LOW (ref 26.0–34.0)
MCHC: 32.6 g/dL (ref 30.0–36.0)
MCV: 72.9 fL — AB (ref 78.0–100.0)
PLATELETS: 393 10*3/uL (ref 150–400)
RBC: 4.54 MIL/uL (ref 3.87–5.11)
RDW: 16.4 % — AB (ref 11.5–15.5)
WBC: 9.8 10*3/uL (ref 4.0–10.5)

## 2015-05-17 LAB — COMPREHENSIVE METABOLIC PANEL
ALBUMIN: 4.1 g/dL (ref 3.5–5.0)
ALT: 13 U/L — AB (ref 14–54)
AST: 16 U/L (ref 15–41)
Alkaline Phosphatase: 58 U/L (ref 38–126)
Anion gap: 8 (ref 5–15)
BILIRUBIN TOTAL: 0.4 mg/dL (ref 0.3–1.2)
BUN: 11 mg/dL (ref 6–20)
CHLORIDE: 108 mmol/L (ref 101–111)
CO2: 22 mmol/L (ref 22–32)
CREATININE: 0.71 mg/dL (ref 0.44–1.00)
Calcium: 9.1 mg/dL (ref 8.9–10.3)
GFR calc Af Amer: >60 mL/min (ref >60)
GLUCOSE: 94 mg/dL (ref 65–99)
POTASSIUM: 3.5 mmol/L (ref 3.5–5.1)
Sodium: 138 mmol/L (ref 135–145)
Total Protein: 7.7 g/dL (ref 6.5–8.1)

## 2015-05-17 LAB — LIPASE, BLOOD: LIPASE: 25 U/L (ref 11–51)

## 2015-05-17 MED ORDER — IOHEXOL 300 MG/ML  SOLN: 25.0000 mL | Freq: Once | INTRAMUSCULAR | Status: DC | PRN

## 2015-05-17 MED ORDER — ONDANSETRON HCL 4 MG/2ML IJ SOLN
4.0000 mg | Freq: Once | INTRAMUSCULAR | Status: AC
  Administered 2015-05-17: 4 mg via INTRAVENOUS
  Filled 2015-05-17: qty 2

## 2015-05-17 MED ORDER — OXYCODONE HCL 5 MG PO TABS: 5.0000 mg | ORAL_TABLET | ORAL | Status: DC | PRN

## 2015-05-17 MED ORDER — MORPHINE SULFATE (PF) 4 MG/ML IV SOLN
4.0000 mg | Freq: Once | INTRAVENOUS | Status: AC
  Administered 2015-05-17: 4 mg via INTRAVENOUS
  Filled 2015-05-17: qty 1

## 2015-05-17 MED ORDER — SODIUM CHLORIDE 0.9 % IV BOLUS (SEPSIS)
1000.0000 mL | Freq: Once | INTRAVENOUS | Status: AC
  Administered 2015-05-17: 1000 mL via INTRAVENOUS

## 2015-05-17 MED ORDER — IOHEXOL 300 MG/ML  SOLN
100.0000 mL | Freq: Once | INTRAMUSCULAR | Status: AC | PRN
  Administered 2015-05-17: 100 mL via INTRAVENOUS

## 2015-05-17 MED ORDER — CIPROFLOXACIN HCL 500 MG PO TABS: 500.0000 mg | ORAL_TABLET | Freq: Two times a day (BID) | ORAL | Status: DC

## 2015-05-17 MED ORDER — METRONIDAZOLE 500 MG PO TABS: 500.0000 mg | ORAL_TABLET | Freq: Three times a day (TID) | ORAL | Status: DC

## 2015-05-17 NOTE — ED Notes (Signed)
Abd pain x 3 days, hx of diverticulitis. Generalized abdominal pain, "like the feeling I get when my colon is swollen." C/o nausea and diarrhea.

## 2015-05-17 NOTE — ED Provider Notes (Signed)
CSN: [id number]     Arrival date & time 05/17/15  1359 History   First MD Initiated Contact with Patient 05/17/15 1632     Chief Complaint  Patient presents with  . Abdominal Pain  . Nausea  . Diarrhea   (Consider location/radiation/quality/duration/timing/severity/associated sxs/prior Treatment) HPI 50 y.o. female with a hx of Diverticulitis, Afib on Eliquis, presents to the Emergency Department today complaining of generalized abdominal pain x 3 days. Notes pain is located on both LLQ and RLQ. States pain is 8/10 and cramp/sharp feeling. Tried OTC medication and pain medication without relief. Scheduled for Colonoscopy with Dr. Collene Mares tomorrow due to hx of "large colon." Pt states that she is planning to have a colon resection with Dr. Lucia Gaskins after the colonoscopy due to a perforation. Notes N/V/D. No CP/SOB. No fevers. No other symptoms noted.    Of note, patient was admitted on1-07-17 due to Diverticulitis with perforation. She has abscess drained by IR in hospital. Surgery chose to withhold from surgery at that time and decided to schedule for a later date. DCed on 03-06-15 with follow up for Colonoscopy.   Cardiologist- Dr. Stanford Breed Surgeon- Dr. Lucia Gaskins   Past Medical History  Diagnosis Date  . Diverticulitis 2015  . Atrial fibrillation ([place]) 2015  . Hypertension   . Dysrhythmia   . Anxiety   . Sleep apnea   . Headache    Past Surgical History  Procedure Laterality Date  . Cholecystectomy  2009  . Tubal ligation  198  . Cesarean section  1983; 1986; 1988   Family History  Problem Relation Age of Onset  . Heart attack Mother     ? in her 39's - never sought treatment  . Pulmonary embolism Mother   . Other Father     ? hx  . Lupus Sister   . Heart failure Sister    Social History  Substance Use Topics  . Smoking status: Former Smoker -- 0.33 packs/day for 30 years    Types: Cigarettes    Quit date: 12/31/2013  . Smokeless tobacco: None     Comment: pt smoke electronic  cigarette  . Alcohol Use: 1.8 oz/week    3 Glasses of wine, 0 Standard drinks or equivalent per week   OB History    No data available     Review of Systems ROS reviewed and all are negative for acute change except as noted in the HPI.  Allergies  Benadryl; Codeine; Dilaudid; and Percocet  Home Medications   Prior to Admission medications   Medication Sig Start Date End Date Taking? Authorizing Provider  apixaban (ELIQUIS) 5 MG TABS tablet Take 1 tablet (5 mg total) by mouth 2 (two) times daily. 01/26/15   Lelon Perla, MD  Chlorpheniramine Maleate (CHLOR-TRIMETON PO) Take 2 tablets by mouth daily as needed (allergies).    Historical Provider, MD  ciprofloxacin (CIPRO) 500 MG tablet Take 1 tablet (500 mg total) by mouth 2 (two) times daily. 03/06/15   Robbie Lis, MD  diltiazem (CARDIZEM CD) 180 MG 24 hr capsule Take 1 capsule (180 mg total) by mouth daily. 01/26/15   Lelon Perla, MD  HYDROcodone-acetaminophen (NORCO/VICODIN) 5-325 MG tablet Take 1-2 tablets by mouth every 4 (four) hours as needed for moderate pain. 03/06/15   Robbie Lis, MD  metoprolol (LOPRESSOR) 50 MG tablet Take 1 tablet (50 mg total) by mouth 2 (two) times daily. 01/26/15   Lelon Perla, MD  metroNIDAZOLE (FLAGYL) 500 MG tablet  Take 1 tablet (500 mg total) by mouth 3 (three) times daily. 03/06/15   Robbie Lis, MD  ondansetron (ZOFRAN) 4 MG tablet Take 1 tablet (4 mg total) by mouth every 8 (eight) hours as needed for nausea or vomiting. 03/06/15   Robbie Lis, MD  zolpidem (AMBIEN) 5 MG tablet Take 1 tablet (5 mg total) by mouth at bedtime as needed for sleep. 03/06/15   Robbie Lis, MD   BP 146/63 mmHg  Pulse 80  Temp(Src) 98.2 F (36.8 C) (Oral)  Resp 16  SpO2 98%   Physical Exam  Constitutional: She is oriented to person, place, and time. She appears well-developed and well-nourished.  HENT:  Head: Normocephalic and atraumatic.  Eyes: EOM are normal. Pupils are equal, round, and  reactive to light.  Neck: Normal range of motion. Neck supple. No tracheal deviation present.  Cardiovascular: Normal rate, regular rhythm and normal heart sounds.   Pulmonary/Chest: Effort normal. No respiratory distress. She has no wheezes. She has no rales. She exhibits no tenderness.  Abdominal: Soft. Normal appearance and bowel sounds are normal. There is generalized tenderness. There is no rigidity, no rebound, no guarding, no CVA tenderness, no tenderness at McBurney's point and negative Murphy's sign.  Musculoskeletal: Normal range of motion.  Neurological: She is alert and oriented to person, place, and time.  Skin: Skin is warm and dry.  Psychiatric: She has a normal mood and affect. Her behavior is normal. Thought content normal.  Nursing note and vitals reviewed.  ED Course  Procedures (including critical care time) Labs Review Labs Reviewed  COMPREHENSIVE METABOLIC PANEL - Abnormal; Notable for the following:    ALT 13 (*)    All other components within normal limits  CBC - Abnormal; Notable for the following:    Hemoglobin 10.8 (*)    HCT 33.1 (*)    MCV 72.9 (*)    MCH 23.8 (*)    RDW 16.4 (*)    All other components within normal limits  URINALYSIS, ROUTINE W REFLEX MICROSCOPIC (NOT AT [facility]) - Abnormal; Notable for the following:    APPearance HAZY (*)    All other components within normal limits  LIPASE, BLOOD   Imaging Review Ct Abdomen Pelvis W Contrast  05/17/2015  CLINICAL DATA:  History of diverticulitis. Generalized abdominal pain for 3 days. Nausea and diarrhea EXAM: CT ABDOMEN AND PELVIS WITH CONTRAST TECHNIQUE: Multidetector CT imaging of the abdomen and pelvis was performed using the standard protocol following bolus administration of intravenous contrast. Oral contrast was also administered. CONTRAST:  117mL OMNIPAQUE IOHEXOL 300 MG/ML  SOLN COMPARISON:  March 01, 2015 FINDINGS: Lower chest: There is bibasilar lung atelectasis. Lung bases otherwise are  clear. Hepatobiliary: There are stable scattered foci of decreased attenuation throughout the liver measuring 2-3 mm, probably representing either small cysts or hamartomas. There is a 7 x 6 mm cyst in the posterior segment right lobe of the liver. There is a 1.0 x 0.8 cm cyst in the right lobe of the liver at the level of the hepatorenal fossa. No new liver lesions are evident. Gallbladder is absent. There is no biliary duct dilatation. Pancreas: No pancreatic mass or inflammatory focus. Spleen: No splenic lesions are evident. Adrenals/Urinary Tract: Adrenals appear normal bilaterally. Kidneys bilaterally show no appreciable mass or hydronephrosis on either side. There is no renal or ureteral calculus on either side. The urinary bladder is midline with wall thickness within normal limits. Stomach/Bowel: There is diverticulosis throughout the  sigmoid colon. In the mid sigmoid colon, located slightly to the right of midline, there is mesenteric inflammation as well as mild wall thickening consistent with a degree of diverticulitis. There is no demonstrable abscess or perforation in the sigmoid region. Elsewhere, there is no bowel wall or mesenteric thickening. There are scattered diverticula throughout the right colon and transverse colon without diverticulitis. There is no free air or portal venous air. No bowel obstruction. Vascular/Lymphatic: There are scattered foci of atherosclerotic calcification in the aorta. There is no abdominal aortic aneurysm. There is no major mesenteric vessel narrowing or obstruction. There is no adenopathy in the abdomen or pelvis. Reproductive: Uterus is anteverted. There is no pelvic mass or pelvic fluid collection. Other: The appendix appears normal. There is no abscess or ascites in the abdomen or pelvis. There is a small ventral hernia containing only fat. Musculoskeletal: There are no blastic or lytic bone lesions. No abdominal wall lesion evident. There is symmetric fatty  infiltration in each tensor fascia lata muscle. IMPRESSION: Sigmoid diverticulitis, mid to distal sigmoid, primarily to the right of midline, without abscess or perforation. There is extensive colonic diverticulosis elsewhere without diverticulitis. No bowel obstruction. Elsewhere no abscess or ascites in the abdomen or pelvis. Appendix appears normal. No renal or ureteral calculus.  No hydronephrosis. Gallbladder absent. Small ventral hernia containing only fat. Electronically Signed   By: Lowella Grip III M.D.   On: 05/17/2015 18:58   I have personally reviewed and evaluated these images and lab results as part of my medical decision-making.   EKG Interpretation None      MDM  I have reviewed and evaluated the relevant laboratory values. I have reviewed and evaluated the relevant imaging studies. I have reviewed the relevant previous healthcare records. I obtained HPI from historian. Patient discussed with supervising physician  ED Course:  Assessment: Pt is a 33yF with hx diverticulitis who presents with abdomina pain x3days. On exam, pt in NAD. Nontoxic/nonseptic appearing. VSS. Afebrile. Lungs CTA. Heart RRR. Abdomen TTP RLQ/LLQ. Labs unremarkable. CT ABD/Pelvis showed sigmoid diverticulitis without abscess or perforation. No other acute abnormalities. Given Analgesia, Zofran, Fluids in ED. Plan is to [place] with follow up to GI doctor at scheduled appointment tomorrow morning for further evaluation. Will likely need further intervention with planned resection for symptomatic relief from chronic abdominal pain. Pt agrees with plan and discharge. At time of discharge, Patient is in no acute distress. Vital Signs are stable. Patient is able to ambulate. Patient able to tolerate PO.    Disposition/Plan:  DC Home Additional Verbal discharge instructions given and discussed with patient.  Pt Instructed to f/u with GI for evaluation and treatment of symptoms. Return precautions given Pt  acknowledges and agrees with plan  Supervising Physician Forde Dandy, MD   Final diagnoses:  Sigmoid diverticulitis      Shary Decamp, PA-C 05/17/15 1924  Forde Dandy, MD 05/18/15 [id number]

## 2015-05-17 NOTE — ED Notes (Signed)
Patient transported to CT 

## 2015-05-17 NOTE — Discharge Instructions (Signed)
Please read and follow all provided instructions.  Your diagnoses today include:  1. Sigmoid diverticulitis    Tests performed today include:  Blood counts and electrolytes  Blood tests to check liver and kidney function  Blood tests to check pancreas function  Urine test to look for infection and pregnancy (in women)  Vital signs. See below for your results today.   Medications prescribed:   Take any prescribed medications only as directed.  Home care instructions:   Follow any educational materials contained in this packet.  Follow-up instructions: Please follow-up with your GI doctor tomorrow for further evaluation of your symptoms.    Return instructions:  SEEK IMMEDIATE MEDICAL ATTENTION IF:  The pain does not go away or becomes severe   A temperature above 101F develops   Repeated vomiting occurs (multiple episodes)   The pain becomes localized to portions of the abdomen. The right side could possibly be appendicitis. In an adult, the left lower portion of the abdomen could be colitis or diverticulitis.   Blood is being passed in stools or vomit (bright red or black tarry stools)   You develop chest pain, difficulty breathing, dizziness or fainting, or become confused, poorly responsive, or inconsolable (young children)  If you have any other emergent concerns regarding your health  Additional Information: Abdominal (belly) pain can be caused by many things. Your caregiver performed an examination and possibly ordered blood/urine tests and imaging (CT scan, x-rays, ultrasound). Many cases can be observed and treated at home after initial evaluation in the emergency department. Even though you are being discharged home, abdominal pain can be unpredictable. Therefore, you need a repeated exam if your pain does not resolve, returns, or worsens. Most patients with abdominal pain don't have to be admitted to the hospital or have surgery, but serious problems like  appendicitis and gallbladder attacks can start out as nonspecific pain. Many abdominal conditions cannot be diagnosed in one visit, so follow-up evaluations are very important.  Your vital signs today were: BP 133/80 mmHg   Pulse 79   Temp(Src) 98.2 F (36.8 C) (Oral)   Resp 20   SpO2 100% If your blood pressure (bp) was elevated above 135/85 this visit, please have this repeated by your doctor within one month. --------------

## 2015-05-18 ENCOUNTER — Telehealth: Payer: Self-pay | Admitting: *Deleted

## 2015-05-18 NOTE — Telephone Encounter (Signed)
Office note containing clearance for colonoscopy and directions for eliquis faxed to the number provided

## 2015-05-19 MED ORDER — CIPROFLOXACIN HCL 500 MG PO TABS: 500.0000 mg | ORAL_TABLET | Freq: Two times a day (BID) | ORAL | Status: DC

## 2015-05-19 MED ORDER — METRONIDAZOLE 500 MG PO TABS: 500.0000 mg | ORAL_TABLET | Freq: Three times a day (TID) | ORAL | Status: DC

## 2015-05-19 MED ORDER — OXYCODONE HCL 5 MG PO TABS: 5.0000 mg | ORAL_TABLET | ORAL | Status: DC | PRN

## 2015-06-10 ENCOUNTER — Other Ambulatory Visit: Payer: Self-pay | Admitting: Surgery

## 2015-06-10 ENCOUNTER — Other Ambulatory Visit (HOSPITAL_COMMUNITY): Payer: Self-pay | Admitting: Surgery

## 2015-06-10 NOTE — Progress Notes (Signed)
Please place surgical orders in epic. Pt has preop appt for Friday 06/11/2015. Thanks.

## 2015-06-10 NOTE — Progress Notes (Signed)
LOV Dr Stanford Breed  With clearance and EKG 3/17 epic eccho 11/15 epic Stress test 2015 epic Sleep study 4/16 epic

## 2015-06-11 ENCOUNTER — Encounter (HOSPITAL_COMMUNITY)
Admission: RE | Admit: 2015-06-11 | Discharge: 2015-06-11 | Disposition: A | Discharge status: Home / Self Care | Payer: Commercial Managed Care - PPO | Source: Ambulatory Visit | Attending: Surgery | Admitting: Surgery

## 2015-06-11 ENCOUNTER — Encounter (HOSPITAL_COMMUNITY): Payer: Self-pay

## 2015-06-11 DIAGNOSIS — Z01812 Encounter for preprocedural laboratory examination: Secondary | ICD-10-CM | POA: Diagnosis present

## 2015-06-11 DIAGNOSIS — K5732 Diverticulitis of large intestine without perforation or abscess without bleeding: Secondary | ICD-10-CM | POA: Diagnosis present

## 2015-06-11 LAB — CBC WITH DIFFERENTIAL/PLATELET
BASOS PCT: 0 %
Basophils Absolute: 0 10*3/uL (ref 0.0–0.1)
EOS ABS: 0.3 10*3/uL (ref 0.0–0.7)
Eosinophils Relative: 4 %
HCT: 33.1 % — ABNORMAL LOW (ref 36.0–46.0)
HEMOGLOBIN: 10.7 g/dL — AB (ref 12.0–15.0)
Lymphocytes Relative: 27 %
Lymphs Abs: 2.1 10*3/uL (ref 0.7–4.0)
MCH: 23.8 pg — ABNORMAL LOW (ref 26.0–34.0)
MCHC: 32.3 g/dL (ref 30.0–36.0)
MCV: 73.7 fL — ABNORMAL LOW (ref 78.0–100.0)
MONO ABS: 0.7 10*3/uL (ref 0.1–1.0)
MONOS PCT: 9 %
NEUTROS PCT: 60 %
Neutro Abs: 4.7 10*3/uL (ref 1.7–7.7)
Platelets: 426 10*3/uL — ABNORMAL HIGH (ref 150–400)
RBC: 4.49 MIL/uL (ref 3.87–5.11)
RDW: 16.4 % — AB (ref 11.5–15.5)
WBC: 7.8 10*3/uL (ref 4.0–10.5)

## 2015-06-11 LAB — COMPREHENSIVE METABOLIC PANEL
ALT: 11 U/L — ABNORMAL LOW (ref 14–54)
ANION GAP: 8 (ref 5–15)
AST: 19 U/L (ref 15–41)
Albumin: 3.9 g/dL (ref 3.5–5.0)
Alkaline Phosphatase: 54 U/L (ref 38–126)
BUN: 11 mg/dL (ref 6–20)
CHLORIDE: 109 mmol/L (ref 101–111)
CO2: 24 mmol/L (ref 22–32)
Calcium: 9.3 mg/dL (ref 8.9–10.3)
Creatinine, Ser: 0.63 mg/dL (ref 0.44–1.00)
GFR calc Af Amer: >60 mL/min (ref >60)
GFR calc non Af Amer: >60 mL/min (ref >60)
GLUCOSE: 86 mg/dL (ref 65–99)
POTASSIUM: 4.4 mmol/L (ref 3.5–5.1)
SODIUM: 141 mmol/L (ref 135–145)
Total Bilirubin: 0.5 mg/dL (ref 0.3–1.2)
Total Protein: 7.3 g/dL (ref 6.5–8.1)

## 2015-06-11 LAB — SURGICAL PCR SCREEN
MRSA, PCR: NEGATIVE
STAPHYLOCOCCUS AUREUS: POSITIVE — AB

## 2015-06-11 LAB — NO BLOOD PRODUCTS

## 2015-06-11 NOTE — Progress Notes (Signed)
CbC done 06/11/15 faxed via EPIC to Dr Alphonsa Overall.

## 2015-06-11 NOTE — Progress Notes (Signed)
Blood Refusal note in FYI.   Blood Refusal documented in allergies  Blood Refusal documented in MD orders.  Blood Refusal Consent Form signed by patient faxed to surgeon with confirmation and also note routed in EPIC to surgeon.   Blood Refusal Consent form faxed to Blood [facility] with confirmation.

## 2015-06-11 NOTE — Progress Notes (Signed)
EKG- 05/11/15- [facility]  12/2013- [place]  01/26/15- LOV- Cardiology- [facility]

## 2015-06-11 NOTE — Progress Notes (Signed)
Patient has signed Blood Refusal consent form.  I have faxed to [phone].  Consent form on front of chart.  FYI.

## 2015-06-11 NOTE — Patient Instructions (Signed)
Crystal Brennan  06/11/2015   Your procedure is scheduled on: 06/15/2015    Report to [facility] Main  Entrance take [place]  elevators to 3rd floor to  [place] at    [place] AM.  Call this number if you have problems the morning of surgery [phone]   Remember: ONLY 1 PERSON MAY GO WITH YOU TO SHORT STAY TO GET  READY MORNING OF YOUR SURGERY.  Do not eat food or drink liquids :After Midnight.  Follow Bowel Prep instructions per MD    Take these medicines the morning of surgery with A SIP OF WATER: Diltiazem ( Cardiazem), Hydrocodone or Oxycodone if needed, Claritin if needed, Metoprolol ( Lopressor)                                 You may not have any metal on your body including hair pins and              piercings  Do not wear jewelry, make-up, lotions, powders or perfumes, deodorant             Do not wear nail polish.  Do not shave  48 hours prior to surgery.                Do not bring valuables to the hospital. [address].  Contacts, dentures or bridgework may not be worn into surgery.  Leave suitcase in the car. After surgery it may be brought to your room.     Marland Kitchen  Special Instructions: coughing and deep breathing exercises, leg exercises               Please read over the following fact sheets you were given: _____________________________________________________________________             [facility] - Preparing for Surgery Before surgery, you can play an important role.  Because skin is not sterile, your skin needs to be as free of germs as possible.  You can reduce the number of germs on your skin by washing with CHG (chlorahexidine gluconate) soap before surgery.  CHG is an antiseptic cleaner which kills germs and bonds with the skin to continue killing germs even after washing. Please DO NOT use if you have an allergy to CHG or antibacterial soaps.  If your skin becomes reddened/irritated  stop using the CHG and inform your nurse when you arrive at Short Stay. Do not shave (including legs and underarms) for at least 48 hours prior to the first CHG shower.  You may shave your face/neck. Please follow these instructions carefully:  1.  Shower with CHG Soap the night before surgery and the  morning of Surgery.  2.  If you choose to wash your hair, wash your hair first as usual with your  normal  shampoo.  3.  After you shampoo, rinse your hair and body thoroughly to remove the  shampoo.                           4.  Use CHG as you would any other liquid soap.  You can apply chg directly  to the skin and wash  Gently with a scrungie or clean washcloth.  5.  Apply the CHG Soap to your body ONLY FROM THE NECK DOWN.   Do not use on face/ open                           Wound or open sores. Avoid contact with eyes, ears mouth and genitals (private parts).                       Wash face,  Genitals (private parts) with your normal soap.             6.  Wash thoroughly, paying special attention to the area where your surgery  will be performed.  7.  Thoroughly rinse your body with warm water from the neck down.  8.  DO NOT shower/wash with your normal soap after using and rinsing off  the CHG Soap.                9.  Pat yourself dry with a clean towel.            10.  Wear clean pajamas.            11.  Place clean sheets on your bed the night of your first shower and do not  sleep with pets. Day of Surgery : Do not apply any lotions/deodorants the morning of surgery.  Please wear clean clothes to the hospital/surgery center.  FAILURE TO FOLLOW THESE INSTRUCTIONS MAY RESULT IN THE CANCELLATION OF YOUR SURGERY PATIENT SIGNATURE_________________________________  NURSE SIGNATURE__________________________________  ________________________________________________________________________  WHAT IS A BLOOD TRANSFUSION? Blood Transfusion Information  A transfusion is the  replacement of blood or some of its parts. Blood is made up of multiple cells which provide different functions.  Red blood cells carry oxygen and are used for blood loss replacement.  White blood cells fight against infection.  Platelets control bleeding.  Plasma helps clot blood.  Other blood products are available for specialized needs, such as hemophilia or other clotting disorders. BEFORE THE TRANSFUSION  Who gives blood for transfusions?   Healthy volunteers who are fully evaluated to make sure their blood is safe. This is blood bank blood. Transfusion therapy is the safest it has ever been in the practice of medicine. Before blood is taken from a donor, a complete history is taken to make sure that person has no history of diseases nor engages in risky social behavior (examples are intravenous drug use or sexual activity with multiple partners). The donor's travel history is screened to minimize risk of transmitting infections, such as malaria. The donated blood is tested for signs of infectious diseases, such as HIV and hepatitis. The blood is then tested to be sure it is compatible with you in order to minimize the chance of a transfusion reaction. If you or a relative donates blood, this is often done in anticipation of surgery and is not appropriate for emergency situations. It takes many days to process the donated blood. RISKS AND COMPLICATIONS Although transfusion therapy is very safe and saves many lives, the main dangers of transfusion include:   Getting an infectious disease.  Developing a transfusion reaction. This is an allergic reaction to something in the blood you were given. Every precaution is taken to prevent this. The decision to have a blood transfusion has been considered carefully by your caregiver before blood is given. Blood is not given unless the benefits outweigh  the risks. AFTER THE TRANSFUSION  Right after receiving a blood transfusion, you will usually  feel much better and more energetic. This is especially true if your red blood cells have gotten low (anemic). The transfusion raises the level of the red blood cells which carry oxygen, and this usually causes an energy increase.  The nurse administering the transfusion will monitor you carefully for complications. HOME CARE INSTRUCTIONS  No special instructions are needed after a transfusion. You may find your energy is better. Speak with your caregiver about any limitations on activity for underlying diseases you may have. SEEK MEDICAL CARE IF:   Your condition is not improving after your transfusion.  You develop redness or irritation at the intravenous (IV) site. SEEK IMMEDIATE MEDICAL CARE IF:  Any of the following symptoms occur over the next 12 hours:  Shaking chills.  You have a temperature by mouth above 102 F (38.9 C), not controlled by medicine.  Chest, back, or muscle pain.  People around you feel you are not acting correctly or are confused.  Shortness of breath or difficulty breathing.  Dizziness and fainting.  You get a rash or develop hives.  You have a decrease in urine output.  Your urine turns a dark color or changes to pink, red, or brown. Any of the following symptoms occur over the next 10 days:  You have a temperature by mouth above 102 F (38.9 C), not controlled by medicine.  Shortness of breath.  Weakness after normal activity.  The white part of the eye turns yellow (jaundice).  You have a decrease in the amount of urine or are urinating less often.  Your urine turns a dark color or changes to pink, red, or brown. Document Released: 01/28/2000 Document Revised: 04/24/2011 Document Reviewed: 09/16/2007 [facility] Patient Information 2014 [place], [place].  _______________________________________________________________________

## 2015-06-11 NOTE — Progress Notes (Signed)
Patient has CPAP mask and tubing in storage per patient.  She stated that before when she was in hospital the hospital provided her with mask and tubing. ( This was noted on Respiratory Sheet - preop ).

## 2015-06-12 LAB — HEMOGLOBIN A1C
Hgb A1c MFr Bld: 5.9 % — ABNORMAL HIGH (ref 4.8–5.6)
Mean Plasma Glucose: 123 mg/dL

## 2015-06-14 ENCOUNTER — Other Ambulatory Visit: Payer: Self-pay | Admitting: Surgery

## 2015-06-15 NOTE — Progress Notes (Signed)
Patient surgery cancelled on 06/15/2015 and changed to 06/29/2015.  Patient called with new date and time of surgery along with arrival time. Patient voiced understanding.  Patient still has preop instructions from 06/11/15 appointment.  Will follow those for surgery on 06/29/2015.  New stickers placed on chart.  Since surgery date has been moved patient can do Mupirocin treatment for positive PCR screen on 06/11/2015.  Spoke with patient and made her aware that she is now able to do Mupirocin treatment and instructed over phone.  Called in script to [facility] on [address] in [place] , [place] and patient aware to pick upscript on 06/15/15 or 06/16/2015.  Patient still has bowel prep instructions and is aware of what to do with Eliquis.

## 2015-06-28 NOTE — H&P (Signed)
Crystal Brennan  Location: [facility] Surgery Patient #: [id number] DOB: [birth year] Married / Language: English / Race: Black or African American Female  History of Present Illness   The patient is a 50 year old female who presents with a complaint of follow up for diverticulitis.  She just got a PCP [facility] - she saw a PA, but cannot remember the name.  Cardiology - Dr. Stanford Breed  GI - Dr. Collene Mares     She is one of the patients on the LDOW that I saw once on admission for diverticulitis. She was hospitalized at [facility] long from 20 February 2015 to 06 March 2015.  During that admission she had a percutaneous drain of a diverticular abscess which was removed prior to her discharge. She saw Dr. Zella Richer on 03/24/2015, but for some reason he sent her back to me.  This was her fourth bout of diverticulitis in 3 years. She had 2 attacks in 2015. She was hospitalized from the 23rd to 07 October 2013 for diverticulitis. It does not look like we were involved in her care during that hospitalization. She had another attack in 2016, postop hospitalized at that time. In that her most recent hospitalization in January 2017.  Since discharge, she has had some nausea and sharp pain in her lower abdomen. She's had no fever. She's avoided fiber.   Her other problem is that she has some back pain for she's been prescribed muscle relaxants by the physician assistant at [facility].  She had 3 c sections. She has a left ovarian cyst removed when she was 50 yo. Dr. Hulan Fray has seen her for a right ovarian cyst, but this was not seen on her most recent CT scan.   I reviewed with the patient the findings and need for colon surgery. I discussed both surgical and non surgical options. I discussed the role of laparoscopic and open surgery in colon surgery. I reviewed the risks of surgery, including, but not limited to, infection, bleeding, nerve injury, anastomotic  leaks, and possibility of colostomy. I went over the preoperative mechanical and antibiotic bowel prep for colon surgery and provided prescriptions for these. I provided the patient literature on colon surgery. I tried to answer all questions for the patient.  Plan: 1) Cardiac clearance from Dr. Stanford Breed - also, when to stop Eliquis, 2) Colonoscopy, Dr. Collene Mares 3) Then schedule sigmoid colectomy  Past Medical History: 1. History of A. Fib Followed by Dr. Thresa Ross. She is not in A fib at this time 2. Anticoagulated on Eliquis 3. HTN 4. Morbid obesity - BMI 46.8 5. Quit smoking in November 2016. 6. OSA - seen by Dr. Elsworth Soho in 05/21/2014 7. History of depression/anxiety 8. Questionable ovarian cyst followed by Dr. Hulan Fray, but she has not had follow up with Dr. Hulan Fray. 9. History of perirectal abscess - [facility] did I&D on 08/2008. 10. Lap chole Lucia Gaskins - 12/2007 11.  She is studying to become a Jehovah's witness and does not want blood products during surgery. She had a Hgb of 10.8 on 05/17/2015.   She realizes that blood loss is a risk of major surgery and significant blood loss without transfusion could be life threatening. I will honor her religious requests. There is a form she can sign at the hospital. 12. Note: Her disposition is one of confrontation.  SOCIAL and FAMILY HISTORY: Just remarried in Nov 2016 - Irvin. He is on house arrest. In fact he has to turn himself in  soon. So he cannot come with her. She has 3 children: 33, 30 and 28. She works for Therapist, art for [employer], [employer]. Her sister is a Marine scientist. On her next visit, her mother or sister will come with her.  Allergies Elbert Ewings, CMA; 04/28/2015 9:28 AM) Benadryl *ANTIHISTAMINES* Codeine Phosphate *ANALGESICS - OPIOID* Percocet *ANALGESICS - OPIOID* Dilaudid *ANALGESICS - OPIOID*  Medication History Elbert Ewings, CMA; 04/28/2015 9:28 AM) Zofran (4MG  Tablet, 1 (one) Tablet  Oral every six hours, as needed, Taken starting 03/24/2015) Active. Eliquis (5MG  Tablet, Oral) Active. Chlorpheniramine Maleate (4MG  Tablet, Oral) Active. DiltiaZEM CD (180MG  Capsule ER 24HR, Oral) Active. Metoprolol Tartrate (50MG  Tablet, Oral) Active. Cipro (500MG  Tablet, Oral) Active. Norco (5-325MG  Tablet, Oral) Active. Flagyl (500MG  Tablet, Oral) Active. Ambien (5MG  Tablet, Oral) Active. Medications Reconciled  Vitals Elbert Ewings CMA; 04/28/2015 9:29 AM) 04/28/2015 9:29 AM Weight: 245 lb Height: 61in Body Surface Area: 2.06 m Body Mass Index: 46.29 kg/m  Temp.: 97.85F(Temporal)  Pulse: 126 (Irregular)  BP: 130/78 (Sitting, Left Arm, Standard)  Physical Exam  General: Obese AA F alert and generally healthy appearing. HEENT: Normal. Pupils equal.  Neck: Supple. No mass. No thyroid mass. Lymph Nodes: No supraclavicular or cervical nodes.  Lungs: Clear to auscultation and symmetric breath sounds. Heart: RRR. No murmur or rub.  Abdomen: Soft. No mass. No tenderness. No hernia. Normal bowel sounds.  She has no localized tenderness or mass. Rectal: Not done.  Extremities: Good strength and ROM in upper and lower extremities.  Neurologic: Grossly intact to motor and sensory function. Psychiatric: Has normal mood and affect. Behavior is normal.  Assessment & Plan  1.  DIVERTICULITIS OF LARGE INTESTINE WITH ABSCESS WITHOUT BLEEDING (K57.20)  Plan:   1)   This was not done, because we could never get her symptom free.   2) Cardiac clearance by Dr. Stanford Breed    And when to stop anti-coagulation   3) Set up date for surgery and see me with family about one week before surgery to give pre op prep   Addendum Note(Madylin Fairbank H. Lucia Gaskins MD; 05/18/2015 11:25 AM)   Dr. Collene Mares called me.   Apparently Crystal Brennan has had another flair of up diverticulitis. Dr. Collene Mares is going to try to treat her for one month and then scope her. I agree with Dr. Collene Mares that there is too much  risk to scope her with active diverticulitis. If we cannot get Crystal Brennan disease to calm down enough, we may need to just go ahead with surgery. Dr. Collene Mares was going to tell her to see me in about 6 weeks.  Addendum Note(Konica Stankowski H. Lucia Gaskins MD; 06/04/2015 1:23 PM)   Crystal Brennan has called the office several times with continued abdominal symptoms. She does not think that she can wait until her colonoscopy to consider surgery.   She is trying to do all the right things eating, taking antibiotics.   She wants to go ahead with surgery. I talked about the advantage of colonoscopy first - particularly finding an undiagnosed tumor, but she wants to go ahead with surgery.   I went over with her:   1) Stop the xarelto 3 days before surgery   2) Mechanical and antibiotics bowel prep   3) I discussed the risk of colostomy   4) She'll still need a colonoscopy post op   Addendum Note(Jayce Kainz H. Lucia Gaskins MD; 06/15/2015 12:42 PM)   Surgery canceled because of taking the xarelto too late.   Surgery rescheduled for 06/29/2015.  I spoke to the patient on the phone and plan:    1) She will continued Cipro 500 BID and Flagyl 500 BID until surgery,    2) Her last Eliquis will be 06/24/2015     3) She has her bowel prep. Since she is on chronic Flagyl, will take only Neomycin as a pre op antibiotic the day before surgery. Though she will continue Cipro/Flagyl until surgery  2. History of A. Fib  Followed by Dr. Thresa Ross. She is not in A fib at this time  Addendum Note(Brylinn Teaney H. Lucia Gaskins MD; 04/29/2015 9:03 PM)   Clearance from Dr. Stanford Breed. He wants to hold apixaban for 3 days. 3. Anticoagulated on Eliquis 4. HTN 5. Morbid obesity - BMI 46.8 6. Quit smoking in November 2016. 7. OSA - seen by Dr. Elsworth Soho in 05/21/2014 8. History of depression/anxiety 9. Questionable ovarian cyst followed by Dr. Hulan Fray, but she has not had follow up with Dr. Hulan Fray. Bowen witness - refuses blood  transfusion   Alphonsa Overall, MD, [facility] Surgery Pager: [phone] Office phone:  [phone]

## 2015-06-29 ENCOUNTER — Encounter (HOSPITAL_COMMUNITY)
Admission: RE | Disposition: A | Discharge status: Home / Self Care | Payer: Self-pay | Source: Ambulatory Visit | Attending: Surgery

## 2015-06-29 ENCOUNTER — Inpatient Hospital Stay (HOSPITAL_COMMUNITY)
Admission: RE | Admit: 2015-06-29 | Discharge: 2015-07-05 | DRG: 330 | Disposition: A | Discharge status: Home / Self Care | Payer: Commercial Managed Care - PPO | Source: Ambulatory Visit | Attending: Surgery | Admitting: Surgery

## 2015-06-29 ENCOUNTER — Inpatient Hospital Stay (HOSPITAL_COMMUNITY): Payer: Commercial Managed Care - PPO | Admitting: Certified Registered"

## 2015-06-29 ENCOUNTER — Encounter (HOSPITAL_COMMUNITY): Payer: Self-pay | Admitting: *Deleted

## 2015-06-29 DIAGNOSIS — Z87891 Personal history of nicotine dependence: Secondary | ICD-10-CM

## 2015-06-29 DIAGNOSIS — K5720 Diverticulitis of large intestine with perforation and abscess without bleeding: Principal | ICD-10-CM | POA: Diagnosis present

## 2015-06-29 DIAGNOSIS — Z7901 Long term (current) use of anticoagulants: Secondary | ICD-10-CM | POA: Diagnosis not present

## 2015-06-29 DIAGNOSIS — K5732 Diverticulitis of large intestine without perforation or abscess without bleeding: Secondary | ICD-10-CM | POA: Diagnosis present

## 2015-06-29 DIAGNOSIS — I10 Essential (primary) hypertension: Secondary | ICD-10-CM | POA: Diagnosis present

## 2015-06-29 DIAGNOSIS — G4733 Obstructive sleep apnea (adult) (pediatric): Secondary | ICD-10-CM | POA: Diagnosis present

## 2015-06-29 DIAGNOSIS — Z531 Procedure and treatment not carried out because of patient's decision for reasons of belief and group pressure: Secondary | ICD-10-CM | POA: Diagnosis present

## 2015-06-29 DIAGNOSIS — F329 Major depressive disorder, single episode, unspecified: Secondary | ICD-10-CM | POA: Diagnosis present

## 2015-06-29 DIAGNOSIS — D500 Iron deficiency anemia secondary to blood loss (chronic): Secondary | ICD-10-CM | POA: Diagnosis present

## 2015-06-29 DIAGNOSIS — K660 Peritoneal adhesions (postprocedural) (postinfection): Secondary | ICD-10-CM | POA: Diagnosis present

## 2015-06-29 DIAGNOSIS — F419 Anxiety disorder, unspecified: Secondary | ICD-10-CM | POA: Diagnosis present

## 2015-06-29 DIAGNOSIS — K632 Fistula of intestine: Secondary | ICD-10-CM | POA: Diagnosis present

## 2015-06-29 DIAGNOSIS — D62 Acute posthemorrhagic anemia: Secondary | ICD-10-CM | POA: Diagnosis present

## 2015-06-29 DIAGNOSIS — Z6841 Body Mass Index (BMI) 40.0 and over, adult: Secondary | ICD-10-CM

## 2015-06-29 HISTORY — PX: COLON RESECTION: SHX5231

## 2015-06-29 LAB — COMPREHENSIVE METABOLIC PANEL
ALBUMIN: 3.6 g/dL (ref 3.5–5.0)
ALK PHOS: 49 U/L (ref 38–126)
ALT: 14 U/L (ref 14–54)
AST: 21 U/L (ref 15–41)
Anion gap: 7 (ref 5–15)
BUN: 6 mg/dL (ref 6–20)
CALCIUM: 8.7 mg/dL — AB (ref 8.9–10.3)
CHLORIDE: 110 mmol/L (ref 101–111)
CO2: 19 mmol/L — AB (ref 22–32)
CREATININE: 0.56 mg/dL (ref 0.44–1.00)
GFR calc non Af Amer: >60 mL/min (ref >60)
GLUCOSE: 74 mg/dL (ref 65–99)
Potassium: 4.4 mmol/L (ref 3.5–5.1)
SODIUM: 136 mmol/L (ref 135–145)
Total Bilirubin: 0.7 mg/dL (ref 0.3–1.2)
Total Protein: 7 g/dL (ref 6.5–8.1)

## 2015-06-29 LAB — CBC WITH DIFFERENTIAL/PLATELET
BASOS ABS: 0 10*3/uL (ref 0.0–0.1)
Basophils Relative: 0 %
EOS PCT: 3 %
Eosinophils Absolute: 0.2 10*3/uL (ref 0.0–0.7)
HEMATOCRIT: 34.4 % — AB (ref 36.0–46.0)
HEMOGLOBIN: 11 g/dL — AB (ref 12.0–15.0)
LYMPHS PCT: 23 %
Lymphs Abs: 2 10*3/uL (ref 0.7–4.0)
MCH: 23.2 pg — ABNORMAL LOW (ref 26.0–34.0)
MCHC: 32 g/dL (ref 30.0–36.0)
MCV: 72.6 fL — AB (ref 78.0–100.0)
Monocytes Absolute: 0.7 10*3/uL (ref 0.1–1.0)
Monocytes Relative: 8 %
NEUTROS ABS: 5.5 10*3/uL (ref 1.7–7.7)
NEUTROS PCT: 66 %
PLATELETS: 412 10*3/uL — AB (ref 150–400)
RBC: 4.74 MIL/uL (ref 3.87–5.11)
RDW: 16.5 % — ABNORMAL HIGH (ref 11.5–15.5)
WBC: 8.4 10*3/uL (ref 4.0–10.5)

## 2015-06-29 LAB — PROTIME-INR
INR: 1.14 (ref 0.00–1.49)
Prothrombin Time: 14.8 seconds (ref 11.6–15.2)

## 2015-06-29 SURGERY — COLON RESECTION LAPAROSCOPIC
Anesthesia: General

## 2015-06-29 MED ORDER — MORPHINE SULFATE 2 MG/ML IV SOLN
INTRAVENOUS | Status: DC
  Administered 2015-06-29: 13.5 mg via INTRAVENOUS
  Administered 2015-06-29: 20:00:00 via INTRAVENOUS
  Administered 2015-06-30: 9 mg via INTRAVENOUS
  Administered 2015-06-30: 15 mg via INTRAVENOUS
  Administered 2015-06-30: 10.5 mg via INTRAVENOUS
  Administered 2015-06-30: 13.2 mg via INTRAVENOUS
  Administered 2015-06-30: 10:00:00 via INTRAVENOUS
  Administered 2015-06-30: 4.5 mg via INTRAVENOUS
  Administered 2015-07-01: 6 mg via INTRAVENOUS
  Administered 2015-07-01: 12 mg via INTRAVENOUS
  Administered 2015-07-01: 1.5 mg via INTRAVENOUS
  Administered 2015-07-01: 6 mg via INTRAVENOUS
  Administered 2015-07-01: 13.5 mg via INTRAVENOUS
  Administered 2015-07-01: 9 mg via INTRAVENOUS
  Administered 2015-07-02: 4.5 mg via INTRAVENOUS
  Administered 2015-07-02: 1.5 mg via INTRAVENOUS
  Administered 2015-07-02: 3 mg via INTRAVENOUS
  Administered 2015-07-02 (×2): 10.5 mg via INTRAVENOUS
  Administered 2015-07-02: 18 mg via INTRAVENOUS
  Administered 2015-07-02: 15:00:00 via INTRAVENOUS
  Administered 2015-07-03: 1.8 mg via INTRAVENOUS
  Administered 2015-07-03: 1.5 mg via INTRAVENOUS
  Administered 2015-07-03: 6 mg via INTRAVENOUS
  Administered 2015-07-03 (×2): 1.5 mg via INTRAVENOUS
  Administered 2015-07-04: 6 mg via INTRAVENOUS
  Administered 2015-07-04: 08:00:00 via INTRAVENOUS
  Administered 2015-07-04: 0.9 mg via INTRAVENOUS
  Filled 2015-06-29 (×4): qty 25

## 2015-06-29 MED ORDER — ACETAMINOPHEN 10 MG/ML IV SOLN
1000.0000 mg | INTRAVENOUS | Status: AC
  Administered 2015-06-29: 1000 mg via INTRAVENOUS
  Filled 2015-06-29: qty 100

## 2015-06-29 MED ORDER — LACTATED RINGERS IV SOLN
INTRAVENOUS | Status: DC | PRN
  Administered 2015-06-29 (×2): via INTRAVENOUS

## 2015-06-29 MED ORDER — FENTANYL CITRATE (PF) 100 MCG/2ML IJ SOLN
INTRAMUSCULAR | Status: DC | PRN
  Administered 2015-06-29 (×15): 50 ug via INTRAVENOUS
  Administered 2015-06-29: 100 ug via INTRAVENOUS

## 2015-06-29 MED ORDER — METOPROLOL TARTRATE 5 MG/5ML IV SOLN
5.0000 mg | Freq: Four times a day (QID) | INTRAVENOUS | Status: DC
  Administered 2015-06-29 – 2015-07-05 (×15): 5 mg via INTRAVENOUS
  Filled 2015-06-29 (×24): qty 5

## 2015-06-29 MED ORDER — FENTANYL CITRATE (PF) 250 MCG/5ML IJ SOLN
INTRAMUSCULAR | Status: AC
  Filled 2015-06-29: qty 5

## 2015-06-29 MED ORDER — DEXAMETHASONE SODIUM PHOSPHATE 10 MG/ML IJ SOLN
INTRAMUSCULAR | Status: AC
  Filled 2015-06-29: qty 1

## 2015-06-29 MED ORDER — PROPOFOL 10 MG/ML IV BOLUS
INTRAVENOUS | Status: DC | PRN
  Administered 2015-06-29: 200 mg via INTRAVENOUS

## 2015-06-29 MED ORDER — SODIUM CHLORIDE 0.9 % IV SOLN
INTRAVENOUS | Status: DC | PRN
  Administered 2015-06-29: 60 mL

## 2015-06-29 MED ORDER — LACTATED RINGERS IV SOLN
INTRAVENOUS | Status: DC
  Administered 2015-06-29: 20:00:00 via INTRAVENOUS

## 2015-06-29 MED ORDER — MORPHINE SULFATE (PF) 4 MG/ML IV SOLN
INTRAVENOUS | Status: AC
  Filled 2015-06-29: qty 2

## 2015-06-29 MED ORDER — ROCURONIUM BROMIDE 50 MG/5ML IV SOLN
INTRAVENOUS | Status: AC
  Filled 2015-06-29: qty 2

## 2015-06-29 MED ORDER — ONDANSETRON HCL 4 MG/2ML IJ SOLN
4.0000 mg | Freq: Four times a day (QID) | INTRAMUSCULAR | Status: DC | PRN
  Administered 2015-06-30 (×2): 4 mg via INTRAVENOUS
  Filled 2015-06-29: qty 2

## 2015-06-29 MED ORDER — ACETAMINOPHEN 10 MG/ML IV SOLN
INTRAVENOUS | Status: AC
  Filled 2015-06-29: qty 100

## 2015-06-29 MED ORDER — FENTANYL CITRATE (PF) 100 MCG/2ML IJ SOLN
25.0000 ug | INTRAMUSCULAR | Status: DC | PRN
  Administered 2015-06-29 (×2): 50 ug via INTRAVENOUS

## 2015-06-29 MED ORDER — MIDAZOLAM HCL 2 MG/2ML IJ SOLN
INTRAMUSCULAR | Status: AC
  Filled 2015-06-29: qty 2

## 2015-06-29 MED ORDER — POTASSIUM CHLORIDE IN NACL 20-0.45 MEQ/L-% IV SOLN
INTRAVENOUS | Status: DC
  Administered 2015-06-29: 1000 mL via INTRAVENOUS
  Administered 2015-06-30 – 2015-07-02 (×6): via INTRAVENOUS
  Administered 2015-07-03: 75 mL/h via INTRAVENOUS
  Administered 2015-07-04: 50 mL/h via INTRAVENOUS
  Administered 2015-07-05 (×2): via INTRAVENOUS
  Filled 2015-06-29 (×16): qty 1000

## 2015-06-29 MED ORDER — MEPERIDINE HCL 50 MG/ML IJ SOLN: 6.2500 mg | INTRAMUSCULAR | Status: DC | PRN

## 2015-06-29 MED ORDER — BUPIVACAINE LIPOSOME 1.3 % IJ SUSP
20.0000 mL | Freq: Once | INTRAMUSCULAR | Status: DC
  Filled 2015-06-29: qty 20

## 2015-06-29 MED ORDER — FENTANYL CITRATE (PF) 250 MCG/5ML IJ SOLN
INTRAMUSCULAR | Status: AC
Start: 2015-06-29 — End: 2015-06-29
  Filled 2015-06-29: qty 5

## 2015-06-29 MED ORDER — FENTANYL CITRATE (PF) 100 MCG/2ML IJ SOLN
INTRAMUSCULAR | Status: AC
  Filled 2015-06-29: qty 2

## 2015-06-29 MED ORDER — LACTATED RINGERS IR SOLN
Status: DC | PRN
  Administered 2015-06-29 (×2): 1000 mL

## 2015-06-29 MED ORDER — MORPHINE SULFATE 10 MG/ML IJ SOLN
INTRAMUSCULAR | Status: DC | PRN
  Administered 2015-06-29 (×3): 2 mg via INTRAVENOUS

## 2015-06-29 MED ORDER — SODIUM CHLORIDE 0.9 % IV SOLN: 50.0000 mL | Freq: Once | INTRAVENOUS | Status: DC

## 2015-06-29 MED ORDER — ROCURONIUM BROMIDE 100 MG/10ML IV SOLN
INTRAVENOUS | Status: DC | PRN
  Administered 2015-06-29: 60 mg via INTRAVENOUS
  Administered 2015-06-29 (×3): 20 mg via INTRAVENOUS
  Administered 2015-06-29: 10 mg via INTRAVENOUS
  Administered 2015-06-29: 20 mg via INTRAVENOUS
  Administered 2015-06-29: 10 mg via INTRAVENOUS

## 2015-06-29 MED ORDER — ONDANSETRON HCL 4 MG/2ML IJ SOLN
4.0000 mg | Freq: Four times a day (QID) | INTRAMUSCULAR | Status: DC | PRN
  Filled 2015-06-29: qty 2

## 2015-06-29 MED ORDER — ALVIMOPAN 12 MG PO CAPS
12.0000 mg | ORAL_CAPSULE | Freq: Once | ORAL | Status: AC
  Administered 2015-06-29: 12 mg via ORAL
  Filled 2015-06-29: qty 1

## 2015-06-29 MED ORDER — SODIUM CHLORIDE 0.9% FLUSH
9.0000 mL | INTRAVENOUS | Status: DC | PRN
  Administered 2015-07-01: 9 mL via INTRAVENOUS
  Filled 2015-06-29: qty 9

## 2015-06-29 MED ORDER — DEXTROSE 5 % IV SOLN: 2.0000 g | INTRAVENOUS | Status: DC

## 2015-06-29 MED ORDER — PROMETHAZINE HCL 25 MG/ML IJ SOLN
INTRAMUSCULAR | Status: AC
  Filled 2015-06-29: qty 1

## 2015-06-29 MED ORDER — ACETAMINOPHEN 10 MG/ML IV SOLN
1000.0000 mg | Freq: Once | INTRAVENOUS | Status: AC
  Administered 2015-06-29: 1000 mg via INTRAVENOUS

## 2015-06-29 MED ORDER — SODIUM CHLORIDE 0.9 % IJ SOLN
INTRAMUSCULAR | Status: AC
  Filled 2015-06-29: qty 30

## 2015-06-29 MED ORDER — LIDOCAINE HCL (CARDIAC) 20 MG/ML IV SOLN
INTRAVENOUS | Status: DC | PRN
  Administered 2015-06-29: 100 mg via INTRAVENOUS

## 2015-06-29 MED ORDER — SUGAMMADEX SODIUM 200 MG/2ML IV SOLN
INTRAVENOUS | Status: DC | PRN
  Administered 2015-06-29: 200 mg via INTRAVENOUS

## 2015-06-29 MED ORDER — PROMETHAZINE HCL 25 MG/ML IJ SOLN
12.5000 mg | Freq: Once | INTRAMUSCULAR | Status: AC
  Administered 2015-06-29: 12.5 mg via INTRAVENOUS

## 2015-06-29 MED ORDER — 0.9 % SODIUM CHLORIDE (POUR BTL) OPTIME
TOPICAL | Status: DC | PRN
  Administered 2015-06-29: 4000 mL
  Administered 2015-06-29 (×2): 2000 mL

## 2015-06-29 MED ORDER — CEFOTETAN DISODIUM-DEXTROSE 2-2.08 GM-% IV SOLR
INTRAVENOUS | Status: AC
  Filled 2015-06-29: qty 50

## 2015-06-29 MED ORDER — SODIUM CHLORIDE 0.9 % IJ SOLN
INTRAMUSCULAR | Status: AC
  Filled 2015-06-29: qty 10

## 2015-06-29 MED ORDER — ONDANSETRON HCL 4 MG PO TABS: 4.0000 mg | ORAL_TABLET | Freq: Four times a day (QID) | ORAL | Status: DC | PRN

## 2015-06-29 MED ORDER — PHENYLEPHRINE 40 MCG/ML (10ML) SYRINGE FOR IV PUSH (FOR BLOOD PRESSURE SUPPORT)
PREFILLED_SYRINGE | INTRAVENOUS | Status: AC
  Filled 2015-06-29: qty 10

## 2015-06-29 MED ORDER — ONDANSETRON HCL 4 MG/2ML IJ SOLN
INTRAMUSCULAR | Status: AC
  Filled 2015-06-29: qty 4

## 2015-06-29 MED ORDER — MIDAZOLAM HCL 5 MG/5ML IJ SOLN
INTRAMUSCULAR | Status: DC | PRN
  Administered 2015-06-29: 2 mg via INTRAVENOUS

## 2015-06-29 MED ORDER — NALOXONE HCL 0.4 MG/ML IJ SOLN: 0.4000 mg | INTRAMUSCULAR | Status: DC | PRN

## 2015-06-29 MED ORDER — LIDOCAINE HCL (CARDIAC) 20 MG/ML IV SOLN
INTRAVENOUS | Status: AC
  Filled 2015-06-29: qty 5

## 2015-06-29 MED ORDER — ONDANSETRON HCL 4 MG/2ML IJ SOLN
INTRAMUSCULAR | Status: DC | PRN
  Administered 2015-06-29 (×2): 2 mg via INTRAVENOUS
  Administered 2015-06-29: 4 mg via INTRAVENOUS

## 2015-06-29 MED ORDER — ALVIMOPAN 12 MG PO CAPS
12.0000 mg | ORAL_CAPSULE | Freq: Two times a day (BID) | ORAL | Status: DC
  Administered 2015-06-30 – 2015-07-02 (×5): 12 mg via ORAL
  Filled 2015-06-29 (×6): qty 1

## 2015-06-29 MED ORDER — BUPIVACAINE-EPINEPHRINE (PF) 0.25% -1:200000 IJ SOLN
INTRAMUSCULAR | Status: AC
Start: 2015-06-29 — End: 2015-06-29
  Filled 2015-06-29: qty 30

## 2015-06-29 MED ORDER — SUGAMMADEX SODIUM 200 MG/2ML IV SOLN
INTRAVENOUS | Status: AC
  Filled 2015-06-29: qty 2

## 2015-06-29 MED ORDER — DEXAMETHASONE SODIUM PHOSPHATE 10 MG/ML IJ SOLN
INTRAMUSCULAR | Status: DC | PRN
  Administered 2015-06-29: 5 mg via INTRAVENOUS

## 2015-06-29 MED ORDER — CEFOTETAN DISODIUM 2 G IJ SOLR
2.0000 g | INTRAMUSCULAR | Status: AC
  Administered 2015-06-29: 2 g via INTRAVENOUS
  Filled 2015-06-29: qty 2

## 2015-06-29 MED ORDER — PHENYLEPHRINE HCL 10 MG/ML IJ SOLN
INTRAMUSCULAR | Status: DC | PRN
  Administered 2015-06-29: 80 ug via INTRAVENOUS

## 2015-06-29 MED ORDER — ALVIMOPAN 12 MG PO CAPS: 12.0000 mg | ORAL_CAPSULE | Freq: Once | ORAL | Status: DC

## 2015-06-29 SURGICAL SUPPLY — 83 items
APPLICATOR COTTON TIP 6IN STRL (MISCELLANEOUS) ×4 IMPLANT
APPLIER CLIP 5 13 M/L LIGAMAX5 (MISCELLANEOUS)
APPLIER CLIP ROT 10 11.4 M/L (STAPLE)
APR CLP MED LRG 11.4X10 (STAPLE)
APR CLP MED LRG 5 ANG JAW (MISCELLANEOUS)
BLADE EXTENDED COATED 6.5IN (ELECTRODE) IMPLANT
BLADE HEX COATED 2.75 (ELECTRODE) ×6 IMPLANT
CELLS DAT CNTRL 66122 CELL SVR (MISCELLANEOUS) IMPLANT
CLIP APPLIE 5 13 M/L LIGAMAX5 (MISCELLANEOUS) IMPLANT
CLIP APPLIE ROT 10 11.4 M/L (STAPLE) IMPLANT
CLOSURE WOUND 1/2 X4 (GAUZE/BANDAGES/DRESSINGS)
COUNTER NEEDLE 20 DBL MAG RED (NEEDLE) ×3 IMPLANT
COVER SURGICAL LIGHT HANDLE (MISCELLANEOUS) ×3 IMPLANT
CUTTER FLEX LINEAR 45M (STAPLE) IMPLANT
DECANTER SPIKE VIAL GLASS SM (MISCELLANEOUS) ×3 IMPLANT
DRAIN PENROSE 18X1/4 LTX STRL (WOUND CARE) ×2 IMPLANT
DRAPE LAPAROSCOPIC ABDOMINAL (DRAPES) ×3 IMPLANT
DRAPE UTILITY XL STRL (DRAPES) ×6 IMPLANT
DRESSING TELFA ISLAND 4X8 (GAUZE/BANDAGES/DRESSINGS) ×2 IMPLANT
DRSG TELFA 3X8 NADH (GAUZE/BANDAGES/DRESSINGS) ×3 IMPLANT
ELECT REM PT RETURN 9FT ADLT (ELECTROSURGICAL) ×3
ELECTRODE REM PT RTRN 9FT ADLT (ELECTROSURGICAL) ×1 IMPLANT
FILTER SMOKE EVAC LAPAROSHD (FILTER) IMPLANT
GAUZE SPONGE 2X2 8PLY STRL LF (GAUZE/BANDAGES/DRESSINGS) IMPLANT
GAUZE SPONGE 4X4 12PLY STRL (GAUZE/BANDAGES/DRESSINGS) IMPLANT
GLOVE SURG SIGNA 7.5 PF LTX (GLOVE) ×6 IMPLANT
GOWN SPEC L4 XLG W/TWL (GOWN DISPOSABLE) ×3 IMPLANT
GOWN STRL REUS W/ TWL XL LVL3 (GOWN DISPOSABLE) ×3 IMPLANT
GOWN STRL REUS W/TWL XL LVL3 (GOWN DISPOSABLE) ×9
LEGGING LITHOTOMY PAIR STRL (DRAPES) IMPLANT
LIGASURE IMPACT 36 18CM CVD LR (INSTRUMENTS) ×2 IMPLANT
LIQUID BAND (GAUZE/BANDAGES/DRESSINGS) IMPLANT
NS IRRIG 1000ML POUR BTL (IV SOLUTION) ×3 IMPLANT
PACK COLON (CUSTOM PROCEDURE TRAY) ×3 IMPLANT
PAD DRESSING TELFA 3X8 NADH (GAUZE/BANDAGES/DRESSINGS) IMPLANT
RELOAD 45 VASCULAR/THIN (ENDOMECHANICALS) IMPLANT
RELOAD STAPLE 45 2.5 WHT GRN (ENDOMECHANICALS) IMPLANT
RELOAD STAPLE 45 3.5 BLU ETS (ENDOMECHANICALS) IMPLANT
RELOAD STAPLE 60 3.6 BLU REG (STAPLE) ×1 IMPLANT
RELOAD STAPLE 60 4.1 GRN THCK (STAPLE) IMPLANT
RELOAD STAPLE TA45 3.5 REG BLU (ENDOMECHANICALS) IMPLANT
RELOAD STAPLER BLUE 60MM (STAPLE) ×1 IMPLANT
RELOAD STAPLER GREEN 60MM (STAPLE) ×1 IMPLANT
RETRACTOR WND ALEXIS 18 MED (MISCELLANEOUS) IMPLANT
RTRCTR WOUND ALEXIS 18CM MED (MISCELLANEOUS)
SET IRRIG TUBING LAPAROSCOPIC (IRRIGATION / IRRIGATOR) ×5 IMPLANT
SHEARS HARMONIC ACE PLUS 36CM (ENDOMECHANICALS) ×2 IMPLANT
SLEEVE ADV FIXATION 5X100MM (TROCAR) ×6 IMPLANT
SLEEVE XCEL OPT CAN 5 100 (ENDOMECHANICALS) ×9 IMPLANT
SOLUTION ANTI FOG 6CC (MISCELLANEOUS) ×3 IMPLANT
SPONGE GAUZE 2X2 STER 10/PKG (GAUZE/BANDAGES/DRESSINGS) ×2
SPONGE LAP 18X18 X RAY DECT (DISPOSABLE) ×6 IMPLANT
STAPLE ECHEON FLEX 60 POW ENDO (STAPLE) ×2 IMPLANT
STAPLER RELOAD BLUE 60MM (STAPLE) ×3
STAPLER RELOAD GREEN 60MM (STAPLE) ×3
STAPLER VISISTAT 35W (STAPLE) IMPLANT
STRIP CLOSURE SKIN 1/2X4 (GAUZE/BANDAGES/DRESSINGS) IMPLANT
SUT MNCRL AB 4-0 PS2 18 (SUTURE) ×3 IMPLANT
SUT NOVA NAB DX-16 0-1 5-0 T12 (SUTURE) IMPLANT
SUT PDS AB 1 TP1 96 (SUTURE) ×4 IMPLANT
SUT PROLENE 2 0 KS (SUTURE) IMPLANT
SUT PROLENE 2 0 SH DA (SUTURE) IMPLANT
SUT SILK 2 0 (SUTURE) ×3
SUT SILK 2 0 SH CR/8 (SUTURE) ×13 IMPLANT
SUT SILK 2-0 18XBRD TIE 12 (SUTURE) ×1 IMPLANT
SUT SILK 3 0 (SUTURE) ×3
SUT SILK 3 0 SH CR/8 (SUTURE) ×3 IMPLANT
SUT SILK 3-0 18XBRD TIE 12 (SUTURE) ×1 IMPLANT
SUT VIC AB 2-0 SH 18 (SUTURE) ×2 IMPLANT
SUT VIC AB 2-0 SH 27 (SUTURE) ×3
SUT VIC AB 2-0 SH 27X BRD (SUTURE) IMPLANT
SYS LAPSCP GELPORT 120MM (MISCELLANEOUS) ×3
SYSTEM LAPSCP GELPORT 120MM (MISCELLANEOUS) IMPLANT
TOWEL OR NON WOVEN STRL DISP B (DISPOSABLE) ×6 IMPLANT
TRAY FOLEY W/METER SILVER 14FR (SET/KITS/TRAYS/PACK) ×3 IMPLANT
TRAY FOLEY W/METER SILVER 16FR (SET/KITS/TRAYS/PACK) ×1 IMPLANT
TROCAR BLADELESS OPT 5 100 (ENDOMECHANICALS) ×3 IMPLANT
TROCAR XCEL 12X100 BLDLESS (ENDOMECHANICALS) IMPLANT
TROCAR XCEL BLUNT TIP 100MML (ENDOMECHANICALS) IMPLANT
TROCAR XCEL NON-BLD 11X100MML (ENDOMECHANICALS) IMPLANT
TROCAR XCEL UNIV SLVE 11M 100M (ENDOMECHANICALS) IMPLANT
TUBING INSUF HEATED (TUBING) ×5 IMPLANT
YANKAUER SUCT BULB TIP NO VENT (SUCTIONS) IMPLANT

## 2015-06-29 NOTE — Anesthesia Preprocedure Evaluation (Signed)
Anesthesia Evaluation  Patient identified by MRN, date of birth, ID band Patient awake    Reviewed: Allergy & Precautions, NPO status , Patient's Chart, lab work & pertinent test results  Airway Mallampati: II  TM Distance: >3 FB Neck ROM: Full    Dental no notable dental hx.    Pulmonary sleep apnea , former smoker,    Pulmonary exam normal breath sounds clear to auscultation       Cardiovascular hypertension, Pt. on medications +CHF  Normal cardiovascular exam+ dysrhythmias Atrial Fibrillation  Rhythm:Regular Rate:Normal     Neuro/Psych PSYCHIATRIC DISORDERS Anxiety negative neurological ROS     GI/Hepatic negative GI ROS, Neg liver ROS,   Endo/Other  negative endocrine ROS  Renal/GU negative Renal ROS     Musculoskeletal  (+) Arthritis ,   Abdominal (+) + obese,   Peds  Hematology negative hematology ROS (+) anemia ,   Anesthesia Other Findings   Reproductive/Obstetrics negative OB ROS                             Anesthesia Physical Anesthesia Plan  ASA: III  Anesthesia Plan: General   Post-op Pain Management:    Induction: Intravenous  Airway Management Planned: Oral ETT  Additional Equipment:   Intra-op Plan:   Post-operative Plan: Extubation in OR  Informed Consent: I have reviewed the patients History and Physical, chart, labs and discussed the procedure including the risks, benefits and alternatives for the proposed anesthesia with the patient or authorized representative who has indicated his/her understanding and acceptance.   Dental advisory given  Plan Discussed with: CRNA  Anesthesia Plan Comments:         Anesthesia Quick Evaluation

## 2015-06-29 NOTE — Interval H&P Note (Signed)
History and Physical Interval Note:  06/29/2015 12:51 PM  Crystal Brennan  has presented today for surgery, with the diagnosis of Diverticulitis  The various methods of treatment have been discussed with the patient and family.  She completed her mechanical and antibiotic bowel prep at home.  Her daughter is with her.  She stopped her Eliquis about 7 days ago.  After consideration of risks, benefits and other options for treatment, the patient has consented to  Procedure(s): COLON RESECTION LAPAROSCOPIC (N/A) as a surgical intervention .  The patient's history has been reviewed, patient examined, no change in status, stable for surgery.  I have reviewed the patient's chart and labs.  Questions were answered to the patient's satisfaction.     Tawanda Schall H

## 2015-06-29 NOTE — Anesthesia Procedure Notes (Signed)
Procedure Name: Intubation Date/Time: 06/29/2015 2:34 PM Performed by: Freddie Breech Pre-anesthesia Checklist: Patient identified, Emergency Drugs available, Suction available, Patient being monitored and Timeout performed Patient Re-evaluated:Patient Re-evaluated prior to inductionOxygen Delivery Method: Circle system utilized Preoxygenation: Pre-oxygenation with 100% oxygen Intubation Type: IV induction Ventilation: Mask ventilation without difficulty and Oral airway inserted - appropriate to patient size Laryngoscope Size: Mac and 3 Grade View: Grade II Tube type: Oral Tube size: 7.5 mm Number of attempts: 1 Airway Equipment and Method: Patient positioned with wedge pillow and Stylet Placement Confirmation: ETT inserted through vocal cords under direct vision,  positive ETCO2,  CO2 detector and breath sounds checked- equal and bilateral Secured at: 22 cm Tube secured with: Tape Dental Injury: Teeth and Oropharynx as per pre-operative assessment

## 2015-06-29 NOTE — Transfer of Care (Signed)
Immediate Anesthesia Transfer of Care Note  Patient: Crystal Brennan  Procedure(s) Performed: Procedure(s):  LAPAROSCOPIC ASSISTED SIGMOID COLECTOMY WITH MOBILIZATION OF SPLENIC  FLEXURE (N/A)  Patient Location: PACU  Anesthesia Type:General  Level of Consciousness:  sedated, patient cooperative and responds to stimulation  Airway & Oxygen Therapy:Patient Spontanous Breathing and Patient connected to face mask oxgen  Post-op Assessment:  Report given to PACU RN and Post -op Vital signs reviewed and stable  Post vital signs:  Reviewed and stable  Last Vitals:  Filed Vitals:   06/29/15 1018  BP: 141/88  Pulse: 71  Temp: 36.9 C  Resp: 18    Complications: No apparent anesthesia complications

## 2015-06-29 NOTE — Progress Notes (Signed)
Nocturnal CPAP ordered per RT protocol secondary to home use. Patient not utilizing CPAP tonight due to discomfort of nasogastric tube in place. EtCO2 monitor continuous at this time. CPAP equipment left at bedside for use upon removal of ngt. Family remains at bedside. RT will continue to follow.

## 2015-06-30 ENCOUNTER — Encounter (HOSPITAL_COMMUNITY): Payer: Self-pay | Admitting: Surgery

## 2015-06-30 LAB — BASIC METABOLIC PANEL
Anion gap: 6 (ref 5–15)
BUN: 7 mg/dL (ref 6–20)
CHLORIDE: 109 mmol/L (ref 101–111)
CO2: 20 mmol/L — AB (ref 22–32)
CREATININE: 0.69 mg/dL (ref 0.44–1.00)
Calcium: 8.5 mg/dL — ABNORMAL LOW (ref 8.9–10.3)
GFR calc Af Amer: >60 mL/min (ref >60)
GFR calc non Af Amer: >60 mL/min (ref >60)
Glucose, Bld: 135 mg/dL — ABNORMAL HIGH (ref 65–99)
Potassium: 4.5 mmol/L (ref 3.5–5.1)
Sodium: 135 mmol/L (ref 135–145)

## 2015-06-30 LAB — CBC
HEMATOCRIT: 34.8 % — AB (ref 36.0–46.0)
HEMOGLOBIN: 11.2 g/dL — AB (ref 12.0–15.0)
MCH: 23.6 pg — AB (ref 26.0–34.0)
MCHC: 32.2 g/dL (ref 30.0–36.0)
MCV: 73.4 fL — AB (ref 78.0–100.0)
Platelets: 456 10*3/uL — ABNORMAL HIGH (ref 150–400)
RBC: 4.74 MIL/uL (ref 3.87–5.11)
RDW: 16.6 % — AB (ref 11.5–15.5)
WBC: 11.1 10*3/uL — ABNORMAL HIGH (ref 4.0–10.5)

## 2015-06-30 MED ORDER — LORAZEPAM 2 MG/ML IJ SOLN
1.0000 mg | Freq: Four times a day (QID) | INTRAMUSCULAR | Status: DC | PRN
  Administered 2015-06-30 – 2015-07-05 (×13): 1 mg via INTRAVENOUS
  Filled 2015-06-30 (×13): qty 1

## 2015-06-30 MED ORDER — HEPARIN SODIUM (PORCINE) 5000 UNIT/ML IJ SOLN
5000.0000 [IU] | Freq: Three times a day (TID) | INTRAMUSCULAR | Status: DC
  Administered 2015-06-30 – 2015-07-02 (×6): 5000 [IU] via SUBCUTANEOUS
  Filled 2015-06-30 (×9): qty 1

## 2015-06-30 MED ORDER — SODIUM CHLORIDE 0.9% FLUSH
10.0000 mL | INTRAVENOUS | Status: DC | PRN
  Administered 2015-07-02 – 2015-07-04 (×2): 10 mL
  Filled 2015-06-30 (×2): qty 40

## 2015-06-30 MED ORDER — ACETAMINOPHEN 10 MG/ML IV SOLN
1000.0000 mg | Freq: Once | INTRAVENOUS | Status: AC
  Administered 2015-06-30: 1000 mg via INTRAVENOUS
  Filled 2015-06-30: qty 100

## 2015-06-30 NOTE — Progress Notes (Signed)
[place] [facility] Office:  [phone] General Surgery Progress Note   LOS: 1 day  POD -  1 Day Post-Op  Assessment/Plan: 1.  LAPAROSCOPIC ASSISTED SIGMOID COLECTOMY WITH MOBILIZATION OF SPLENIC  FLEXURE - 06/29/2015 - D. Bunny Lowdermilk  For recurrent diverticulitis with colo-colonic fistula  She says that the pain medicine is not helping much.  Also complains of her left arm. Did not sleep well.  2. History of A. Fib Followed by Dr. Thresa Ross. She is not in A fib at this time 3. Anticoagulated on Eliquis  On hold for now 4. HTN 5. Morbid obesity - BMI 46.8 6. Quit smoking in November 2016. 7. OSA - seen by Dr. Elsworth Soho in 05/21/2014 8. History of depression/anxiety 9. Jehovah's witness - refuses blood transfusion 10. DVT prophylaxis - On hold for risk of bleeding and refusal of blood products.  Will start SQ heparin today.   Active Problems:   Diverticulitis of colon  Subjective:  Sore.  Pain somewhat difficult to control.  Also complains of her left arm. Did not sleep well.  Daughter, Pricilla Loveless, in room with patient.  Objective:   Filed Vitals:   06/30/15 0442 06/30/15 0532  BP:  151/83  Pulse:  88  Temp:  98.3 F (36.8 C)  Resp: 27 29     Intake/Output from previous day:  05/16 0701 - 05/17 0700 In: 3216.7 [I.V.:3166.7; IV Piggyback:50] Out: 1200 [Urine:1000; Blood:200]  Intake/Output this shift:      Physical Exam:   General: Mo4bidly obese AA F who is alert and oriented.    HEENT: Normal. Pupils equal. .   Lungs: Clear.  To get IS>    Abdomen: Actually has a few BS.   Wound: Some drainage.     Lab Results:    Recent Labs  06/29/15 1027 06/30/15 0444  WBC 8.4 11.1*  HGB 11.0* 11.2*  HCT 34.4* 34.8*  PLT 412* 456*    BMET   Recent Labs  06/29/15 1240 06/30/15 0444  NA 136 135  K 4.4 4.5  CL 110 109  CO2 19* 20*  GLUCOSE 74 135*  BUN 6 7  CREATININE 0.56 0.69  CALCIUM 8.7* 8.5*    PT/INR   Recent Labs   06/29/15 1027  LABPROT 14.8  INR 1.14    ABG  No results for input(s): PHART, HCO3 in the last 72 hours.  Invalid input(s): PCO2, PO2   Studies/Results:  No results found.   Anti-infectives:   Anti-infectives    Start     Dose/Rate Route Frequency Ordered Stop   06/29/15 1045  cefoTEtan (CEFOTAN) 2 g in dextrose 5 % 50 mL IVPB     2 g 100 mL/hr over 30 Minutes Intravenous On call to O.R. 06/29/15 1030 06/29/15 1342   06/29/15 1045  cefoTEtan (CEFOTAN) 2 g in dextrose 5 % 50 mL IVPB  Status:  Discontinued     2 g 100 mL/hr over 30 Minutes Intravenous On call to O.R. 06/29/15 1030 06/29/15 1032      Alphonsa Overall, MD, FACS Pager: [place] Office: [phone] 06/30/2015

## 2015-06-30 NOTE — Op Note (Signed)
[name], Brennan NO.:  [id number]  MEDICAL RECORD NO.:  [id number]  LOCATION:  [id number]                         FACILITY:  [facility]  PHYSICIAN:  Fenton Malling. Lucia Gaskins, M.D.  DATE OF BIRTH:  [birth year]  DATE OF PROCEDURE:  06/29/2015                              OPERATIVE REPORT   PREOPERATIVE DIAGNOSIS:  Recurrent diverticulitis.  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  PROCEDURE:  Laparoscopic-assisted sigmoid colectomy with mobilization of splenic flexure.  SURGEON:  Fenton Malling. Lucia Gaskins, M.D.  FIRST ASSISTANT:  Leighton Ruff. Redmond Pulling, MD, FACS and Isabel Caprice. Hassell Done, MD  ANESTHESIA:  General endotracheal, supervised by Dr. Alver Fisher.  ESTIMATED BLOOD LOSS:  250 mL.  DRAINS:  Left in were Telfa wicks in the wound.  LOCAL ANESTHETIC:  20 mL of Exparel mixed with 30 mL of saline.  SPECIMEN:  Sigmoid colon.  COUNTS:  Correct.  INDICATION FOR PROCEDURE:  [name] is a 50 year old African American female who has had multiple bouts with diverticulitis, once required a percutaneous drain of diverticular abscess in January of 2017, and despite recurrent courses of antibiotics, she continues to have symptoms.  We tried to get a colonoscopy before surgery, but we could never get her symptom free enough to do this, so she has been maintained on outpatient oral antibiotics up until time of surgery.  She was scheduled 2 weeks ago, but was taking her Eliquis up until about a day before surgery, so we delayed her surgery until she could be off the Eliquis for longer period of time.  She has completed her antibiotic and mechanical bowel prep at home, she has been off her Eliquis for 6 days, and she is ready for surgery.  I discussed with her the indications, potential complications of colon surgery, potential complications include, but not limited to, bleeding, infection, nerve injury, ureteral injury, leak from the bowel, and  recurrent diverticulitis.  I told her we would try to do the surgery as much as possible laparoscopically, but depending on the findings, she will have an incision of her abdomen whose size depends on what we need to do.  OPERATIVE NOTE:  The patient was taken to room #1 and placed in lithotomy position.  A Foley catheter in place, OG tube in place, given 2 g of cefotetan at the initiation of procedure.  She had taken her Entereg preoperatively.  We tucked both arms.  Anesthesia had a lot of trouble of the IV before the surgery. After an hour and a half they did get IV access.  With difficulty they had, I will place a PICC line her post op.  Both her arms were tucked and she was in lithotomy position, prepped her abdomen with ChloraPrep and perineum with Betadine, and sterilely draped.    A time-out was held and surgical checklist run.    I accessed her abdominal cavity with a 5 mm trocar in the right upper quadrant.  I placed five additional 5 mm trocars; one in right mid abdomen, one in right lower abdomen, one in left upper quadrant, one in left mid abdomen and one in left lower abdomen.  I then did abdominal exploration.  The liver was unremarkable and stomach unremarkable.  She had a fair amount of omentum.  The omentum was stuck down towards her anterior abdominal wall probably where her prior drain site was, but this came down fairly easily.  It was interesting that she had her sigmoid colon lay across the midline, sort of retroperitoneal, and then came back and went all the way over to the cecum on the right side.  I was able to mobilize most of the small bowel that she clearly had in area where the small bowel and its mesentery were densely adhered to the colon.    I then went to mobilize the left colon and splenic flexure, which actually I think part of this fully across the left colon to the splenic flexure and the transverse colon.  I thought I got some mobility with the left colon  mobilization.  I hit an impasse with the laparoscopic exploration trying to expose the small bowel, its mesentery, and the sigmoid colon with the central inflammatory mass.  I put a hand port in out below the umbilicus, but just could not laparoscopically see well enough to feel comfortable separating the colon and small bowel.    So I made a larger laparotomy incision, to be able to see the sigmoid colon and its adhesions/inflammatory mass.  She had adhesions of the colon across the midline towards the pelvis, mainly the small bowel attached to it, I was able to mobilize this up.  Because she is so heavy, it was hard to see retroperitoneum and in my dissection I tried to stay close  the colon.  I did not identify ureters, but I tried to keep my dissection close to the colon.    I did find a fistula between her proximal sigmoid colon to her distal sigmoid colon, and the bowel was markedly inflamed, thickened and I think this was the cause of her chronic recurrent diverticulitis.  The inflammatory mass was attached to the mesentery of the small bowel.  I took this off the mesentery of small bowel.  The adhesions/wall of the inflammation mass was mid small bowel mesentery.   After I was able to get the colon up, I then divided the distal colon about 10 cm above the cul-de-sac and divided at the junction of the left colon and sigmoid colon proximally.  I had Dr. Redmond Pulling go below to see how close this would be to do an EEA anastomosis, but clearly I was far enough away from the peritoneal fold for me to get down low would require a lot more dissection in this large lady.  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  The second thing was that I had to do a lot more to mobilize her left colon, so I thought I mobilized pretty well, but this was not coming down with the dissection that I had done.    So I made the decision  that I thought I had resected her primary disease, though I did not think that the anastomosis as well as I would like to go, I thought I had done this safely so far and then adding an operation to mobilize her left and transverse colon down towards the rectum further without a lot more morbidity to the operation without a lot of benefit.  So, therefore, I divided the colon bowel with a  suture and marking the distal colon.  I did a hand-sewn end-to-end anastomosis with interrupted 2-0 silk sutures.  These were inverting sutures, posteriorly and then anteriorly, they were inverting and this created a 2.5-3 cm open between the left colon and distal sigmoid colon.  I placed a clamp across the bowel proximally on the anastomosis.  Dr. Hassell Done went below and insufflated the rectum with air,I placed the anastomosis under water.  There was no leak from the anastomosis.  The bowel looked viable.   I irrigated the abdomen with about 5 liters of saline.  She is a Sales promotion account executive Witness, refused blood transfusion, I spent a lot of time with hemostasis during and at the end of the case.  I used the LigaSure, Harmonic Scalpel for ligations for mesentery, but also suture ligated some of the mesentery going to the colon.  After return to the colon through the abdominal wall, then ran the small bowel from the terminal ileum to the ligament of Treitz, again it was sort of mid small bowel, which had been stuck to this abscess/chronic inflammation, mainly the mesentery of the bowel and the bowel itself looked pretty good.  I thought there was no reason for any small bowel resection.    I then closed the peritoneum with 2-0 Vicryl suture. I then closed the abdominal wall with two running #1 PDS sutures, tied in the middle.    I then irrigated the wound with a liter of saline and closed the wound with staples.  I did re-laparoscope the patient and make sure the omentum was placed under the incision, there was no bleeding or no collection  of blood that noted at the time of laparoscopy.  I thought that the abdominal wound closed well.  I did infiltrate the midline wound with 20 mL of Exparel mixed with 30 mL of saline under direct visualization and then, I closed the skin with skin staples and placed Telfa wicks in the wound.  I then removed the trocars in turn.  I stapled the 6 trocar sites.  She tolerated the procedure well, was transferred to the recovery room in good condition.  Because of her IV access difficulties, she will get a PICC line placed on her tomorrow.  I did leave an NG tube at least overnight because I did spend a fair amount of time dissecting the small bowel and now making a decision that how long it stay in.  She was transferred to the recovery room in good condition.  The needle and lap count were correct.  Her recovery will depend on how she does and her pain control.   Fenton Malling. Lucia Gaskins, M.D., [facility], scribe for Epic   DHN/MEDQ  D:  06/29/2015  T:  06/30/2015  Job:  [id number]  cc:   Denice Bors. Stanford Breed, MD, [facility]  Jyothi Vennie Homans, MD, [facility] Fax: [phone]  [facility] [place]

## 2015-06-30 NOTE — Progress Notes (Signed)
Peripherally Inserted Central Catheter/Midline Placement  The IV Nurse has discussed with the patient and/or persons authorized to consent for the patient, the purpose of this procedure and the potential benefits and risks involved with this procedure.  The benefits include less needle sticks, lab draws from the catheter and patient may be discharged home with the catheter.  Risks include, but not limited to, infection, bleeding, blood clot (thrombus formation), and puncture of an artery; nerve damage and irregular heat beat.  Alternatives to this procedure were also discussed.  PICC/Midline Placement Documentation        Darlyn Read 06/30/2015, 1:27 PM

## 2015-07-01 LAB — BASIC METABOLIC PANEL
Anion gap: 3 — ABNORMAL LOW (ref 5–15)
BUN: 7 mg/dL (ref 6–20)
CALCIUM: 8.2 mg/dL — AB (ref 8.9–10.3)
CHLORIDE: 107 mmol/L (ref 101–111)
CO2: 26 mmol/L (ref 22–32)
CREATININE: 0.63 mg/dL (ref 0.44–1.00)
GFR calc Af Amer: >60 mL/min (ref >60)
GFR calc non Af Amer: >60 mL/min (ref >60)
Glucose, Bld: 92 mg/dL (ref 65–99)
Potassium: 3.9 mmol/L (ref 3.5–5.1)
SODIUM: 136 mmol/L (ref 135–145)

## 2015-07-01 LAB — CBC WITH DIFFERENTIAL/PLATELET
Basophils Absolute: 0 10*3/uL (ref 0.0–0.1)
Basophils Relative: 0 %
EOS ABS: 0.1 10*3/uL (ref 0.0–0.7)
EOS PCT: 1 %
HCT: 27.2 % — ABNORMAL LOW (ref 36.0–46.0)
HEMOGLOBIN: 8.9 g/dL — AB (ref 12.0–15.0)
LYMPHS ABS: 1.6 10*3/uL (ref 0.7–4.0)
Lymphocytes Relative: 20 %
MCH: 24 pg — AB (ref 26.0–34.0)
MCHC: 32.7 g/dL (ref 30.0–36.0)
MCV: 73.3 fL — ABNORMAL LOW (ref 78.0–100.0)
MONO ABS: 0.9 10*3/uL (ref 0.1–1.0)
MONOS PCT: 11 %
Neutro Abs: 5.4 10*3/uL (ref 1.7–7.7)
Neutrophils Relative %: 68 %
PLATELETS: 378 10*3/uL (ref 150–400)
RBC: 3.71 MIL/uL — ABNORMAL LOW (ref 3.87–5.11)
RDW: 16.6 % — AB (ref 11.5–15.5)
WBC: 8 10*3/uL (ref 4.0–10.5)

## 2015-07-01 MED ORDER — KETOROLAC TROMETHAMINE 30 MG/ML IJ SOLN
30.0000 mg | Freq: Once | INTRAMUSCULAR | Status: AC
  Administered 2015-07-01: 30 mg via INTRAVENOUS
  Filled 2015-07-01: qty 1

## 2015-07-01 MED ORDER — ONDANSETRON HCL 4 MG PO TABS: 4.0000 mg | ORAL_TABLET | Freq: Four times a day (QID) | ORAL | Status: DC | PRN

## 2015-07-01 MED ORDER — ONDANSETRON HCL 4 MG/2ML IJ SOLN
4.0000 mg | Freq: Four times a day (QID) | INTRAMUSCULAR | Status: DC | PRN
  Administered 2015-07-01 – 2015-07-03 (×2): 4 mg via INTRAVENOUS
  Filled 2015-07-01 (×2): qty 2

## 2015-07-01 MED ORDER — ACETAMINOPHEN 10 MG/ML IV SOLN
1000.0000 mg | Freq: Once | INTRAVENOUS | Status: AC
  Administered 2015-07-01: 1000 mg via INTRAVENOUS
  Filled 2015-07-01: qty 100

## 2015-07-01 NOTE — Progress Notes (Signed)
Pt refused CPAP qhs.  Pt states that she wishes to use the nasal cannula for tonight and requests RT to bring CPAP tomorrow night.  RT encouraged Pt to contact RT should she change her mind.  RT will continue to monitor as needed.

## 2015-07-01 NOTE — Procedures (Signed)
Placed patient on CPAP auto 6-20cm H20 for the night. Patient is tolerating well at this time.

## 2015-07-01 NOTE — Progress Notes (Signed)
Patient complaining of pain. MD paged. Order received for IV tylenol 1000 mg once. Order placed and medication administered.

## 2015-07-01 NOTE — Progress Notes (Addendum)
[facility] Office:  [phone] General Surgery Progress Note   LOS: 2 days  POD -  2 Days Post-Op  Assessment/Plan: 1.  LAPAROSCOPIC ASSISTED SIGMOID COLECTOMY WITH MOBILIZATION OF SPLENIC  FLEXURE - 06/29/2015 - D. Maciah Feeback  For recurrent diverticulitis with colo-colonic fistula  Doing a little better.  Will start ice chips/sips.  2. History of A. Fib Followed by Dr. Thresa Ross. She is not in A fib at this time 3. Anticoagulated on Eliquis  On hold for now 4. HTN 5. Morbid obesity - BMI 46.8 6. Quit smoking in November 2016. 7. OSA - seen by Dr. Elsworth Soho in 05/21/2014 8. History of depression/anxiety 9. Jehovah's witness - refuses blood transfusion 10. DVT prophylaxis - SQ heparin   Active Problems:   Diverticulitis of colon  Subjective:  Pain a little better controlled with the addition of ativan.    Mother in room.  Objective:   Filed Vitals:   07/01/15 0400 07/01/15 0529  BP:  127/68  Pulse:  80  Temp:  98.3 F (36.8 C)  Resp: 18 20     Intake/Output from previous day:  05/17 0701 - 05/18 0700 In: 3033.3 [I.V.:3033.3] Out: 1900 [Urine:1900]  Intake/Output this shift:      Physical Exam:   General: Morbidly obese AA F who is alert and oriented.    HEENT: Normal. Pupils equal. .   Lungs: Clear.  IS - 1,200 cc   Abdomen: Quiet.   Wound: Okay, wicks removed.  Wound okay.     Lab Results:     Recent Labs  06/30/15 0444 07/01/15 0447  WBC 11.1* 8.0  HGB 11.2* 8.9*  HCT 34.8* 27.2*  PLT 456* 378    BMET    Recent Labs  06/30/15 0444 07/01/15 0447  NA 135 136  K 4.5 3.9  CL 109 107  CO2 20* 26  GLUCOSE 135* 92  BUN 7 7  CREATININE 0.69 0.63  CALCIUM 8.5* 8.2*    PT/INR    Recent Labs  06/29/15 1027  LABPROT 14.8  INR 1.14    ABG  No results for input(s): PHART, HCO3 in the last 72 hours.  Invalid input(s): PCO2, PO2   Studies/Results:  No results found.   Anti-infectives:   Anti-infectives     Start     Dose/Rate Route Frequency Ordered Stop   06/29/15 1045  cefoTEtan (CEFOTAN) 2 g in dextrose 5 % 50 mL IVPB     2 g 100 mL/hr over 30 Minutes Intravenous On call to O.R. 06/29/15 1030 06/29/15 1342   06/29/15 1045  cefoTEtan (CEFOTAN) 2 g in dextrose 5 % 50 mL IVPB  Status:  Discontinued     2 g 100 mL/hr over 30 Minutes Intravenous On call to O.R. 06/29/15 1030 06/29/15 1032      Alphonsa Overall, MD, FACS Pager: [facility] Office: [phone] 07/01/2015

## 2015-07-02 LAB — CBC WITH DIFFERENTIAL/PLATELET
BASOS ABS: 0 10*3/uL (ref 0.0–0.1)
Basophils Relative: 0 %
Eosinophils Absolute: 0.2 10*3/uL (ref 0.0–0.7)
Eosinophils Relative: 4 %
HEMATOCRIT: 25 % — AB (ref 36.0–46.0)
HEMOGLOBIN: 8.1 g/dL — AB (ref 12.0–15.0)
LYMPHS PCT: 22 %
Lymphs Abs: 1.3 10*3/uL (ref 0.7–4.0)
MCH: 23.3 pg — ABNORMAL LOW (ref 26.0–34.0)
MCHC: 32.4 g/dL (ref 30.0–36.0)
MCV: 72 fL — AB (ref 78.0–100.0)
MONO ABS: 0.5 10*3/uL (ref 0.1–1.0)
Monocytes Relative: 7 %
NEUTROS ABS: 4.1 10*3/uL (ref 1.7–7.7)
NEUTROS PCT: 67 %
Platelets: 330 10*3/uL (ref 150–400)
RBC: 3.47 MIL/uL — AB (ref 3.87–5.11)
RDW: 16.6 % — AB (ref 11.5–15.5)
WBC: 6.1 10*3/uL (ref 4.0–10.5)

## 2015-07-02 LAB — BASIC METABOLIC PANEL
ANION GAP: 6 (ref 5–15)
BUN: 8 mg/dL (ref 6–20)
CHLORIDE: 105 mmol/L (ref 101–111)
CO2: 24 mmol/L (ref 22–32)
Calcium: 8.3 mg/dL — ABNORMAL LOW (ref 8.9–10.3)
Creatinine, Ser: 0.53 mg/dL (ref 0.44–1.00)
GFR calc Af Amer: >60 mL/min (ref >60)
GFR calc non Af Amer: >60 mL/min (ref >60)
GLUCOSE: 71 mg/dL (ref 65–99)
POTASSIUM: 3.7 mmol/L (ref 3.5–5.1)
Sodium: 135 mmol/L (ref 135–145)

## 2015-07-02 MED ORDER — HYDROCODONE-ACETAMINOPHEN 5-325 MG PO TABS
1.0000 | ORAL_TABLET | ORAL | Status: DC | PRN
  Administered 2015-07-02 (×3): 1 via ORAL
  Administered 2015-07-03 – 2015-07-05 (×11): 2 via ORAL
  Filled 2015-07-02: qty 1
  Filled 2015-07-02 (×4): qty 2
  Filled 2015-07-02: qty 1
  Filled 2015-07-02 (×6): qty 2
  Filled 2015-07-02: qty 1
  Filled 2015-07-02: qty 2

## 2015-07-02 NOTE — Progress Notes (Signed)
[facility] Office:  [phone] General Surgery Progress Note   LOS: 3 days  POD -  3 Days Post-Op  Assessment/Plan: 1.  LAPAROSCOPIC ASSISTED SIGMOID COLECTOMY WITH MOBILIZATION OF SPLENIC  FLEXURE - 06/29/2015 - D. Jarek Longton  Final path diverticulitis with colo-colonic fistula  Start clear liquids, advance to full liquids tonight if she is doing well.  2. History of A. Fib Followed by Dr. Thresa Ross. She is not in A fib at this time 3. Anticoagulated on Eliquis  On hold for now 4. HTN 5. Morbid obesity - BMI 46.8 6. Quit smoking in November 2016. 7. OSA - seen by Dr. Elsworth Soho in 05/21/2014 8. History of depression/anxiety 9. Jehovah's witness - refuses blood transfusion 10. DVT prophylaxis - Was on SQ heparin, but will hold for sagging Hgb. 11.  Anemia  Hgb - 8.1 - 07/02/2015   Active Problems:   Diverticulitis of colon  Subjective:  A little better today.  She said that she slept better last PM and could use her CPAP.  She had a small BM.  Objective:   Filed Vitals:   07/02/15 0503 07/02/15 0525  BP:  129/63  Pulse:  86  Temp:  98.3 F (36.8 C)  Resp: 20 20     Intake/Output from previous day:  05/18 0701 - 05/19 0700 In: 2510 [I.V.:2510] Out: 2500 [Urine:2500]  Intake/Output this shift:      Physical Exam:   General: Morbidly obese AA F who is alert and oriented.    HEENT: Normal. Pupils equal. .   Lungs: Clear.     Abdomen: Few BS   Wound:   Wound okay.     Lab Results:     Recent Labs  07/01/15 0447 07/02/15 0600  WBC 8.0 6.1  HGB 8.9* 8.1*  HCT 27.2* 25.0*  PLT 378 330    BMET    Recent Labs  07/01/15 0447 07/02/15 0600  NA 136 135  K 3.9 3.7  CL 107 105  CO2 26 24  GLUCOSE 92 71  BUN 7 8  CREATININE 0.63 0.53  CALCIUM 8.2* 8.3*    PT/INR    Recent Labs  06/29/15 1027  LABPROT 14.8  INR 1.14    ABG  No results for input(s): PHART, HCO3 in the last 72 hours.  Invalid input(s): PCO2,  PO2   Studies/Results:  No results found.   Anti-infectives:   Anti-infectives    Start     Dose/Rate Route Frequency Ordered Stop   06/29/15 1045  cefoTEtan (CEFOTAN) 2 g in dextrose 5 % 50 mL IVPB     2 g 100 mL/hr over 30 Minutes Intravenous On call to O.R. 06/29/15 1030 06/29/15 1342   06/29/15 1045  cefoTEtan (CEFOTAN) 2 g in dextrose 5 % 50 mL IVPB  Status:  Discontinued     2 g 100 mL/hr over 30 Minutes Intravenous On call to O.R. 06/29/15 1030 06/29/15 1032      Crystal Overall, MD, FACS Pager: [facility] Office: [phone] 07/02/2015

## 2015-07-02 NOTE — Plan of Care (Signed)
Problem: Bowel/Gastric: Goal: Gastrointestinal status for postoperative course will improve Outcome: Completed/Met Date Met:  07/02/15 Pt has had BM and is passing flatus

## 2015-07-02 NOTE — Progress Notes (Signed)
Pharmacy Brief Note - Alvimopan (Entereg)   The standing order set for alvimopan (Entereg) now includes an automatic order to discontinue the drug after the patient has had a bowel movement. The change was approved by the [place] and the Medical Executive Committee.   This patient has had a bowel movement documented by nursing. Therefore, alvimopan has been discontinued. If there are questions, please contact the pharmacy at [phone].  Thank you   Reuel Boom, PharmD, BCPS Pager: [phone] 07/02/2015, 2:00 PM

## 2015-07-02 NOTE — Clinical Documentation Improvement (Signed)
General Surgery Please update your documentation within the medical record to reflect your response to this query. Thank you  Can the diagnosis of anemia be further specified?   Acute Blood Loss Anemia  Iron deficiency Anemia  Nutritional anemia, including the nutrition or mineral deficits  Chronic Anemia, including the suspected or known cause  Anemia of chronic disease, including the associated chronic disease state  Other  Clinically Undetermined  Document any associated diagnoses/conditions.  Supporting Information: 07/02/15 progr note..."11. Anemia  Hgb - 8.1 - 07/02/2015" 06/30/15 Op note..."ESTIMATED BLOOD LOSS: 250 mL." Results for MIKYRA, NIVENS (MRN [id number]) as of 07/02/2015 15:44  06/30/2015 04:44 07/01/2015 04:47 07/02/2015 06:00  Hemoglobin 11.2 (L) 8.9 (L) 8.1 (L)  HCT 34.8 (L) 27.2 (L) 25.0 (L)   Please exercise your independent, professional judgment when responding. A specific answer is not anticipated or expected.  Thank You,  Ermelinda Das, RN, BSN, [place] Certified Clinical Documentation Specialist [place]: Health Information Management [phone]

## 2015-07-03 LAB — CBC WITH DIFFERENTIAL/PLATELET
BASOS ABS: 0 10*3/uL (ref 0.0–0.1)
BASOS PCT: 0 %
EOS ABS: 0.3 10*3/uL (ref 0.0–0.7)
EOS PCT: 5 %
HCT: 25.8 % — ABNORMAL LOW (ref 36.0–46.0)
Hemoglobin: 8.4 g/dL — ABNORMAL LOW (ref 12.0–15.0)
LYMPHS ABS: 1.2 10*3/uL (ref 0.7–4.0)
Lymphocytes Relative: 21 %
MCH: 23.7 pg — AB (ref 26.0–34.0)
MCHC: 32.6 g/dL (ref 30.0–36.0)
MCV: 72.9 fL — ABNORMAL LOW (ref 78.0–100.0)
Monocytes Absolute: 0.4 10*3/uL (ref 0.1–1.0)
Monocytes Relative: 7 %
Neutro Abs: 3.8 10*3/uL (ref 1.7–7.7)
Neutrophils Relative %: 67 %
PLATELETS: 352 10*3/uL (ref 150–400)
RBC: 3.54 MIL/uL — AB (ref 3.87–5.11)
RDW: 16.4 % — ABNORMAL HIGH (ref 11.5–15.5)
WBC: 5.6 10*3/uL (ref 4.0–10.5)

## 2015-07-03 NOTE — Progress Notes (Signed)
Patient ID: Crystal Brennan, female   DOB: 05-06-1965, 50 y.o.   MRN: [id number] 4 Days Post-Op  Subjective: Feels gradually a little bit better. Incision very sore. Is passing gas and having some loose stools. No blood. Just starting full liquids.  Objective: Vital signs in last 24 hours: Temp:  [97.7 F (36.5 C)-98.7 F (37.1 C)] 97.7 F (36.5 C) (05/20 0310) Pulse Rate:  [75-93] 83 (05/20 0310) Resp:  [16-20] 17 (05/20 0842) BP: (141-151)/(74-93) 143/82 mmHg (05/20 0310) SpO2:  [98 %-100 %] 100 % (05/20 0842) Last BM Date: 07/03/15  Intake/Output from previous day: 05/19 0701 - 05/20 0700 In: 2513.3 [P.O.:720; I.V.:1793.3] Out: 1850 [Urine:1850] Intake/Output this shift:    General appearance: alert, cooperative and no distress Resp: no wheezing or increased work of breathing GI: abnormal findings: Mild appropriate tenderness. Obese. Incision/Wound: Clean and dry  Lab Results:   Recent Labs  07/02/15 0600 07/03/15 0500  WBC 6.1 5.6  HGB 8.1* 8.4*  HCT 25.0* 25.8*  PLT 330 352   BMET  Recent Labs  07/01/15 0447 07/02/15 0600  NA 136 135  K 3.9 3.7  CL 107 105  CO2 26 24  GLUCOSE 92 71  BUN 7 8  CREATININE 0.63 0.53  CALCIUM 8.2* 8.3*     Studies/Results: No results found.  Anti-infectives: Anti-infectives    Start     Dose/Rate Route Frequency Ordered Stop   06/29/15 1045  cefoTEtan (CEFOTAN) 2 g in dextrose 5 % 50 mL IVPB     2 g 100 mL/hr over 30 Minutes Intravenous On call to O.R. 06/29/15 1030 06/29/15 1342   06/29/15 1045  cefoTEtan (CEFOTAN) 2 g in dextrose 5 % 50 mL IVPB  Status:  Discontinued     2 g 100 mL/hr over 30 Minutes Intravenous On call to O.R. 06/29/15 1030 06/29/15 1032      Assessment/Plan: s/p Procedure(s):  LAPAROSCOPIC ASSISTED SIGMOID COLECTOMY WITH MOBILIZATION OF SPLENIC  FLEXURE Stable postoperatively without apparent complication. Ambulation encouraged. Just starting full liquid diet and we will leave on this  today. Blood loss  Anemia and Jehovah's Witness. Hemoglobin up slightly today.  Anticoagulation on hold for this reason.   LOS: 4 days    Zylan Almquist T 07/03/2015

## 2015-07-03 NOTE — Progress Notes (Signed)
Pt complained of soreness/tenderness in right upper arm.  There is a spot that is swollen, no redness.  PICC able to flush easily but I was unable to get blood return.  Asked IV team to take a look.  IV team was able to easily get blood return.  No extraneous pain with NS flushes.  Pt states she was laying on that arm and may be a sore muscle.  Heat and pain medication given.  Will continue to monitor.    Roselind Rily

## 2015-07-04 MED ORDER — MORPHINE SULFATE (PF) 2 MG/ML IV SOLN: 2.0000 mg | INTRAVENOUS | Status: DC | PRN

## 2015-07-04 NOTE — Progress Notes (Signed)
Patient ID: Crystal Brennan, female   DOB: 17-Feb-1965, 50 y.o.   MRN: [id number] 5 Days Post-Op  Subjective: Having some pain around the incision and some intermittent upper abdominal pressure-like pain. Passing gas and having bowel movements.  Tolerating full liquids without nausea. Has been walking frequently in the halls.  Objective: Vital signs in last 24 hours: Temp:  [98.2 F (36.8 C)-99.2 F (37.3 C)] 98.3 F (36.8 C) (05/21 0623) Pulse Rate:  [74-89] 87 (05/21 0623) Resp:  [16-23] 20 (05/21 0623) BP: (108-127)/(66-72) 108/70 mmHg (05/21 0623) SpO2:  [99 %-100 %] 100 % (05/21 0623) FiO2 (%):  [38 %] 38 % (05/20 2000) Last BM Date: 07/03/15  Intake/Output from previous day: 05/20 0701 - 05/21 0700 In: 3660 [P.O.:1560; I.V.:2100] Out: [id number] [Urine:1650] Intake/Output this shift:    General appearance: alert, cooperative and no distress Resp: no wheezing or increased work of breathing GI: generally soft and nontender. Some tenderness around the incision. Incision/Wound: slight serous drainage on bandage. No erythema  Lab Results:   Recent Labs  07/02/15 0600 07/03/15 0500  WBC 6.1 5.6  HGB 8.1* 8.4*  HCT 25.0* 25.8*  PLT 330 352   BMET  Recent Labs  07/02/15 0600  NA 135  K 3.7  CL 105  CO2 24  GLUCOSE 71  BUN 8  CREATININE 0.53  CALCIUM 8.3*     Studies/Results: No results found.  Anti-infectives: Anti-infectives    Start     Dose/Rate Route Frequency Ordered Stop   06/29/15 1045  cefoTEtan (CEFOTAN) 2 g in dextrose 5 % 50 mL IVPB     2 g 100 mL/hr over 30 Minutes Intravenous On call to O.R. 06/29/15 1030 06/29/15 1342   06/29/15 1045  cefoTEtan (CEFOTAN) 2 g in dextrose 5 % 50 mL IVPB  Status:  Discontinued     2 g 100 mL/hr over 30 Minutes Intravenous On call to O.R. 06/29/15 1030 06/29/15 1032      Assessment/Plan: s/p Procedure(s):  LAPAROSCOPIC ASSISTED SIGMOID COLECTOMY WITH MOBILIZATION OF SPLENIC  FLEXURE Appears to be doing well  without complication. Has return of bowel function. Advance to regular diet. Transition to oral pain medications. Possibly home tomorrow.   LOS: 5 days    Dina Mobley T 07/04/2015

## 2015-07-05 MED ORDER — HYDROCODONE-ACETAMINOPHEN 5-325 MG PO TABS: 1.0000 | ORAL_TABLET | Freq: Four times a day (QID) | ORAL | Status: DC | PRN

## 2015-07-05 MED ORDER — ALPRAZOLAM 0.5 MG PO TABS: 0.5000 mg | ORAL_TABLET | Freq: Two times a day (BID) | ORAL | Status: DC | PRN

## 2015-07-05 NOTE — Anesthesia Postprocedure Evaluation (Signed)
Anesthesia Post Note  Patient: Crystal Brennan  Procedure(s) Performed: Procedure(s) (LRB):  LAPAROSCOPIC ASSISTED SIGMOID COLECTOMY WITH MOBILIZATION OF SPLENIC  FLEXURE (N/A)  Patient location during evaluation: PACU Anesthesia Type: General Level of consciousness: awake and alert Pain management: pain level controlled Vital Signs Assessment: post-procedure vital signs reviewed and stable Respiratory status: spontaneous breathing, nonlabored ventilation, respiratory function stable and patient connected to nasal cannula oxygen Cardiovascular status: blood pressure returned to baseline and stable Postop Assessment: no signs of nausea or vomiting Anesthetic complications: no     Last Vitals:  Filed Vitals:   07/04/15 1800 07/04/15 2220  BP: 133/75 154/92  Pulse: 81 89  Temp: 36.9 C 37.1 C  Resp: 18 18    Last Pain:  Filed Vitals:   07/05/15 1251  PainSc: 7    Pain Goal: Patients Stated Pain Goal: 4 (07/05/15 0418)               Tiajuana Amass

## 2015-07-05 NOTE — Discharge Summary (Signed)
Physician Discharge Summary  Patient ID:  Crystal Brennan  MRN: [id number]  DOB/AGE: 1966/01/27 50 y.o.  Admit date: 06/29/2015 Discharge date: 07/05/2015  Discharge Diagnoses:  1.  Diverticulitis with colo-colonic fistula  2. History of A. Fib Followed by Dr. Thresa Ross. She is not in A fib at this time 3. Anticoagulated on Eliquis Will restart tomorrow 4. HTN 5. Morbid obesity - BMI 46.8 6. OSA - seen by Dr. Elsworth Soho in 05/21/2014 7. History of depression/anxiety 8. Jehovah's witness - refuses blood transfusion 9. Anemia, combination of blood loss anemia and chronic anemia Hgb - 8.4 - 07/03/2015   Active Problems:   Diverticulitis of colon   Operation: Procedure(s):  LAPAROSCOPIC ASSISTED SIGMOID COLECTOMY WITH MOBILIZATION OF SPLENIC  FLEXURE on 06/29/2015 - [facility]  Discharged Condition: good  Hospital Course: Crystal Brennan is an 50 y.o. female whose primary care physician is Eloise Levels, NP and who was admitted 06/29/2015 with a chief complaint of recurrent diverticulitis.    I tried to get her to a colonoscopy with Dr. Collene Mares before surgery, but we could never get her symptoms stable enough to do this.  So we will proceed with her resection now and plans for a colonoscopy at a later date.  Also her surgery was delayed about 2 weeks, because she was taking her Eliquis too close to the operative date.  She was brought to the operating room on 06/29/2015 and underwent  LAPAROSCOPIC ASSISTED SIGMOID COLECTOMY WITH MOBILIZATION OF SPLENIC  FLEXURE.  Post op she did well.  I did leave an NGT and foley in for 24 hours.  Her post labs did show a decrease of her Hgb to a low of 8.1 on 10/02/2015.  I held her anticoagulation because of this.  Her final pathology showed diverticulitis. She is now 6 days post op.  She is still having some pain when she eats, but she is keeping liquids down well.  She is having BM's.  And her incisions  look good. I removed the staples from the trocar site incisions. The Ativan has helped her post op and I will send her home on some Xanax.  The discharge instructions were reviewed with the patient.  Consults: None  Significant Diagnostic Studies: Results for orders placed or performed during the hospital encounter of 06/29/15  CBC WITH DIFFERENTIAL  Result Value Ref Range   WBC 8.4 4.0 - 10.5 K/uL   RBC 4.74 3.87 - 5.11 MIL/uL   Hemoglobin 11.0 (L) 12.0 - 15.0 g/dL   HCT 34.4 (L) 36.0 - 46.0 %   MCV 72.6 (L) 78.0 - 100.0 fL   MCH 23.2 (L) 26.0 - 34.0 pg   MCHC 32.0 30.0 - 36.0 g/dL   RDW 16.5 (H) 11.5 - 15.5 %   Platelets 412 (H) 150 - 400 K/uL   Neutrophils Relative % 66 %   Neutro Abs 5.5 1.7 - 7.7 K/uL   Lymphocytes Relative 23 %   Lymphs Abs 2.0 0.7 - 4.0 K/uL   Monocytes Relative 8 %   Monocytes Absolute 0.7 0.1 - 1.0 K/uL   Eosinophils Relative 3 %   Eosinophils Absolute 0.2 0.0 - 0.7 K/uL   Basophils Relative 0 %   Basophils Absolute 0.0 0.0 - 0.1 K/uL  Protime-INR  Result Value Ref Range   Prothrombin Time 14.8 11.6 - 15.2 seconds   INR 1.14 0.00 - 1.49  Comprehensive metabolic panel  Result Value Ref Range   Sodium 136 135 - 145  mmol/L   Potassium 4.4 3.5 - 5.1 mmol/L   Chloride 110 101 - 111 mmol/L   CO2 19 (L) 22 - 32 mmol/L   Glucose, Bld 74 65 - 99 mg/dL   BUN 6 6 - 20 mg/dL   Creatinine, Ser 0.56 0.44 - 1.00 mg/dL   Calcium 8.7 (L) 8.9 - 10.3 mg/dL   Total Protein 7.0 6.5 - 8.1 g/dL   Albumin 3.6 3.5 - 5.0 g/dL   AST 21 15 - 41 U/L   ALT 14 14 - 54 U/L   Alkaline Phosphatase 49 38 - 126 U/L   Total Bilirubin 0.7 0.3 - 1.2 mg/dL   GFR calc non Af Amer >60 >60 mL/min   GFR calc Af Amer >60 >60 mL/min   Anion gap 7 5 - 15  Basic metabolic panel  Result Value Ref Range   Sodium 135 135 - 145 mmol/L   Potassium 4.5 3.5 - 5.1 mmol/L   Chloride 109 101 - 111 mmol/L   CO2 20 (L) 22 - 32 mmol/L   Glucose, Bld 135 (H) 65 - 99 mg/dL   BUN 7 6 - 20  mg/dL   Creatinine, Ser 0.69 0.44 - 1.00 mg/dL   Calcium 8.5 (L) 8.9 - 10.3 mg/dL   GFR calc non Af Amer >60 >60 mL/min   GFR calc Af Amer >60 >60 mL/min   Anion gap 6 5 - 15  CBC  Result Value Ref Range   WBC 11.1 (H) 4.0 - 10.5 K/uL   RBC 4.74 3.87 - 5.11 MIL/uL   Hemoglobin 11.2 (L) 12.0 - 15.0 g/dL   HCT 34.8 (L) 36.0 - 46.0 %   MCV 73.4 (L) 78.0 - 100.0 fL   MCH 23.6 (L) 26.0 - 34.0 pg   MCHC 32.2 30.0 - 36.0 g/dL   RDW 16.6 (H) 11.5 - 15.5 %   Platelets 456 (H) 150 - 400 K/uL  CBC with Differential/Platelet  Result Value Ref Range   WBC 8.0 4.0 - 10.5 K/uL   RBC 3.71 (L) 3.87 - 5.11 MIL/uL   Hemoglobin 8.9 (L) 12.0 - 15.0 g/dL   HCT 27.2 (L) 36.0 - 46.0 %   MCV 73.3 (L) 78.0 - 100.0 fL   MCH 24.0 (L) 26.0 - 34.0 pg   MCHC 32.7 30.0 - 36.0 g/dL   RDW 16.6 (H) 11.5 - 15.5 %   Platelets 378 150 - 400 K/uL   Neutrophils Relative % 68 %   Neutro Abs 5.4 1.7 - 7.7 K/uL   Lymphocytes Relative 20 %   Lymphs Abs 1.6 0.7 - 4.0 K/uL   Monocytes Relative 11 %   Monocytes Absolute 0.9 0.1 - 1.0 K/uL   Eosinophils Relative 1 %   Eosinophils Absolute 0.1 0.0 - 0.7 K/uL   Basophils Relative 0 %   Basophils Absolute 0.0 0.0 - 0.1 K/uL  Basic metabolic panel  Result Value Ref Range   Sodium 136 135 - 145 mmol/L   Potassium 3.9 3.5 - 5.1 mmol/L   Chloride 107 101 - 111 mmol/L   CO2 26 22 - 32 mmol/L   Glucose, Bld 92 65 - 99 mg/dL   BUN 7 6 - 20 mg/dL   Creatinine, Ser 0.63 0.44 - 1.00 mg/dL   Calcium 8.2 (L) 8.9 - 10.3 mg/dL   GFR calc non Af Amer >60 >60 mL/min   GFR calc Af Amer >60 >60 mL/min   Anion gap 3 (L) 5 - 15  CBC  with Differential/Platelet  Result Value Ref Range   WBC 6.1 4.0 - 10.5 K/uL   RBC 3.47 (L) 3.87 - 5.11 MIL/uL   Hemoglobin 8.1 (L) 12.0 - 15.0 g/dL   HCT 25.0 (L) 36.0 - 46.0 %   MCV 72.0 (L) 78.0 - 100.0 fL   MCH 23.3 (L) 26.0 - 34.0 pg   MCHC 32.4 30.0 - 36.0 g/dL   RDW 16.6 (H) 11.5 - 15.5 %   Platelets 330 150 - 400 K/uL   Neutrophils  Relative % 67 %   Neutro Abs 4.1 1.7 - 7.7 K/uL   Lymphocytes Relative 22 %   Lymphs Abs 1.3 0.7 - 4.0 K/uL   Monocytes Relative 7 %   Monocytes Absolute 0.5 0.1 - 1.0 K/uL   Eosinophils Relative 4 %   Eosinophils Absolute 0.2 0.0 - 0.7 K/uL   Basophils Relative 0 %   Basophils Absolute 0.0 0.0 - 0.1 K/uL  Basic metabolic panel  Result Value Ref Range   Sodium 135 135 - 145 mmol/L   Potassium 3.7 3.5 - 5.1 mmol/L   Chloride 105 101 - 111 mmol/L   CO2 24 22 - 32 mmol/L   Glucose, Bld 71 65 - 99 mg/dL   BUN 8 6 - 20 mg/dL   Creatinine, Ser 0.53 0.44 - 1.00 mg/dL   Calcium 8.3 (L) 8.9 - 10.3 mg/dL   GFR calc non Af Amer >60 >60 mL/min   GFR calc Af Amer >60 >60 mL/min   Anion gap 6 5 - 15  CBC with Differential/Platelet  Result Value Ref Range   WBC 5.6 4.0 - 10.5 K/uL   RBC 3.54 (L) 3.87 - 5.11 MIL/uL   Hemoglobin 8.4 (L) 12.0 - 15.0 g/dL   HCT 25.8 (L) 36.0 - 46.0 %   MCV 72.9 (L) 78.0 - 100.0 fL   MCH 23.7 (L) 26.0 - 34.0 pg   MCHC 32.6 30.0 - 36.0 g/dL   RDW 16.4 (H) 11.5 - 15.5 %   Platelets 352 150 - 400 K/uL   Neutrophils Relative % 67 %   Neutro Abs 3.8 1.7 - 7.7 K/uL   Lymphocytes Relative 21 %   Lymphs Abs 1.2 0.7 - 4.0 K/uL   Monocytes Relative 7 %   Monocytes Absolute 0.4 0.1 - 1.0 K/uL   Eosinophils Relative 5 %   Eosinophils Absolute 0.3 0.0 - 0.7 K/uL   Basophils Relative 0 %   Basophils Absolute 0.0 0.0 - 0.1 K/uL    No results found.  Discharge Exam:  Filed Vitals:   07/04/15 1800 07/04/15 2220  BP: 133/75 154/92  Pulse: 81 89  Temp: 98.5 F (36.9 C) 98.8 F (37.1 C)  Resp: 18 18    General: Obese AA F who is alert and generally healthy appearing.  Lungs: Clear to auscultation and symmetric breath sounds. Heart:  RRR. No murmur or rub. Abdomen: Soft. No mass.  Normal bowel sounds. Her incisions look good.  Discharge Medications:     Medication List    ASK your doctor about these medications        ALEVE 220 MG tablet  Generic drug:   naproxen sodium  Take 880 mg by mouth every 8 (eight) hours as needed (For pain.).     apixaban 5 MG Tabs tablet  Commonly known as:  ELIQUIS  Take 1 tablet (5 mg total) by mouth 2 (two) times daily.     ciprofloxacin 500 MG tablet  Commonly known as:  CIPRO  Take 1 tablet (500 mg total) by mouth 2 (two) times daily.     diltiazem 180 MG 24 hr capsule  Commonly known as:  CARDIZEM CD  Take 1 capsule (180 mg total) by mouth daily.     EVENING PRIMROSE OIL PO  Take 1 capsule by mouth daily as needed (For anxiety.).     HYDROcodone-acetaminophen 5-325 MG tablet  Commonly known as:  NORCO/VICODIN  Take 1-2 tablets by mouth every 4 (four) hours as needed for moderate pain.     loratadine 10 MG tablet  Commonly known as:  CLARITIN  Take 10 mg by mouth daily as needed for allergies.     metoprolol 50 MG tablet  Commonly known as:  LOPRESSOR  Take 1 tablet (50 mg total) by mouth 2 (two) times daily.     metroNIDAZOLE 500 MG tablet  Commonly known as:  FLAGYL  Take 1 tablet (500 mg total) by mouth 3 (three) times daily.     neomycin 500 MG tablet  Commonly known as:  MYCIFRADIN  Take 1,000 mg by mouth See admin instructions. She is to take at 2pm, 3pm and 10pm the day prior to surgery.     ondansetron 4 MG tablet  Commonly known as:  ZOFRAN  Take 1 tablet (4 mg total) by mouth every 8 (eight) hours as needed for nausea or vomiting.     oxyCODONE 5 MG immediate release tablet  Commonly known as:  ROXICODONE  Take 1 tablet (5 mg total) by mouth every 4 (four) hours as needed for severe pain.     tetrahydrozoline 0.05 % ophthalmic solution  Place 2 drops into both eyes 4 (four) times daily as needed (For eye irritation.).     zolpidem 5 MG tablet  Commonly known as:  AMBIEN  Take 1 tablet (5 mg total) by mouth at bedtime as needed for sleep.        Disposition: 01-Home or Self Care   Activity:  Driving - May drive in 4 or 5 days   Lifting - No lifting more than 15  pounds x 4 weeks  Wound Care:   May shower  Diet:  As tolerated  Follow up appointment:  Call Dr. Pollie Friar office [facility]) at [phone] for an appointment in 1 to 2 weeks.  Medications and dosages:  Resume your home medications.  You have a prescription for:  Vicodin and Xanax  Signed: Alphonsa Overall, M.D., [facility] Office:  [phone]  07/05/2015, 9:39 AM

## 2015-07-05 NOTE — Progress Notes (Signed)
RN reviewed discharge instructions with patient and family. All questions answered.   Paperwork and prescriptions given.   NT rolled patient down in wheelchair with all belongings to family car.    

## 2015-07-05 NOTE — Discharge Instructions (Signed)
[facility] - DISCHARGE INSTRUCTIONS TO PATIENT  Activity:  Driving - May drive in 4 or 5 days   Lifting - No lifting more than 15 pounds x 4 weeks  Wound Care:   May shower  Diet:  As tolerated  Follow up appointment:  Call Dr. Pollie Friar office [facility]) at [phone] for an appointment in 1 to 2 weeks.  Medications and dosages:  Resume your home medications.  You have a prescription for:  Vicodin and Xanax  Call Dr. Lucia Gaskins or his office  [phone]) if you have:  Temperature greater than 100.4,  Persistent nausea and vomiting,  Severe uncontrolled pain,  Redness, tenderness, or signs of infection (pain, swelling, redness, odor or green/yellow discharge around the site),  Difficulty breathing, headache or visual disturbances,  Any other questions or concerns you may have after discharge.  In an emergency, call 911 or go to an Emergency Department at a nearby hospital.

## 2015-09-09 ENCOUNTER — Encounter (HOSPITAL_COMMUNITY): Payer: Self-pay

## 2015-11-18 ENCOUNTER — Telehealth: Payer: Self-pay | Admitting: Cardiology

## 2015-11-18 NOTE — Telephone Encounter (Signed)
Patient calling the office for samples of medication:   1.  What medication and dosage are you requesting samples for? Eliquis    2.  Are you currently out of this medication? No, 4 days left   Pt mother Crystal Brennan will pick them up

## 2015-11-19 NOTE — Telephone Encounter (Signed)
Pt requested to have Barton Dubois (mother) pick up samples and informed her these are ready for pickup.  I have also included a patient assistance application which she's been instructed to fill out.

## 2015-11-19 NOTE — Telephone Encounter (Signed)
F/u   Pt mother will pick up the samples today before closing. Pt will not be able to be reached via phone.

## 2015-12-20 ENCOUNTER — Telehealth: Payer: Self-pay | Admitting: Cardiology

## 2015-12-20 NOTE — Telephone Encounter (Signed)
New message       Patient calling the office for samples of medication:   1.  What medication and dosage are you requesting samples for?  eliquis 5mg    2.  Are you currently out of this medication?  Pt has 5 days of medication left.  Also, pt's mother died last 06-11-2022 and she does not have any money

## 2015-12-22 NOTE — Telephone Encounter (Signed)
Spoke with patient and she is aware that I will place some eliquis samples at the front desk at the [address] office.

## 2015-12-22 NOTE — Telephone Encounter (Signed)
No samples on-hand at present.

## 2015-12-22 NOTE — Telephone Encounter (Signed)
Called patient, goes straight to VM. Left msg to state no samples currently in stock at [place] office. Will route to [facility] to see if any available. I believe we have pursued financial assistance options for this patient but I'm unsure of status of this. She may need to get an application for [place] patient assistance.

## 2015-12-24 ENCOUNTER — Ambulatory Visit: Payer: Commercial Managed Care - PPO | Admitting: Cardiology

## 2015-12-30 ENCOUNTER — Encounter: Payer: Self-pay | Admitting: Nurse Practitioner

## 2015-12-30 ENCOUNTER — Ambulatory Visit (INDEPENDENT_AMBULATORY_CARE_PROVIDER_SITE_OTHER): Payer: Self-pay | Admitting: Nurse Practitioner

## 2015-12-30 VITALS — BP 116/73 | HR 78 | Ht 62.0 in | Wt 235.2 lb

## 2015-12-30 DIAGNOSIS — I10 Essential (primary) hypertension: Secondary | ICD-10-CM

## 2015-12-30 DIAGNOSIS — I480 Paroxysmal atrial fibrillation: Secondary | ICD-10-CM

## 2015-12-30 MED ORDER — DILTIAZEM HCL ER COATED BEADS 180 MG PO CP24: 180.0000 mg | ORAL_CAPSULE | Freq: Every day | ORAL | 11 refills | Status: DC

## 2015-12-30 MED ORDER — METOPROLOL TARTRATE 50 MG PO TABS: 50.0000 mg | ORAL_TABLET | Freq: Two times a day (BID) | ORAL | 11 refills | Status: DC

## 2015-12-30 MED ORDER — APIXABAN 5 MG PO TABS: 5.0000 mg | ORAL_TABLET | Freq: Two times a day (BID) | ORAL | 3 refills | Status: DC

## 2015-12-30 NOTE — Progress Notes (Signed)
Office Visit    Patient Name: Crystal Brennan Date of Encounter: 12/30/2015  Primary Care Provider:  Eloise Levels, NP Primary Cardiologist:  B. Stanford Breed, MD   Chief Complaint    50 y/o ? with a h/o PAF, HTN, obesity, and OSA, who presents for f/u.  Past Medical History    Past Medical History:  Diagnosis Date  . Anemia   . Anxiety   . Arthritis    shoulders   . Diastolic dysfunction    a. 12/2013 Echo: EF 65-70%, no rwma, Gr1 DD, PASP 45mmHg.  . Diverticulitis 2015  . Family history of adverse reaction to anesthesia    mother wakes up during surgery   . H/O cardiovascular stress test    a. 12/2013 MV: no ischemia/infarct, prominent breast attenuation.  . Hypertension   . PAF (paroxysmal atrial fibrillation) ([place])    a. 12/2013 PAF->TSH nl->converted with Dilt;  b. CHA2DS2VASc = 2-->Eliquis.  . Sleep apnea    cpap - does not know settings    Past Surgical History:  Procedure Laterality Date  . [place]; 1986; 1988  . CHOLECYSTECTOMY  2009  . COLON RESECTION N/A 06/29/2015   Procedure:  LAPAROSCOPIC ASSISTED SIGMOID COLECTOMY WITH MOBILIZATION OF SPLENIC  FLEXURE;  Surgeon: Alphonsa Overall, MD;  Location: [facility];  Service: General;  Laterality: N/A;  . cyst removed from right ovary      age 47   . TUBAL LIGATION  198    Allergies  Allergies  Allergen Reactions  . Benadryl [Diphenhydramine Hcl] Hives and Itching  . Codeine Nausea And Vomiting  . Dilaudid [Hydromorphone Hcl] Other (See Comments)    "feels like she dying '  . Other     BLOOD PRODUCT REFUSAL   . Percocet [Oxycodone-Acetaminophen] Hives and Itching    History of Present Illness    50 y/o ? with a h/o HTN, OSA, and Paroxysmal AF, which was dx in 12/2013.  At the time, she underwent echo and stress testing, which were nl.  She has since been on oral dilt therapy w/o recurrence of Afib.  She is anticoagulated with eliquis but is not able to afford it and thus relies heavily on samples.   Her mother recently passed following a prolonged hospitalization with diverticulitis requiring surgery and complicated by pelvic hematoma.  She was later d/c'd to rehab in good condition but died after just two days there, presumably suddenly.  In that setting, [name] has had significant emotional distress and upset, but has otherwise done reasonably well w/o chest pain, dyspnea, palpitations, pnd, orthopnea, n, v, dizziness, syncope, edema, or early satiety.  Home Medications    Prior to Admission medications   Medication Sig Start Date End Date Taking? Authorizing Provider  apixaban (ELIQUIS) 5 MG TABS tablet Take 1 tablet (5 mg total) by mouth 2 (two) times daily. 12/30/15  Yes Lelon Perla, MD  diltiazem (CARDIZEM CD) 180 MG 24 hr capsule Take 1 capsule (180 mg total) by mouth daily. 12/30/15  Yes Rogelia Mire, NP  EVENING PRIMROSE OIL PO Take 1 capsule by mouth daily as needed (For anxiety.).   Yes Historical Provider, MD  loratadine (CLARITIN) 10 MG tablet Take 10 mg by mouth daily as needed for allergies.    Yes Historical Provider, MD  metoprolol (LOPRESSOR) 50 MG tablet Take 1 tablet (50 mg total) by mouth 2 (two) times daily. 12/30/15  Yes Rogelia Mire, NP  zolpidem (AMBIEN) 5  MG tablet Take 1 tablet (5 mg total) by mouth at bedtime as needed for sleep. 03/06/15  Yes Robbie Lis, MD    Review of Systems    She has been under a great deal of emotional distress following her mothers recent death but denies chest pain, palpitations, dyspnea, pnd, orthopnea, n, v, dizziness, syncope, edema, weight gain, or early satiety.  All other systems reviewed and are otherwise negative except as noted above.  Physical Exam    VS:  BP 116/73   Pulse 78   Ht 5\' 2"  (1.575 m)   Wt 235 lb 3.2 oz (106.7 kg)   BMI 43.02 kg/m  , BMI Body mass index is 43.02 kg/m. GEN: Well nourished, well developed, in no acute distress.  HEENT: normal.  Neck: Supple, no JVD, carotid bruits,  or masses. Cardiac: RRR, no murmurs, rubs, or gallops. No clubbing, cyanosis, edema.  Radials/DP/PT 2+ and equal bilaterally.  Respiratory:  Respirations regular and unlabored, clear to auscultation bilaterally. GI: Soft, nontender, nondistended, BS + x 4. MS: no deformity or atrophy. Skin: warm and dry, no rash. Neuro:  Strength and sensation are intact. Psych: Normal affect.  Accessory Clinical Findings    ECG - rsr, 78, no acute st/t changes.  Assessment & Plan    1.  Paroxysmal Afib:  She has been doing well w/o recurrent palpitations.  She did run out of dilt recently and we will refill this today.  She also needs eliquis samples as she does not have insurance.  We have filled out and provided her with ppwk for the eliquis assistance program. She says that she had been on that before.  She will resubmit.  If she is no longer a candidate for that program, we will likely need to switch to xarelto and try and enroll her in their program.  She was anemic the last time she had a cbc in May and thus I will reorder today (she told nsg staff during checkout that she may not get the test if it is cost prohibitive).  2.  HTN:  Stable on dilt.  3.  Dispo:  F/u cbc today and six months.  F/u Dr. Stanford Breed in 1 yr.   Murray Hodgkins, NP 12/30/2015, 4:54 PM

## 2015-12-30 NOTE — Patient Instructions (Signed)
Medication Instructions: Ignacia Bayley, NP recommends that you continue on your current medications as directed. Please refer to the Current Medication list given to you today.  Labwork: Your physician recommends that you return for lab work TODAY and in 6 months.  Testing/Procedures:  Follow-up: Gerald Stabs recommends that you schedule a follow-up appointment in 1 year with Dr Stanford Breed. You will receive a reminder letter in the mail two months in advance. If you don't receive a letter, please call our office to schedule the follow-up appointment.  If you need a refill on your cardiac medications before your next appointment, please call your pharmacy.

## 2016-02-15 ENCOUNTER — Telehealth: Payer: Self-pay | Admitting: Nurse Practitioner

## 2016-02-15 NOTE — Telephone Encounter (Signed)
Patient calling the office for samples of medication:   1.  What medication and dosage are you requesting samples for? ELIQUIS   2.  Are you currently out of this medication? 3 DAYS LEFT

## 2016-02-15 NOTE — Telephone Encounter (Signed)
Patient aware samples are at the front desk for pick up  

## 2016-02-18 ENCOUNTER — Ambulatory Visit: Payer: Self-pay | Admitting: Cardiology

## 2016-03-23 ENCOUNTER — Telehealth: Payer: Self-pay | Admitting: Cardiology

## 2016-03-23 NOTE — Telephone Encounter (Signed)
Returned call to patient left message on personal voice mail Eliquis 5 mg samples left at front desk of [place] office. 

## 2016-03-23 NOTE — Telephone Encounter (Signed)
Patient calling the office for samples of medication:   1.  What medication and dosage are you requesting samples for? Crystal Brennan  2.  Are you currently out of this medication? Need samples until her insurance kicks in please

## 2016-03-28 ENCOUNTER — Emergency Department (HOSPITAL_COMMUNITY): Payer: Self-pay

## 2016-03-28 ENCOUNTER — Emergency Department (HOSPITAL_COMMUNITY)
Admission: EM | Admit: 2016-03-28 | Discharge: 2016-03-28 | Disposition: A | Discharge status: Home / Self Care | Payer: Self-pay | Attending: Emergency Medicine | Admitting: Emergency Medicine

## 2016-03-28 ENCOUNTER — Encounter (HOSPITAL_COMMUNITY): Payer: Self-pay

## 2016-03-28 DIAGNOSIS — Z87891 Personal history of nicotine dependence: Secondary | ICD-10-CM | POA: Insufficient documentation

## 2016-03-28 DIAGNOSIS — I110 Hypertensive heart disease with heart failure: Secondary | ICD-10-CM | POA: Insufficient documentation

## 2016-03-28 DIAGNOSIS — I5032 Chronic diastolic (congestive) heart failure: Secondary | ICD-10-CM | POA: Insufficient documentation

## 2016-03-28 DIAGNOSIS — J111 Influenza due to unidentified influenza virus with other respiratory manifestations: Secondary | ICD-10-CM | POA: Insufficient documentation

## 2016-03-28 DIAGNOSIS — Z7901 Long term (current) use of anticoagulants: Secondary | ICD-10-CM | POA: Insufficient documentation

## 2016-03-28 DIAGNOSIS — R69 Illness, unspecified: Secondary | ICD-10-CM

## 2016-03-28 MED ORDER — IBUPROFEN 200 MG PO TABS
400.0000 mg | ORAL_TABLET | Freq: Once | ORAL | Status: DC
  Filled 2016-03-28: qty 2

## 2016-03-28 MED ORDER — ACETAMINOPHEN 500 MG PO TABS
1000.0000 mg | ORAL_TABLET | Freq: Once | ORAL | Status: AC
  Administered 2016-03-28: 1000 mg via ORAL
  Filled 2016-03-28: qty 2

## 2016-03-28 NOTE — ED Notes (Signed)
Pt is asking for her work note to excuse her for yesterday's absence as well, dr Ashok Cordia approved.

## 2016-03-28 NOTE — ED Triage Notes (Signed)
Pt complains of chills, fever, sore throat and a cough for about one week

## 2016-03-28 NOTE — Discharge Instructions (Signed)
It was our pleasure to provide your ER care today - we hope that you feel better.  Rest. Drink plenty of fluids.  You may try over the counter cold and flu medication as need for symptom relief.  Follow up with primary care doctor in 1 week if symptoms fail to improve/resolve.  Return to ER if worse, new symptoms, trouble breathing, other concern.

## 2016-03-28 NOTE — ED Provider Notes (Signed)
[facility] DEPT Provider Note   CSN: [id number] Arrival date & time: 03/28/16  [id number]     History   Chief Complaint Chief Complaint  Patient presents with  . URI    HPI Crystal Brennan is a 50 y.o. female.  Patient c/o productive cough, sore throat, body aches, intermittent headache, and fever for the past week. Symptoms persistent. States cough seems to be getting worse, not better. Denies chest pain or sob. No trouble swallowing. Denies abd pain or nvd. No dysuria or gu c/o. Subjective fevers. Pt reports others at work w uri symtpoms. Non smoker. No hx asthma/lng disease.    The history is provided by the patient.  URI   Associated symptoms include congestion and cough. Pertinent negatives include no chest pain, no abdominal pain, no diarrhea, no vomiting, no dysuria, no neck pain and no rash.    Past Medical History:  Diagnosis Date  . Anemia   . Anxiety   . Arthritis    shoulders   . Diastolic dysfunction    a. 12/2013 Echo: EF 65-70%, no rwma, Gr1 DD, PASP 26mmHg.  . Diverticulitis 2015  . Family history of adverse reaction to anesthesia    mother wakes up during surgery   . H/O cardiovascular stress test    a. 12/2013 MV: no ischemia/infarct, prominent breast attenuation.  . Hypertension   . PAF (paroxysmal atrial fibrillation) ([place])    a. 12/2013 PAF->TSH nl->converted with Dilt;  b. CHA2DS2VASc = 2-->Eliquis.  . Sleep apnea    cpap - does not know settings     Patient Active Problem List   Diagnosis Date Noted  . Diverticulitis of colon 06/29/2015  . Preop cardiovascular exam 05/11/2015  . Anemia of chronic disease 03/03/2015  . Thrombocytosis ([place]) 03/03/2015  . Benign essential HTN 03/03/2015  . Morbid obesity due to excess calories ([place]) 03/03/2015  . Pelvic abscess in female   . Hypokalemia 02/20/2015  . Chronic diastolic congestive heart failure, NYHA class 1 ([place]) 02/20/2015  . Diverticulitis of large intestine with perforation without bleeding     . OSA (obstructive sleep apnea) 04/08/2014  . PAF (paroxysmal atrial fibrillation) ([place]) 01/01/2014  . Anxiety     Past Surgical History:  Procedure Laterality Date  . [place] ARB; 1986; 1988  . CHOLECYSTECTOMY  2009  . COLON RESECTION N/A 06/29/2015   Procedure:  LAPAROSCOPIC ASSISTED SIGMOID COLECTOMY WITH MOBILIZATION OF SPLENIC  FLEXURE;  Surgeon: Alphonsa Overall, MD;  Location: [facility];  Service: General;  Laterality: N/A;  . cyst removed from right ovary      age 76   . TUBAL LIGATION  198    OB History    No data available       Home Medications    Prior to Admission medications   Medication Sig Start Date End Date Taking? Authorizing Provider  apixaban (ELIQUIS) 5 MG TABS tablet Take 1 tablet (5 mg total) by mouth 2 (two) times daily. 12/30/15   Lelon Perla, MD  diltiazem (CARDIZEM CD) 180 MG 24 hr capsule Take 1 capsule (180 mg total) by mouth daily. 12/30/15   Rogelia Mire, NP  EVENING PRIMROSE OIL PO Take 1 capsule by mouth daily as needed (For anxiety.).    Historical Provider, MD  loratadine (CLARITIN) 10 MG tablet Take 10 mg by mouth daily as needed for allergies.     Historical Provider, MD  metoprolol (LOPRESSOR) 50 MG tablet Take 1 tablet (50 mg total) by mouth  2 (two) times daily. 12/30/15   Rogelia Mire, NP  zolpidem (AMBIEN) 5 MG tablet Take 1 tablet (5 mg total) by mouth at bedtime as needed for sleep. 03/06/15   Robbie Lis, MD    Family History Family History  Problem Relation Age of Onset  . Heart attack Mother     ? in her 11's - never sought treatment  . Pulmonary embolism Mother   . Other Father     ? hx  . Lupus Sister   . Heart failure Sister     Social History Social History  Substance Use Topics  . Smoking status: Former Smoker    Packs/day: 0.33    Years: 30.00    Types: Cigarettes    Quit date: 01/01/2015  . Smokeless tobacco: Never Used  . Alcohol use No     Allergies   Benadryl [diphenhydramine  hcl]; Codeine; Dilaudid [hydromorphone hcl]; Other; and Percocet [oxycodone-acetaminophen]   Review of Systems Review of Systems  Constitutional: Positive for fever.  HENT: Positive for congestion.   Eyes: Negative for redness.  Respiratory: Positive for cough. Negative for shortness of breath.   Cardiovascular: Negative for chest pain.  Gastrointestinal: Negative for abdominal pain, diarrhea and vomiting.  Genitourinary: Negative for dysuria.  Musculoskeletal: Negative for back pain, neck pain and neck stiffness.  Skin: Negative for rash.  Neurological: Negative for weakness and numbness.  Hematological: Does not bruise/bleed easily.  Psychiatric/Behavioral: Negative for confusion.     Physical Exam Updated Vital Signs BP 111/63 (BP Location: Left Arm)   Pulse 79   Temp 97.9 F (36.6 C) (Oral)   Resp 20   LMP 03/28/2016   SpO2 100%   Physical Exam  Constitutional: She appears well-developed and well-nourished. No distress.  HENT:  Mouth/Throat: Oropharynx is clear and moist.  Mild nasal congestion. tms intact, clear fluid behind tm.   Eyes: Conjunctivae are normal. No scleral icterus.  Neck: Neck supple. No tracheal deviation present.  No stiffness or rigidity.   Cardiovascular: Normal rate, regular rhythm, normal heart sounds and intact distal pulses.  Exam reveals no gallop and no friction rub.   No murmur heard. Pulmonary/Chest: Effort normal and breath sounds normal. No respiratory distress.  Abdominal: Soft. Normal appearance and bowel sounds are normal. She exhibits no distension. There is no tenderness.  No hsm.   Genitourinary:  Genitourinary Comments: No cva tenderness  Musculoskeletal: She exhibits no edema.  Lymphadenopathy:    She has no cervical adenopathy.  Neurological: She is alert.  Speech normal. Ambulates w steady gait.   Skin: Skin is warm and dry. No rash noted.  Psychiatric: She has a normal mood and affect.  Nursing note and vitals  reviewed.    ED Treatments / Results  Labs (all labs ordered are listed, but only abnormal results are displayed) Labs Reviewed - No data to display  EKG  EKG Interpretation None       Radiology Dg Chest 2 View  Result Date: 03/28/2016 CLINICAL DATA:  Cough, chest congestion, shortness of breath, and chills for the past week. History of atrial fibrillation, CHF, discontinued smoking 2 years ago. EXAM: CHEST  2 VIEW COMPARISON:  PA and lateral chest x-ray of June 01, 2014 FINDINGS: The lungs are adequately inflated. There is no focal infiltrate. There is no pleural effusion. The heart is top-normal in size. The pulmonary vascularity is normal. The mediastinum is normal in width. The bony thorax exhibits no acute abnormality. IMPRESSION: There is  no pneumonia nor other acute cardiopulmonary disease. The cardiac size is top-normal. Electronically Signed   By: David  Martinique M.D.   On: 03/28/2016 07:41    Procedures Procedures (including critical care time)  Medications Ordered in ED Medications - No data to display   Initial Impression / Assessment and Plan / ED Course  I have reviewed the triage vital signs and the nursing notes.  Pertinent labs & imaging results that were available during my care of the patient were reviewed by me and considered in my medical decision making (see chart for details).  Given worsening cough, productive, will check cxr.  Symptoms felt most c/w ILI - given ongoing for approx 1 week, no indication for tamiflu, rec symptomatic rx.  No meds yet today. Tylenol po, motrin po. Po fluids.   Pt appears stable for d/c.     Final Clinical Impressions(s) / ED Diagnoses   Final diagnoses:  None    New Prescriptions New Prescriptions   No medications on file     Lajean Saver, MD 03/28/16 [phone]

## 2016-05-01 ENCOUNTER — Telehealth: Payer: Self-pay | Admitting: Cardiology

## 2016-05-01 ENCOUNTER — Other Ambulatory Visit: Payer: Self-pay

## 2016-05-01 MED ORDER — APIXABAN 5 MG PO TABS: 5.0000 mg | ORAL_TABLET | Freq: Two times a day (BID) | ORAL | 3 refills | Status: DC

## 2016-05-01 NOTE — Telephone Encounter (Signed)
Spoke with pt, informed that we have no samples she states that she has only 3 pills left and her insurance starts 05-06-16, informed pt of importance of taking pills daily. Pt notified sent rx to [facility] for 90 day-with note that it is ok for # 6 pills.

## 2016-05-01 NOTE — Telephone Encounter (Signed)
Follow up  Pt returning call and pt voiced on her voicemail someone stated another pts name.  Please f/u

## 2016-05-01 NOTE — Telephone Encounter (Signed)
New Message  Patient calling the office for samples of medication:   1.  What medication and dosage are you requesting samples for? Eliquis 5mg  tablets twice daily  2.  Are you currently out of this medication?  Yes

## 2016-05-01 NOTE — Telephone Encounter (Signed)
LM TO CALL BACK ./CY 

## 2016-05-01 NOTE — Telephone Encounter (Signed)
No samples available 

## 2016-06-14 ENCOUNTER — Telehealth: Payer: Self-pay | Admitting: Cardiology

## 2016-06-14 MED ORDER — APIXABAN 5 MG PO TABS: 5.0000 mg | ORAL_TABLET | Freq: Two times a day (BID) | ORAL | 0 refills | Status: DC

## 2016-06-14 NOTE — Telephone Encounter (Signed)
Left message that samples are available at the front desk for pick up

## 2016-06-14 NOTE — Telephone Encounter (Signed)
Patient calling the office for samples of medication:   1.  What medication and dosage are you requesting samples for? Eliquis  2.  Are you currently out of this medication? yes    

## 2016-08-04 ENCOUNTER — Telehealth: Payer: Self-pay | Admitting: Cardiology

## 2016-08-04 NOTE — Telephone Encounter (Signed)
New Message     Very lathargic, and her heart feels like its gonna jump out of her chest, heart racing

## 2016-08-04 NOTE — Telephone Encounter (Signed)
Pt of Dr. Stanford Breed Hx of PAF  C/o fatigue, anxiety, palpitations   Spoke to patient. Notes she had a missed dose of cardizem (she is on this and metoprolol for rate control) yesterday and felt like she went into A Fib yesterday. She voices that today she took meds on time early AM, but still is having palpitations, feels ""real real tired", and voices that her anxiety is "through the roof". Pt tearful and urgently requesting advice. I recommended ER as she is having symptoms and uncertain of her HR or BP.  She has a visit scheduled w Isaac Laud on 6/26 and initially stated a preference to just follow up at that time, but then agreed to ED eval. Pt will go to [facility] now. I have called Trish to inform.

## 2016-08-06 ENCOUNTER — Emergency Department (HOSPITAL_COMMUNITY)
Admission: EM | Admit: 2016-08-06 | Discharge: 2016-08-06 | Disposition: A | Discharge status: Home / Self Care | Payer: Commercial Managed Care - PPO | Attending: Emergency Medicine | Admitting: Emergency Medicine

## 2016-08-06 ENCOUNTER — Encounter (HOSPITAL_COMMUNITY): Payer: Self-pay

## 2016-08-06 ENCOUNTER — Emergency Department (HOSPITAL_COMMUNITY): Payer: Commercial Managed Care - PPO

## 2016-08-06 DIAGNOSIS — I110 Hypertensive heart disease with heart failure: Secondary | ICD-10-CM | POA: Insufficient documentation

## 2016-08-06 DIAGNOSIS — R002 Palpitations: Secondary | ICD-10-CM

## 2016-08-06 DIAGNOSIS — I5032 Chronic diastolic (congestive) heart failure: Secondary | ICD-10-CM | POA: Insufficient documentation

## 2016-08-06 DIAGNOSIS — R000 Tachycardia, unspecified: Secondary | ICD-10-CM | POA: Diagnosis not present

## 2016-08-06 DIAGNOSIS — R739 Hyperglycemia, unspecified: Secondary | ICD-10-CM | POA: Insufficient documentation

## 2016-08-06 DIAGNOSIS — D649 Anemia, unspecified: Secondary | ICD-10-CM | POA: Diagnosis not present

## 2016-08-06 DIAGNOSIS — Z87891 Personal history of nicotine dependence: Secondary | ICD-10-CM | POA: Diagnosis not present

## 2016-08-06 DIAGNOSIS — Z7901 Long term (current) use of anticoagulants: Secondary | ICD-10-CM | POA: Diagnosis not present

## 2016-08-06 DIAGNOSIS — R0789 Other chest pain: Secondary | ICD-10-CM

## 2016-08-06 DIAGNOSIS — F419 Anxiety disorder, unspecified: Secondary | ICD-10-CM | POA: Insufficient documentation

## 2016-08-06 LAB — BASIC METABOLIC PANEL
ANION GAP: 8 (ref 5–15)
BUN: 10 mg/dL (ref 6–20)
CALCIUM: 9 mg/dL (ref 8.9–10.3)
CO2: 20 mmol/L — ABNORMAL LOW (ref 22–32)
CREATININE: 0.87 mg/dL (ref 0.44–1.00)
Chloride: 107 mmol/L (ref 101–111)
GFR calc non Af Amer: >60 mL/min (ref >60)
Glucose, Bld: 180 mg/dL — ABNORMAL HIGH (ref 65–99)
Potassium: 3.7 mmol/L (ref 3.5–5.1)
SODIUM: 135 mmol/L (ref 135–145)

## 2016-08-06 LAB — CBC
HCT: 32.1 % — ABNORMAL LOW (ref 36.0–46.0)
Hemoglobin: 10 g/dL — ABNORMAL LOW (ref 12.0–15.0)
MCH: 21.7 pg — ABNORMAL LOW (ref 26.0–34.0)
MCHC: 31.2 g/dL (ref 30.0–36.0)
MCV: 69.6 fL — ABNORMAL LOW (ref 78.0–100.0)
PLATELETS: 371 10*3/uL (ref 150–400)
RBC: 4.61 MIL/uL (ref 3.87–5.11)
RDW: 18.3 % — ABNORMAL HIGH (ref 11.5–15.5)
WBC: 9.4 10*3/uL (ref 4.0–10.5)

## 2016-08-06 LAB — I-STAT TROPONIN, ED
TROPONIN I, POC: 0 ng/mL (ref 0.00–0.08)
TROPONIN I, POC: 0.01 ng/mL (ref 0.00–0.08)

## 2016-08-06 MED ORDER — ACETAMINOPHEN 325 MG PO TABS
650.0000 mg | ORAL_TABLET | Freq: Once | ORAL | Status: AC
Start: 2016-08-06 — End: 2016-08-06
  Administered 2016-08-06: 650 mg via ORAL
  Filled 2016-08-06: qty 2

## 2016-08-06 MED ORDER — LORAZEPAM 1 MG PO TABS: 0.5000 mg | ORAL_TABLET | Freq: Two times a day (BID) | ORAL | 0 refills | Status: DC

## 2016-08-06 MED ORDER — LORAZEPAM 1 MG PO TABS
0.5000 mg | ORAL_TABLET | Freq: Once | ORAL | Status: AC
  Administered 2016-08-06: 0.5 mg via ORAL
  Filled 2016-08-06: qty 1

## 2016-08-06 NOTE — ED Notes (Signed)
Taken to xray at this time. 

## 2016-08-06 NOTE — ED Notes (Signed)
Snack provided at Dr Joanie Coddington request.

## 2016-08-06 NOTE — ED Triage Notes (Addendum)
Patient complains of intermittent palpitations and shortness of breath since this past Thursday. On arrival appears anxious and states these symptoms are making her anxiety worse. No distress, alert and oriented, VSS. Hx of atrial fib. Patient reports that she had chest pain the past 2 days and none on arrival.

## 2016-08-06 NOTE — ED Provider Notes (Addendum)
[facility] DEPT Provider Note   CSN: [id number] Arrival date & time: 08/06/16  1845     History   Chief Complaint Chief Complaint  Patient presents with  . Palpitations  . Shortness of Breath    HPI Crystal Brennan is a 51 y.o. female.  HPI Patient complained of heart racing 2 episodes today each lasting approximately 30 minutes, accompanied by shortness of breath and anxiety. She also complained of a dull chest ache in her anterior chest 2 episodes whICH did not occur at the same time as her heart racing. She is presently asymptomatic except she feels nervous. She has been having intermittent palpitations for several days. She has called Dr. Jacalyn Lefevre office who advised her to come to the emergency department for evaluation and an appointment has been arranged for her in the office for 08/08/2016. Chest discomfort comes at rest. Not made worse by or better by anything. Nonexertional. Patient had negative Lexiscan November 2018 with ejection fraction 78%. Other associated symptoms include leg swelling for several years. She treated herself with a fluid pill yesterday and has no leg swelling today no other associated symptoms. Nothing makes symptoms better or worse.Marland Kitchen  Past Medical History:  Diagnosis Date  . Anemia   . Anxiety   . Arthritis    shoulders   . Diastolic dysfunction    a. 12/2013 Echo: EF 65-70%, no rwma, Gr1 DD, PASP 48mmHg.  . Diverticulitis 2015  . Family history of adverse reaction to anesthesia    mother wakes up during surgery   . H/O cardiovascular stress test    a. 12/2013 MV: no ischemia/infarct, prominent breast attenuation.  . Hypertension   . PAF (paroxysmal atrial fibrillation) ([place])    a. 12/2013 PAF->TSH nl->converted with Dilt;  b. CHA2DS2VASc = 2-->Eliquis.  . Sleep apnea    cpap - does not know settings     Patient Active Problem List   Diagnosis Date Noted  . Diverticulitis of colon 06/29/2015  . Preop cardiovascular exam 05/11/2015  .  Anemia of chronic disease 03/03/2015  . Thrombocytosis ([place]) 03/03/2015  . Benign essential HTN 03/03/2015  . Morbid obesity due to excess calories ([place]) 03/03/2015  . Pelvic abscess in female   . Hypokalemia 02/20/2015  . Chronic diastolic congestive heart failure, NYHA class 1 ([place]) 02/20/2015  . Diverticulitis of large intestine with perforation without bleeding   . OSA (obstructive sleep apnea) 04/08/2014  . PAF (paroxysmal atrial fibrillation) ([place]) 01/01/2014  . Anxiety     Past Surgical History:  Procedure Laterality Date  . [place]; 1986; 1988  . CHOLECYSTECTOMY  2009  . COLON RESECTION N/A 06/29/2015   Procedure:  LAPAROSCOPIC ASSISTED SIGMOID COLECTOMY WITH MOBILIZATION OF SPLENIC  FLEXURE;  Surgeon: Alphonsa Overall, MD;  Location: [facility];  Service: General;  Laterality: N/A;  . cyst removed from right ovary      age 59   . TUBAL LIGATION  198    OB History    No data available       Home Medications    Prior to Admission medications   Medication Sig Start Date End Date Taking? Authorizing Provider  apixaban (ELIQUIS) 5 MG TABS tablet Take 1 tablet (5 mg total) by mouth 2 (two) times daily. [id number] 04/2018 06/14/16   Lelon Perla, MD  diltiazem (CARDIZEM CD) 180 MG 24 hr capsule Take 1 capsule (180 mg total) by mouth daily. 12/30/15   Rogelia Mire, NP  EVENING PRIMROSE OIL PO  Take 1 capsule by mouth daily as needed (For anxiety.).    [provider]  loratadine (CLARITIN) 10 MG tablet Take 10 mg by mouth daily as needed for allergies.     [provider]  metoprolol (LOPRESSOR) 50 MG tablet Take 1 tablet (50 mg total) by mouth 2 (two) times daily. 12/30/15   Rogelia Mire, NP  zolpidem (AMBIEN) 5 MG tablet Take 1 tablet (5 mg total) by mouth at bedtime as needed for sleep. 03/06/15   Robbie Lis, MD    Family History Family History  Problem Relation Age of Onset  . Heart attack Mother        ? in her 11's - never  sought treatment  . Pulmonary embolism Mother   . Other Father        ? hx  . Lupus Sister   . Heart failure Sister     Social History Social History  Substance Use Topics  . Smoking status: Former Smoker    Packs/day: 0.33    Years: 30.00    Types: Cigarettes    Quit date: 01/01/2015  . Smokeless tobacco: Never Used  . Alcohol use No     Allergies   Benadryl [diphenhydramine hcl]; Codeine; Dilaudid [hydromorphone hcl]; Other; and Percocet [oxycodone-acetaminophen]   Review of Systems Review of Systems  Constitutional: Negative.   HENT: Negative.   Respiratory: Positive for shortness of breath.   Cardiovascular: Positive for chest pain, palpitations and leg swelling.       Leg swelling for several years, resolved yesterday after treatment with a fluid pill  Gastrointestinal: Negative.   Musculoskeletal: Negative.   Skin: Negative.   Neurological: Negative.   Psychiatric/Behavioral: Negative.        Anxiety  All other systems reviewed and are negative.    Physical Exam Updated Vital Signs BP 131/73   Pulse 86   Temp 98.1 F (36.7 C)   Resp 18   SpO2 100%   Physical Exam  Constitutional: She appears well-developed and well-nourished.  HENT:  Head: Normocephalic and atraumatic.  Eyes: Conjunctivae are normal. Pupils are equal, round, and reactive to light.  Neck: Neck supple. No tracheal deviation present. No thyromegaly present.  Cardiovascular: Normal rate and regular rhythm.   No murmur heard. Pulmonary/Chest: Effort normal and breath sounds normal.  Abdominal: Soft. Bowel sounds are normal. She exhibits no distension. There is no tenderness.  Obese  Musculoskeletal: Normal range of motion. She exhibits no edema or tenderness.  Neurological: She is alert. Coordination normal.  Skin: Skin is warm and dry. No rash noted.  Psychiatric: She has a normal mood and affect.  Nursing note and vitals reviewed.    ED Treatments / Results  Labs (all labs  ordered are listed, but only abnormal results are displayed) Labs Reviewed  BASIC METABOLIC PANEL - Abnormal; Notable for the following:       Result Value   CO2 20 (*)    Glucose, Bld 180 (*)    All other components within normal limits  CBC - Abnormal; Notable for the following:    Hemoglobin 10.0 (*)    HCT 32.1 (*)    MCV 69.6 (*)    MCH 21.7 (*)    RDW 18.3 (*)    All other components within normal limits  Randolm Idol, ED    EKG  EKG Interpretation  Date/Time:  Sunday August 06 2016 18:51:38 EDT Ventricular Rate:  85 PR Interval:  146 QRS Duration:  70 QT Interval:  386 QTC Calculation: 459 R Axis:   79 Text Interpretation:  Normal sinus rhythm Nonspecific T wave abnormality Abnormal ECG No significant change since last tracing Confirmed by Orlie Dakin [phone]) on 08/06/2016 7:51:28 PM     I-STAT Troponin I at 20 3:15 PM 0.01, normal Results for orders placed or performed during the hospital encounter of 60/45/40  Basic metabolic panel  Result Value Ref Range   Sodium 135 135 - 145 mmol/L   Potassium 3.7 3.5 - 5.1 mmol/L   Chloride 107 101 - 111 mmol/L   CO2 20 (L) 22 - 32 mmol/L   Glucose, Bld 180 (H) 65 - 99 mg/dL   BUN 10 6 - 20 mg/dL   Creatinine, Ser 0.87 0.44 - 1.00 mg/dL   Calcium 9.0 8.9 - 10.3 mg/dL   GFR calc non Af Amer >60 >60 mL/min   GFR calc Af Amer >60 >60 mL/min   Anion gap 8 5 - 15  CBC  Result Value Ref Range   WBC 9.4 4.0 - 10.5 K/uL   RBC 4.61 3.87 - 5.11 MIL/uL   Hemoglobin 10.0 (L) 12.0 - 15.0 g/dL   HCT 32.1 (L) 36.0 - 46.0 %   MCV 69.6 (L) 78.0 - 100.0 fL   MCH 21.7 (L) 26.0 - 34.0 pg   MCHC 31.2 30.0 - 36.0 g/dL   RDW 18.3 (H) 11.5 - 15.5 %   Platelets 371 150 - 400 K/uL  I-stat troponin, ED  Result Value Ref Range   Troponin i, poc 0.00 0.00 - 0.08 ng/mL   Comment 3          I-stat troponin, ED  Result Value Ref Range   Troponin i, poc 0.01 0.00 - 0.08 ng/mL   Comment 3           Dg Chest 2 View  Result Date:  08/06/2016 CLINICAL DATA:  Tachycardia for 2 weeks EXAM: CHEST  2 VIEW COMPARISON:  03/28/2016 FINDINGS: No acute pulmonary infiltrate or effusion. Borderline to mild cardiomegaly unchanged. No pneumothorax. Mild degenerative changes of the spine. Surgical clips in the right upper quadrant. IMPRESSION: Borderline to mild cardiomegaly.  No edema or infiltrate Electronically Signed   By: Donavan Foil M.D.   On: 08/06/2016 20:12   Radiology No results found.  Procedures Procedures (including critical care time)  Medications Ordered in ED Medications - No data to display Chest x-ray viewed by me  Initial Impression / Assessment and Plan / ED Course  I have reviewed the triage vital signs and the nursing notes.  Pertinent labs & imaging results that were available during my care of the patient were reviewed by me and considered in my medical decision making (see chart for details).     11:15 PM patient is asymptomatic other than complaining of mild headache. Tylenol ordered. She has remained in normal sinus rhythm throughout her entire emergency department stay Noted to be mildly anemic however hemoglobin improved over one year ago. Feel that patient is stable to keep her scheduled appointment with cardiology this week.  11:40 PM patient requesting medicine for anxiety. She's had Ativan in the past which has helped her. Ativan 0.5 mg by mouth ordered. Plan prescription Ativan. Patient's keep her scheduled appointment with her cardiologist scheduled for 08/08/2016. She is referred to primary care. Heart score equals 3.[place] Controlled Substance reporting System queried Final Clinical Impressions(s) / ED Diagnoses  Diagnosis #1 atypical chest pain #2 palpitations #3 anxiety #4  anemia #5hyperglycemia Final diagnoses:  None    New Prescriptions New Prescriptions   No medications on file     Orlie Dakin, MD 08/06/16 [id number]    Orlie Dakin, MD 08/06/16 [id number]

## 2016-08-06 NOTE — Discharge Instructions (Signed)
Keep your scheduled appointment with your cardiologist on 08/08/2016. Tell office staff that you were seen here. Don't drink alcohol or drive or operate heavy machinery while taking the medication prescribed as it can cause drowsiness. Follow up with the primary care physician of your choice or you can call any of the and numbers on these discharge instructions to get a primary care physician. Return if concern for any reason.

## 2016-08-08 ENCOUNTER — Ambulatory Visit (INDEPENDENT_AMBULATORY_CARE_PROVIDER_SITE_OTHER): Payer: Commercial Managed Care - PPO | Admitting: Physician Assistant

## 2016-08-08 ENCOUNTER — Encounter: Payer: Self-pay | Admitting: Physician Assistant

## 2016-08-08 VITALS — BP 130/76 | HR 65 | Ht 62.0 in | Wt 257.0 lb

## 2016-08-08 DIAGNOSIS — R002 Palpitations: Secondary | ICD-10-CM | POA: Diagnosis not present

## 2016-08-08 DIAGNOSIS — I480 Paroxysmal atrial fibrillation: Secondary | ICD-10-CM | POA: Diagnosis not present

## 2016-08-08 DIAGNOSIS — I10 Essential (primary) hypertension: Secondary | ICD-10-CM | POA: Diagnosis not present

## 2016-08-08 DIAGNOSIS — G4733 Obstructive sleep apnea (adult) (pediatric): Secondary | ICD-10-CM

## 2016-08-08 DIAGNOSIS — R079 Chest pain, unspecified: Secondary | ICD-10-CM | POA: Diagnosis not present

## 2016-08-08 NOTE — Progress Notes (Signed)
Cardiology Office Note    Date:  08/08/2016   ID:  Crystal Brennan, DOB 21-Oct-1965, MRN [id number]  PCP:  Patient, No Pcp Per  Cardiologist:  Dr. Stanford Breed  Chief Complaint  Patient presents with  . Follow-up    pt c/o palpitation for one month, post hospital     History of Present Illness:  Crystal Brennan is a 51 y.o. female with PMH of HTN, OSA and PAF. She was initially diagnosed with atrial fibrillation in November 2015. At that time, she underwent echocardiogram and a stress testing which were normal. She was last seen in the cardiology office on 05-Jan-2016, her mother just passed away at that time and she was under a lot of emotional stress. She was on diltiazem and eliquis without significant recurrent atrial fibrillation. She will rely heavily on samples of eliquis due to financial reasons. Based on the phone conversation, she felt she had recurrent palpitation after she missed a dose of diltiazem. She was subsequently evaluated in the emergency room on 08/06/2016. EKG in the emergency room showed normal sinus rhythm, heart rate in the 80s. She was given antianxiety medication.  Patient presents today for cardiology office visit. She says even in the emergency room, she was still having palpitation, given lack of any evidence of recurrent atrial fibrillation, I would not recommend any further workup for palpitation. Her palpitation is likely related to anxiety. She says after she took Ativan, her symptoms completely went away and has not come back yet. Interestingly enough, she says last week with the first time she had any chest pain. It occurred at rest along the same time as the palpitation, however when she had atrial fibrillation in 2015, she never had any chest pain. The chest pain went away at around the same time as the palpitation after a dose of Ativan. She says she has been walking on a daily basis in order to lose weight, she has never experienced any chest pain with exertion. I  think her recent palpitation and likely the chest pain as well are related to anxiety given the lack of EKG changes seen in the emergency room while she was symptomatic. At this time, I do not recommend any workup, I will bring the patient back in a month for reassessment. If she continued to have recurrent chest discomfort, then we can consider stress testing but overall I think her symptom is quite atypical. She had intermittent chest pain lasting 2 hours at a time for majority of the day for essentially 4 days before she went to ED and her serial enzyme in the emergency room was negative. Her symptom is not related to exertion make it even less likely to be related to coronary artery disease.   Past Medical History:  Diagnosis Date  . Anemia   . Anxiety   . Arthritis    shoulders   . Diastolic dysfunction    a. 12/2013 Echo: EF 65-70%, no rwma, Gr1 DD, PASP 44mmHg.  . Diverticulitis 2015  . Family history of adverse reaction to anesthesia    mother wakes up during surgery   . H/O cardiovascular stress test    a. 12/2013 MV: no ischemia/infarct, prominent breast attenuation.  . Hypertension   . PAF (paroxysmal atrial fibrillation) ([place])    a. 12/2013 PAF->TSH nl->converted with Dilt;  b. CHA2DS2VASc = 2-->Eliquis.  . Sleep apnea    cpap - does not know settings     Past Surgical History:  Procedure Laterality Date  .  [place]; 1986; 1988  . CHOLECYSTECTOMY  2009  . COLON RESECTION N/A 06/29/2015   Procedure:  LAPAROSCOPIC ASSISTED SIGMOID COLECTOMY WITH MOBILIZATION OF SPLENIC  FLEXURE;  Surgeon: Alphonsa Overall, MD;  Location: [facility];  Service: General;  Laterality: N/A;  . cyst removed from right ovary      age 70   . TUBAL LIGATION  198    Current Medications: Outpatient Medications Prior to Visit  Medication Sig Dispense Refill  . acetaminophen (TYLENOL) 500 MG tablet Take 500 mg by mouth every 6 (six) hours as needed for headache (pain).    Marland Kitchen apixaban (ELIQUIS)  5 MG TABS tablet Take 1 tablet (5 mg total) by mouth 2 (two) times daily. [id number] 04/2018 42 tablet 0  . diltiazem (CARDIZEM CD) 180 MG 24 hr capsule Take 1 capsule (180 mg total) by mouth daily. (Patient taking differently: Take 180 mg by mouth daily. Cartia XT) 30 capsule 11  . loratadine (CLARITIN) 10 MG tablet Take 10 mg by mouth daily as needed for allergies.     Marland Kitchen LORazepam (ATIVAN) 1 MG tablet Take 0.5 tablets (0.5 mg total) by mouth 2 (two) times daily. 5 tablet 0  . MELATONIN PO Take 1 tablet by mouth at bedtime as needed (sleep).    . metoprolol (LOPRESSOR) 50 MG tablet Take 1 tablet (50 mg total) by mouth 2 (two) times daily. 60 tablet 11  . naproxen sodium (ALEVE) 220 MG tablet Take 880 mg by mouth 2 (two) times daily as needed (menstrual cramps).    . zolpidem (AMBIEN) 5 MG tablet Take 1 tablet (5 mg total) by mouth at bedtime as needed for sleep. (Patient not taking: Reported on 08/06/2016) 30 tablet 0   No facility-administered medications prior to visit.      Allergies:   Dilaudid [hydromorphone hcl]; Benadryl [diphenhydramine hcl]; Other; Codeine; and Percocet [oxycodone-acetaminophen]   Social History   Social History  . Marital status: Married    Spouse name: N/A  . Number of children: N/A  . Years of education: N/A   Social History Main Topics  . Smoking status: Former Smoker    Packs/day: 0.33    Years: 30.00    Types: Cigarettes    Quit date: 01/01/2015  . Smokeless tobacco: Never Used  . Alcohol use No  . Drug use: No  . Sexual activity: Not Asked   Other Topics Concern  . None   Social History Narrative   Lives in [place] with fiance.  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  Walks regularly.  Trying to lose weight.     Family History:  The patient's family history includes Heart attack in her mother; Heart failure in her sister; Lupus in her sister; Other in her father; Pulmonary embolism in her mother.   ROS:   Please see the history of  present illness.    ROS All other systems reviewed and are negative.   PHYSICAL EXAM:   VS:  BP 130/76 (BP Location: Right Arm, Patient Position: Sitting, Cuff Size: Large)   Pulse 65   Ht 5\' 2"  (1.575 m)   Wt 257 lb (116.6 kg)   BMI 47.01 kg/m    GEN: Well nourished, well developed, in no acute distress  HEENT: normal  Neck: no JVD, carotid bruits, or masses Cardiac: RRR; no murmurs, rubs, or gallops,no edema  Respiratory:  clear to auscultation bilaterally, normal work of breathing GI: soft, nontender, nondistended, + BS MS: no  deformity or atrophy  Skin: warm and dry, no rash Neuro:  Alert and Oriented x 3, Strength and sensation are intact Psych: euthymic mood, full affect  Wt Readings from Last 3 Encounters:  08/08/16 257 lb (116.6 kg)  12/30/15 235 lb 3.2 oz (106.7 kg)  06/29/15 243 lb (110.2 kg)      Studies/Labs Reviewed:   EKG:  EKG is ordered today.  The ekg ordered today demonstrates Normal sinus rhythm, poor R wave progression anteriorly, otherwise no significant ST-T wave changes. No significant change compared to the previous EKG.  Recent Labs: 08/06/2016: BUN 10; Creatinine, Ser 0.87; Hemoglobin 10.0; Platelets 371; Potassium 3.7; Sodium 135   Lipid Panel    Component Value Date/Time   CHOL 168 01/02/2014 0159   TRIG 158 (H) 03/02/2015 0455   HDL 43 01/02/2014 0159   CHOLHDL 3.9 01/02/2014 0159   VLDL 32 01/02/2014 0159   LDLCALC 93 01/02/2014 0159    Additional studies/ records that were reviewed today include:   Echo 01/02/2014 LV EF: 65% -  70%  Study Conclusions  - Left ventricle: The cavity size was normal. There was mild concentric hypertrophy. Systolic function was vigorous. The estimated ejection fraction was in the range of 65% to 70%. Wall motion was normal; there were no regional wall motion abnormalities. Doppler parameters are consistent with abnormal left ventricular relaxation (grade 1 diastolic dysfunction). -  Pulmonary arteries: Systolic pressure was mildly increased. PA peak pressure: 40 mm Hg (S).    Myoview 01/02/2014 IMPRESSION: 1. No reversible ischemia or infarction.  2. Normal left ventricular wall motion.  3. Low-risk myocardial perfusion study with prominent breast attenuation artifact.   ASSESSMENT:    1. Chest pain, unspecified type   2. Palpitation   3. PAF (paroxysmal atrial fibrillation) ([place])   4. Essential hypertension   5. OSA (obstructive sleep apnea)      PLAN:  In order of problems listed above:  1. Chest pain: Very atypical. Lasting 2 hours at that time for majority of the day for 4 days, her troponin was negative. Does not occur with exertion, in fact she has been ambulating on daily basis for the past 2 weeks in order to lose weight and never had any symptoms during exertion.  2. Palpitation: She sought medical attention in the emergency room, despite the fact that she was symptomatic in the emergency room with active palpitation, EKG showed normal sinus rhythm. After a dose of Ativan, her symptom completely went away, it is likely her symptom was caused by anxiety instead of real underlying rhythm issue.  3. History of paroxysmal atrial fibrillation on eliquis: We'll continue on current dose of diltiazem given lack of any proof of recurrence of atrial fibrillation.  4. Hypertension: Blood pressure stable.    Medication Adjustments/Labs and Tests Ordered: Current medicines are reviewed at length with the patient today.  Concerns regarding medicines are outlined above.  Medication changes, Labs and Tests ordered today are listed in the Patient Instructions below. Patient Instructions  Medication Instructions:   No changes  Labwork:   none  Testing/Procedures:  none  Follow-Up:  With Almyra Deforest PA in 1 month  Please follow up with [place] at that time to discuss whether your medications are continuing to help with your symptoms. Call sooner if new  concerns.  If you need a refill on your cardiac medications before your next appointment, please call your pharmacy.      Hilbert Corrigan, [place]  08/08/2016 4:54  PM    [facility] [place], [place], [place]  [address] Phone: [phone]; Fax: [phone]

## 2016-08-08 NOTE — Patient Instructions (Addendum)
Medication Instructions:   No changes  Labwork:   none  Testing/Procedures:  none  Follow-Up:  With Almyra Deforest PA in 1 month  Please follow up with [place] at that time to discuss whether your medications are continuing to help with your symptoms. Call sooner if new concerns.  If you need a refill on your cardiac medications before your next appointment, please call your pharmacy.

## 2016-09-13 ENCOUNTER — Ambulatory Visit: Payer: Commercial Managed Care - PPO | Admitting: Physician Assistant

## 2016-09-21 DIAGNOSIS — I480 Paroxysmal atrial fibrillation: Secondary | ICD-10-CM | POA: Diagnosis not present

## 2016-09-21 DIAGNOSIS — G4733 Obstructive sleep apnea (adult) (pediatric): Secondary | ICD-10-CM | POA: Diagnosis not present

## 2016-09-26 DIAGNOSIS — K5732 Diverticulitis of large intestine without perforation or abscess without bleeding: Secondary | ICD-10-CM | POA: Diagnosis not present

## 2016-10-13 ENCOUNTER — Telehealth: Payer: Self-pay | Admitting: *Deleted

## 2016-10-13 MED ORDER — APIXABAN 5 MG PO TABS: 5.0000 mg | ORAL_TABLET | Freq: Two times a day (BID) | ORAL | 0 refills | Status: DC

## 2016-10-13 NOTE — Telephone Encounter (Signed)
NEEDING SAMPLE ,OF ELIQUIS 5 MG AT LEAST 2 WEEKS.   SAMPLES AVAILABLE

## 2016-10-27 DIAGNOSIS — Z124 Encounter for screening for malignant neoplasm of cervix: Secondary | ICD-10-CM | POA: Diagnosis not present

## 2016-10-27 DIAGNOSIS — R5383 Other fatigue: Secondary | ICD-10-CM | POA: Diagnosis not present

## 2016-10-27 DIAGNOSIS — N920 Excessive and frequent menstruation with regular cycle: Secondary | ICD-10-CM | POA: Diagnosis not present

## 2016-10-27 DIAGNOSIS — Z0000 Encounter for general adult medical examination without abnormal findings: Secondary | ICD-10-CM | POA: Diagnosis not present

## 2016-10-27 DIAGNOSIS — E559 Vitamin D deficiency, unspecified: Secondary | ICD-10-CM | POA: Diagnosis not present

## 2016-11-08 ENCOUNTER — Other Ambulatory Visit: Payer: Self-pay | Admitting: Obstetrics and Gynecology

## 2016-11-08 ENCOUNTER — Other Ambulatory Visit (HOSPITAL_COMMUNITY)
Admission: RE | Admit: 2016-11-08 | Discharge: 2016-11-08 | Disposition: A | Discharge status: Home / Self Care | Payer: Commercial Managed Care - PPO | Source: Ambulatory Visit | Attending: Obstetrics and Gynecology | Admitting: Obstetrics and Gynecology

## 2016-11-08 DIAGNOSIS — D509 Iron deficiency anemia, unspecified: Secondary | ICD-10-CM | POA: Diagnosis not present

## 2016-11-08 DIAGNOSIS — I480 Paroxysmal atrial fibrillation: Secondary | ICD-10-CM | POA: Diagnosis not present

## 2016-11-08 DIAGNOSIS — N920 Excessive and frequent menstruation with regular cycle: Secondary | ICD-10-CM | POA: Diagnosis not present

## 2016-11-08 DIAGNOSIS — Z124 Encounter for screening for malignant neoplasm of cervix: Secondary | ICD-10-CM | POA: Diagnosis not present

## 2016-11-10 LAB — CYTOLOGY - PAP
Adequacy: ABSENT
DIAGNOSIS: NEGATIVE
HPV: NOT DETECTED

## 2017-01-15 ENCOUNTER — Telehealth: Payer: Self-pay | Admitting: Cardiology

## 2017-01-15 NOTE — Telephone Encounter (Signed)
Left a message to call back.

## 2017-01-15 NOTE — Telephone Encounter (Signed)
Returned the call to the patient. She stated that she recently changed jobs and can no longer afford Eliquis. She would like to change to a different medication and stated that she does not want to take Warfarin either.   Samples have been provided for her at the front desk.  Drug name: Eliquis        Strength: 5 mg          Qty: 2 boxes   LOT: [id number]   Exp.Date: 12/20  Message routed to the provider and pharmd for further recommendation.

## 2017-01-15 NOTE — Telephone Encounter (Signed)
Patient calling states, that she cannot afford the Eliquis Co-Pay every month and would like an alternative. Patient states that she is "scared" because she missed one day and is now out of the medication.

## 2017-01-15 NOTE — Telephone Encounter (Signed)
There are no other less expensive alternative - warfarin is it.  If she wants to stay on Eliquis she can always go thru the manufacturer and see if they will give it to her free for 2019 if she is unemployed.

## 2017-01-16 MED ORDER — RIVAROXABAN 20 MG PO TABS: 20.0000 mg | ORAL_TABLET | Freq: Every day | ORAL | 6 refills | Status: DC

## 2017-01-16 NOTE — Telephone Encounter (Signed)
Returning [name]'s call from yesterday.

## 2017-01-16 NOTE — Telephone Encounter (Signed)
Called patient. She states she cannot afford eliquis @ $150/month. She reports it was $60/month previously with her old job/insurance. Explained that samples were left for her to pick up. Advised that she can contact her insurance to check on coverage of alternatives such as xarelto and pradaxa and call us back. Patient was tearful on the phone and is worried about having a stroke. She states she is unable to take warfarin as she could not make it to INR check appointments d/t her job.

## 2017-01-16 NOTE — Addendum Note (Signed)
Addended by: Cristopher Estimable on: 01/16/2017 12:49 PM   Modules accepted: Orders

## 2017-01-16 NOTE — Telephone Encounter (Signed)
DC apixaban; xarelto 20 mg daily Kirk Ruths

## 2017-01-16 NOTE — Telephone Encounter (Signed)
Left detailed message for the patient regarding the change. She is to call back with questions. New script sent to the pharmacy

## 2017-01-16 NOTE — Telephone Encounter (Signed)
Patient came to office to pick up Crystal Brennan samples.Stated she called her insurance and co pay for Xarelto and Pradaxa better price $35.Advised I will send message to [facility] for a order to change.

## 2017-01-24 ENCOUNTER — Other Ambulatory Visit: Payer: Self-pay | Admitting: Nurse Practitioner

## 2017-01-24 DIAGNOSIS — I10 Essential (primary) hypertension: Secondary | ICD-10-CM

## 2017-01-26 ENCOUNTER — Other Ambulatory Visit: Payer: Self-pay | Admitting: *Deleted

## 2017-01-26 DIAGNOSIS — I10 Essential (primary) hypertension: Secondary | ICD-10-CM

## 2017-01-26 MED ORDER — DILTIAZEM HCL ER COATED BEADS 180 MG PO CP24: 180.0000 mg | ORAL_CAPSULE | Freq: Every day | ORAL | 1 refills | Status: DC

## 2017-01-26 MED ORDER — METOPROLOL TARTRATE 50 MG PO TABS: 50.0000 mg | ORAL_TABLET | Freq: Two times a day (BID) | ORAL | 1 refills | Status: DC

## 2017-03-06 ENCOUNTER — Encounter (HOSPITAL_COMMUNITY): Payer: Self-pay | Admitting: Emergency Medicine

## 2017-03-06 ENCOUNTER — Other Ambulatory Visit: Payer: Self-pay

## 2017-03-06 ENCOUNTER — Emergency Department (HOSPITAL_COMMUNITY)
Admission: EM | Admit: 2017-03-06 | Discharge: 2017-03-06 | Disposition: A | Discharge status: Home / Self Care | Payer: 59 | Attending: Emergency Medicine | Admitting: Emergency Medicine

## 2017-03-06 ENCOUNTER — Emergency Department (HOSPITAL_COMMUNITY): Payer: 59

## 2017-03-06 DIAGNOSIS — I5032 Chronic diastolic (congestive) heart failure: Secondary | ICD-10-CM | POA: Insufficient documentation

## 2017-03-06 DIAGNOSIS — Z7901 Long term (current) use of anticoagulants: Secondary | ICD-10-CM | POA: Insufficient documentation

## 2017-03-06 DIAGNOSIS — R42 Dizziness and giddiness: Secondary | ICD-10-CM

## 2017-03-06 DIAGNOSIS — Z79899 Other long term (current) drug therapy: Secondary | ICD-10-CM | POA: Diagnosis not present

## 2017-03-06 DIAGNOSIS — Z87891 Personal history of nicotine dependence: Secondary | ICD-10-CM | POA: Insufficient documentation

## 2017-03-06 DIAGNOSIS — R61 Generalized hyperhidrosis: Secondary | ICD-10-CM | POA: Diagnosis not present

## 2017-03-06 DIAGNOSIS — I110 Hypertensive heart disease with heart failure: Secondary | ICD-10-CM | POA: Insufficient documentation

## 2017-03-06 DIAGNOSIS — R51 Headache: Secondary | ICD-10-CM | POA: Diagnosis not present

## 2017-03-06 LAB — CBC WITH DIFFERENTIAL/PLATELET
Basophils Absolute: 0 10*3/uL (ref 0.0–0.1)
Basophils Relative: 0 %
Eosinophils Absolute: 0.2 10*3/uL (ref 0.0–0.7)
Eosinophils Relative: 4 %
HEMATOCRIT: 30.1 % — AB (ref 36.0–46.0)
HEMOGLOBIN: 9.4 g/dL — AB (ref 12.0–15.0)
LYMPHS ABS: 2 10*3/uL (ref 0.7–4.0)
LYMPHS PCT: 33 %
MCH: 22 pg — AB (ref 26.0–34.0)
MCHC: 31.2 g/dL (ref 30.0–36.0)
MCV: 70.5 fL — AB (ref 78.0–100.0)
Monocytes Absolute: 0.5 10*3/uL (ref 0.1–1.0)
Monocytes Relative: 8 %
NEUTROS ABS: 3.4 10*3/uL (ref 1.7–7.7)
NEUTROS PCT: 55 %
Platelets: 399 10*3/uL (ref 150–400)
RBC: 4.27 MIL/uL (ref 3.87–5.11)
RDW: 18 % — ABNORMAL HIGH (ref 11.5–15.5)
WBC: 6.1 10*3/uL (ref 4.0–10.5)

## 2017-03-06 LAB — COMPREHENSIVE METABOLIC PANEL
ALK PHOS: 50 U/L (ref 38–126)
ALT: 11 U/L — ABNORMAL LOW (ref 14–54)
ANION GAP: 5 (ref 5–15)
AST: 15 U/L (ref 15–41)
Albumin: 3.4 g/dL — ABNORMAL LOW (ref 3.5–5.0)
BUN: 9 mg/dL (ref 6–20)
CALCIUM: 8.5 mg/dL — AB (ref 8.9–10.3)
CO2: 24 mmol/L (ref 22–32)
Chloride: 108 mmol/L (ref 101–111)
Creatinine, Ser: 0.65 mg/dL (ref 0.44–1.00)
GFR calc non Af Amer: >60 mL/min (ref >60)
Glucose, Bld: 75 mg/dL (ref 65–99)
Potassium: 3.7 mmol/L (ref 3.5–5.1)
SODIUM: 137 mmol/L (ref 135–145)
TOTAL PROTEIN: 6.5 g/dL (ref 6.5–8.1)
Total Bilirubin: 0.4 mg/dL (ref 0.3–1.2)

## 2017-03-06 LAB — URINALYSIS, ROUTINE W REFLEX MICROSCOPIC
BACTERIA UA: NONE SEEN
BILIRUBIN URINE: NEGATIVE
Glucose, UA: NEGATIVE mg/dL
KETONES UR: NEGATIVE mg/dL
LEUKOCYTES UA: NEGATIVE
Nitrite: NEGATIVE
PH: 5 (ref 5.0–8.0)
Protein, ur: NEGATIVE mg/dL
Specific Gravity, Urine: 1.013 (ref 1.005–1.030)

## 2017-03-06 LAB — CBG MONITORING, ED: Glucose-Capillary: 85 mg/dL (ref 65–99)

## 2017-03-06 LAB — CK: Total CK: 85 U/L (ref 38–234)

## 2017-03-06 LAB — POC URINE PREG, ED: PREG TEST UR: NEGATIVE

## 2017-03-06 MED ORDER — SODIUM CHLORIDE 0.9 % IV BOLUS (SEPSIS)
1000.0000 mL | Freq: Once | INTRAVENOUS | Status: AC
  Administered 2017-03-06: 1000 mL via INTRAVENOUS

## 2017-03-06 MED ORDER — MECLIZINE HCL 25 MG PO TABS
50.0000 mg | ORAL_TABLET | Freq: Once | ORAL | Status: AC
  Administered 2017-03-06: 50 mg via ORAL
  Filled 2017-03-06: qty 2

## 2017-03-06 MED ORDER — MECLIZINE HCL 25 MG PO TABS: 25.0000 mg | ORAL_TABLET | Freq: Three times a day (TID) | ORAL | 0 refills | Status: DC | PRN

## 2017-03-06 NOTE — ED Notes (Signed)
Pt's CBG=85

## 2017-03-06 NOTE — ED Triage Notes (Addendum)
Pt c/o vertigo, nausea and night sweats for over one week, pt states nausea x 2 weeks, pt states she has attempted Zofran po with temporary relief. Pt states she is still getting periods and having cramping and continues to have irregular bleeding and passing clots at times.

## 2017-03-06 NOTE — ED Notes (Signed)
Pt has stated she is a "hard stick" for labs, "they roll and blow".

## 2017-03-06 NOTE — ED Provider Notes (Addendum)
[facility] DEPT Provider Note   CSN: [id number] Arrival date & time: 03/06/17  [id number]     History   Chief Complaint Chief Complaint  Patient presents with  . Dizziness  . Night Sweats  . Nausea    HPI Crystal Brennan is a 52 y.o. female.  HPI 52 year old female presents with a chief complaint of dizziness.  For about 1 week she has been having dizziness and headache.  The headache is all over but may be a little worse in the occiput.  It feels like a dull pain that is constant and moderate in intensity.  Dizziness comes and goes but is frequent.  Any type of movement, including head turning, sitting up, lying down, will cause dizziness.  Feels like she is off balance like she is drunk.  It is brief but comes on and off quickly.  She has chronic tinnitus in both ears that has been over one year.  No new ear pain.  She has been having some on and off blurry vision when she watches TV for over 1 month.  For the last 1 week or so she is also been having night sweats and waking up in the middle the night.  She has been having on and off lower abdominal cramping as well as bilateral thigh cramping for about 2 weeks.  She feels thirsty like her mouth is dry.  She states she always urinates a lot but thinks she might be urinating more recently.  She has a history of atrial fibrillation and has been compliant with her rate control meds and Xarelto.  She denies neck pain, chest pain, shortness of breath, vomiting.  The abdominal pain feels like a cramping sensation like she might need to go the bathroom.  She was constipated earlier in the week and now states she is having some diarrhea.  Past Medical History:  Diagnosis Date  . Anemia   . Anxiety   . Arthritis    shoulders   . Diastolic dysfunction    a. 12/2013 Echo: EF 65-70%, no rwma, Gr1 DD, PASP 1mmHg.  . Diverticulitis 2015  . Family history of adverse reaction to anesthesia    mother wakes up during surgery    . H/O cardiovascular stress test    a. 12/2013 MV: no ischemia/infarct, prominent breast attenuation.  . Hypertension   . PAF (paroxysmal atrial fibrillation) ([place])    a. 12/2013 PAF->TSH nl->converted with Dilt;  b. CHA2DS2VASc = 2-->Eliquis.  . Sleep apnea    cpap - does not know settings     Patient Active Problem List   Diagnosis Date Noted  . Diverticulitis of colon 06/29/2015  . Preop cardiovascular exam 05/11/2015  . Anemia of chronic disease 03/03/2015  . Thrombocytosis ([place]) 03/03/2015  . Benign essential HTN 03/03/2015  . Morbid obesity due to excess calories ([place]) 03/03/2015  . Pelvic abscess in female   . Hypokalemia 02/20/2015  . Chronic diastolic congestive heart failure, NYHA class 1 ([place]) 02/20/2015  . Diverticulitis of large intestine with perforation without bleeding   . OSA (obstructive sleep apnea) 04/08/2014  . PAF (paroxysmal atrial fibrillation) ([place]) 01/01/2014  . Anxiety     Past Surgical History:  Procedure Laterality Date  . [place]; 1986; 1988  . CHOLECYSTECTOMY  2009  . COLON RESECTION N/A 06/29/2015   Procedure:  LAPAROSCOPIC ASSISTED SIGMOID COLECTOMY WITH MOBILIZATION OF SPLENIC  FLEXURE;  Surgeon: Alphonsa Overall, MD;  Location: [facility];  Service: General;  Laterality: N/A;  . cyst removed from right ovary      age 35   . TUBAL LIGATION  198    OB History    No data available       Home Medications    Prior to Admission medications   Medication Sig Start Date End Date Taking? Authorizing Provider  diltiazem (CARDIZEM CD) 180 MG 24 hr capsule Take 1 capsule (180 mg total) by mouth daily. 01/26/17  Yes Lelon Perla, MD  LORazepam (ATIVAN) 1 MG tablet Take 0.5 tablets (0.5 mg total) by mouth 2 (two) times daily. 08/06/16  Yes Orlie Dakin, MD  metoprolol tartrate (LOPRESSOR) 50 MG tablet Take 1 tablet (50 mg total) by mouth 2 (two) times daily. Contact office for an appointment 01/26/17  Yes Lelon Perla, MD    naproxen sodium (ALEVE) 220 MG tablet Take 880 mg by mouth daily as needed (PAIN).   Yes [provider]  ondansetron (ZOFRAN) 4 MG tablet Take 4 mg by mouth every 6 (six) hours as needed for nausea or vomiting.   Yes [provider]  rivaroxaban (XARELTO) 20 MG TABS tablet Take 1 tablet (20 mg total) by mouth daily with supper. 01/16/17  Yes Lelon Perla, MD  meclizine (ANTIVERT) 25 MG tablet Take 1 tablet (25 mg total) by mouth 3 (three) times daily as needed for dizziness. 03/06/17   Sherwood Gambler, MD    Family History Family History  Problem Relation Age of Onset  . Heart attack Mother        ? in her 33's - never sought treatment  . Pulmonary embolism Mother   . Other Father        ? hx  . Lupus Sister   . Heart failure Sister     Social History Social History   Tobacco Use  . Smoking status: Former Smoker    Packs/day: 0.33    Years: 30.00    Pack years: 9.90    Types: Cigarettes    Last attempt to quit: 01/01/2015    Years since quitting: 2.1  . Smokeless tobacco: Never Used  Substance Use Topics  . Alcohol use: No    Alcohol/week: 0.0 oz  . Drug use: No     Allergies   Dilaudid [hydromorphone hcl]; Benadryl [diphenhydramine hcl]; Other; Codeine; and Percocet [oxycodone-acetaminophen]   Review of Systems Review of Systems  Constitutional: Positive for diaphoresis. Negative for fever.  Cardiovascular: Negative for chest pain.  Gastrointestinal: Negative for vomiting.  Neurological: Positive for dizziness and headaches.  All other systems reviewed and are negative.    Physical Exam Updated Vital Signs BP (!) 102/54   Pulse 63   Temp (!) 97.3 F (36.3 C) (Oral)   Resp 16   Ht 5\' 2"  (1.575 m)   Wt 117.9 kg (260 lb)   LMP 02/25/2017 (Within Days)   SpO2 100%   BMI 47.55 kg/m   Physical Exam  Constitutional: She is oriented to person, place, and time. She appears well-developed and well-nourished. No distress.  obese  HENT:   Head: Normocephalic and atraumatic.  Right Ear: External ear normal.  Left Ear: External ear normal.  Nose: Nose normal.  Eyes: EOM are normal. Pupils are equal, round, and reactive to light. Right eye exhibits no discharge. Left eye exhibits no discharge.  No nystagmus  Neck: Neck supple.  Cardiovascular: Normal rate, regular rhythm and normal heart sounds.  Pulmonary/Chest: Effort normal and breath sounds normal.  Abdominal:  Soft. There is no tenderness.  Neurological: She is alert and oriented to person, place, and time.  CN 3-12 grossly intact. 5/5 strength in all 4 extremities. Grossly normal sensation. Normal finger to nose. Normal gait  Skin: Skin is warm and dry. She is not diaphoretic.  Nursing note and vitals reviewed.    ED Treatments / Results  Labs (all labs ordered are listed, but only abnormal results are displayed) Labs Reviewed  CBC WITH DIFFERENTIAL/PLATELET - Abnormal; Notable for the following components:      Result Value   Hemoglobin 9.4 (*)    HCT 30.1 (*)    MCV 70.5 (*)    MCH 22.0 (*)    RDW 18.0 (*)    All other components within normal limits  URINALYSIS, ROUTINE W REFLEX MICROSCOPIC - Abnormal; Notable for the following components:   Hgb urine dipstick SMALL (*)    Squamous Epithelial / LPF 0-5 (*)    All other components within normal limits  COMPREHENSIVE METABOLIC PANEL - Abnormal; Notable for the following components:   Calcium 8.5 (*)    Albumin 3.4 (*)    ALT 11 (*)    All other components within normal limits  CK  CBG MONITORING, ED  POC URINE PREG, ED    EKG  EKG Interpretation  Date/Time:  Tuesday March 06 2017 12:41:08 EST Ventricular Rate:  61 PR Interval:    QRS Duration: 91 QT Interval:  431 QTC Calculation: 435 R Axis:   56 Text Interpretation:  Sinus rhythm Low voltage, precordial leads Borderline T wave abnormalities Baseline wander in lead(s) V5 nonspecific T waves similar to June 2018 Confirmed by Sherwood Gambler  [phone]) on 03/06/2017 12:58:17 PM       Radiology Ct Head Wo Contrast  Result Date: 03/06/2017 CLINICAL DATA:  Vertigo with nausea and night sweats 1 week. EXAM: CT HEAD WITHOUT CONTRAST TECHNIQUE: Contiguous axial images were obtained from the base of the skull through the vertex without intravenous contrast. COMPARISON:  07/22/2005 FINDINGS: Brain: No evidence of acute infarction, hemorrhage, hydrocephalus, extra-axial collection or mass lesion/mass effect. Vascular: No hyperdense vessel or unexpected calcification. Skull: Normal. Negative for fracture or focal lesion. Sinuses/Orbits: No acute finding. Other: None. IMPRESSION: Normal head CT. Electronically Signed   By: Marin Olp M.D.   On: 03/06/2017 12:19    Procedures Procedures (including critical care time)  Medications Ordered in ED Medications  sodium chloride 0.9 % bolus 1,000 mL (1,000 mLs Intravenous New Bag/Given 03/06/17 1300)  meclizine (ANTIVERT) tablet 50 mg (50 mg Oral Given 03/06/17 1327)     Initial Impression / Assessment and Plan / ED Course  I have reviewed the triage vital signs and the nursing notes.  Pertinent labs & imaging results that were available during my care of the patient were reviewed by me and considered in my medical decision making (see chart for details).     Patient presents with intermittent dizziness for over 1 week.  Overall, her neuro exam is unremarkable including normal gait to the bathroom.  She has no focal neuro deficits.  She is not complaining of chest pain or shortness of breath.  Her abdominal exam is benign.  Workup is unremarkable besides stable anemia.  Given her headache with dizziness for 1 week a CT was obtained but is unremarkable.  My suspicion for posterior circulation stroke is low.  This is likely vertigo given the brief intermittent nature with room-spinning sensation. She is feeling significant better after Antivert and  fluids, continue Antivert as needed at home as well  as increase fluids at home.  Follow-up with PCP.  She states she has a new PCP and should be able to follow-up closely.  Discussed return precautions.  CHA2DS2/VAS Stroke Risk Points      3 >= 2 Points: High Risk  1 - 1.99 Points: Medium Risk  0 Points: Low Risk    This is the only CHA2DS2/VAS Stroke Risk Points available for the past  year.:  Change: N/A     Details    This score determines the patient's risk of having a stroke if the  patient has atrial fibrillation.       Points Metrics  1 Has Congestive Heart Failure:  Yes   0 Has Vascular Disease:  No   1 Has Hypertension:  Yes   0 Age:  73   0 Has Diabetes:  No   0 Had Stroke:  No  Had TIA:  No  Had thromboembolism:  No   1 Female:  Yes             Final Clinical Impressions(s) / ED Diagnoses   Final diagnoses:  Vertigo    ED Discharge Orders        Ordered    meclizine (ANTIVERT) 25 MG tablet  3 times daily PRN     03/06/17 1530       Sherwood Gambler, MD 03/06/17 1537    Sherwood Gambler, MD 03/20/17 [phone]

## 2017-04-13 NOTE — Telephone Encounter (Signed)
Erroneous visit

## 2017-06-03 ENCOUNTER — Encounter (HOSPITAL_COMMUNITY): Payer: Self-pay | Admitting: Emergency Medicine

## 2017-06-03 ENCOUNTER — Ambulatory Visit (HOSPITAL_COMMUNITY)
Admission: EM | Admit: 2017-06-03 | Discharge: 2017-06-03 | Disposition: A | Discharge status: Home / Self Care | Payer: 59 | Attending: Internal Medicine | Admitting: Internal Medicine

## 2017-06-03 ENCOUNTER — Other Ambulatory Visit: Payer: Self-pay

## 2017-06-03 DIAGNOSIS — L03011 Cellulitis of right finger: Secondary | ICD-10-CM | POA: Diagnosis not present

## 2017-06-03 DIAGNOSIS — M79644 Pain in right finger(s): Secondary | ICD-10-CM

## 2017-06-03 MED ORDER — DOXYCYCLINE HYCLATE 100 MG PO CAPS: 100.0000 mg | ORAL_CAPSULE | Freq: Two times a day (BID) | ORAL | 0 refills | Status: AC

## 2017-06-03 NOTE — ED Provider Notes (Signed)
[place]    CSN: [id number] Arrival date & time: 06/03/17  1206     History   Chief Complaint Chief Complaint  Patient presents with  . Finger Injury    HPI Syleena Mchan is a 52 y.o. female.   Special presents with complaints of pain and swelling to her right hand middle finger cuticle which has been worsening over the past two weeks, especially over the past three days. No fevers. Mild pain proximal to the area of swelling. No drainage. Does not go to nail salon, denies known injury to nail, cuticle or biting of nails. Denies any previous similar. Has no tried any treatments for her symptoms. Pain 8/10. Hx anxiety, arthritis, chg, htn, afib.   ROS per HPI.      Past Medical History:  Diagnosis Date  . Anemia   . Anxiety   . Arthritis    shoulders   . Diastolic dysfunction    a. 12/2013 Echo: EF 65-70%, no rwma, Gr1 DD, PASP 81mmHg.  . Diverticulitis 2015  . Family history of adverse reaction to anesthesia    mother wakes up during surgery   . H/O cardiovascular stress test    a. 12/2013 MV: no ischemia/infarct, prominent breast attenuation.  . Hypertension   . PAF (paroxysmal atrial fibrillation) ([place])    a. 12/2013 PAF->TSH nl->converted with Dilt;  b. CHA2DS2VASc = 2-->Eliquis.  . Sleep apnea    cpap - does not know settings     Patient Active Problem List   Diagnosis Date Noted  . Diverticulitis of colon 06/29/2015  . Preop cardiovascular exam 05/11/2015  . Anemia of chronic disease 03/03/2015  . Thrombocytosis ([place]) 03/03/2015  . Benign essential HTN 03/03/2015  . Morbid obesity due to excess calories ([place]) 03/03/2015  . Pelvic abscess in female   . Hypokalemia 02/20/2015  . Chronic diastolic congestive heart failure, NYHA class 1 ([place]) 02/20/2015  . Diverticulitis of large intestine with perforation without bleeding   . OSA (obstructive sleep apnea) 04/08/2014  . PAF (paroxysmal atrial fibrillation) ([place]) 01/01/2014  . Anxiety     Past  Surgical History:  Procedure Laterality Date  . [place]; 1986; 1988  . CHOLECYSTECTOMY  2009  . COLON RESECTION N/A 06/29/2015   Procedure:  LAPAROSCOPIC ASSISTED SIGMOID COLECTOMY WITH MOBILIZATION OF SPLENIC  FLEXURE;  Surgeon: Alphonsa Overall, MD;  Location: [facility];  Service: General;  Laterality: N/A;  . cyst removed from right ovary      age 55   . TUBAL LIGATION  198    OB History   None      Home Medications    Prior to Admission medications   Medication Sig Start Date End Date Taking? Authorizing Provider  diltiazem (CARDIZEM CD) 180 MG 24 hr capsule Take 1 capsule (180 mg total) by mouth daily. 01/26/17   Lelon Perla, MD  doxycycline (VIBRAMYCIN) 100 MG capsule Take 1 capsule (100 mg total) by mouth 2 (two) times daily for 10 days. 06/03/17 06/13/17  Zigmund Gottron, NP  LORazepam (ATIVAN) 1 MG tablet Take 0.5 tablets (0.5 mg total) by mouth 2 (two) times daily. 08/06/16   Orlie Dakin, MD  meclizine (ANTIVERT) 25 MG tablet Take 1 tablet (25 mg total) by mouth 3 (three) times daily as needed for dizziness. 03/06/17   Sherwood Gambler, MD  metoprolol tartrate (LOPRESSOR) 50 MG tablet Take 1 tablet (50 mg total) by mouth 2 (two) times daily. Contact office for an appointment  01/26/17   Lelon Perla, MD  naproxen sodium (ALEVE) 220 MG tablet Take 880 mg by mouth daily as needed (PAIN).    [provider]  ondansetron (ZOFRAN) 4 MG tablet Take 4 mg by mouth every 6 (six) hours as needed for nausea or vomiting.    [provider]  rivaroxaban (XARELTO) 20 MG TABS tablet Take 1 tablet (20 mg total) by mouth daily with supper. 01/16/17   Lelon Perla, MD    Family History Family History  Problem Relation Age of Onset  . Heart attack Mother        ? in her 50's - never sought treatment  . Pulmonary embolism Mother   . Other Father        ? hx  . Lupus Sister   . Heart failure Sister     Social History Social History   Tobacco  Use  . Smoking status: Former Smoker    Packs/day: 0.33    Years: 30.00    Pack years: 9.90    Types: Cigarettes    Last attempt to quit: 01/01/2015    Years since quitting: 2.4  . Smokeless tobacco: Never Used  Substance Use Topics  . Alcohol use: No    Alcohol/week: 0.0 oz  . Drug use: No     Allergies   Dilaudid [hydromorphone hcl]; Benadryl [diphenhydramine hcl]; Other; Codeine; and Percocet [oxycodone-acetaminophen]   Review of Systems Review of Systems   Physical Exam Triage Vital Signs ED Triage Vitals  Enc Vitals Group     BP 06/03/17 1239 139/75     Pulse Rate 06/03/17 1239 75     Resp --      Temp 06/03/17 1239 98.1 F (36.7 C)     Temp Source 06/03/17 1239 Oral     SpO2 06/03/17 1239 100 %     Weight --      Height --      Head Circumference --      Peak Flow --      Pain Score 06/03/17 1235 8     Pain Loc --      Pain Edu? --      Excl. in [place]? --    No data found.  Updated Vital Signs BP 139/75 (BP Location: Left Arm)   Pulse 75   Temp 98.1 F (36.7 C) (Oral)   LMP 04/30/2017   SpO2 100%   Visual Acuity Right Eye Distance:   Left Eye Distance:   Bilateral Distance:    Right Eye Near:   Left Eye Near:    Bilateral Near:     Physical Exam  Constitutional: She is oriented to person, place, and time. She appears well-developed and well-nourished. No distress.  Cardiovascular: Normal rate and normal heart sounds.  Pulmonary/Chest: Effort normal and breath sounds normal.  Musculoskeletal:       Right hand: She exhibits tenderness.       Hands: Right middle finger with swelling to proximal cuticle with abscess presence, no active drainage. Without swelling or tenderness to pad of finger; without redness extending up finger and with full ROm  Neurological: She is alert and oriented to person, place, and time.  Skin: Skin is warm and dry.     UC Treatments / Results  Labs (all labs ordered are listed, but only abnormal results are  displayed) Labs Reviewed - No data to display  EKG None Radiology No results found.  Procedures Incision and Drainage Date/Time: 06/03/2017  1:19 PM Performed by: Zigmund Gottron, NP Authorized by: Wynona Luna, MD   Consent:    Consent obtained:  Verbal   Consent given by:  Patient   Risks discussed:  Incomplete drainage, pain and infection   Alternatives discussed:  Alternative treatment, observation and no treatment Location:    Type:  Abscess (paronychia )   Size:  74mm  Pre-procedure details:    Skin preparation:  Betadine Anesthesia (see MAR for exact dosages):    Anesthesia method:  Topical application   Topical anesthesia: freeze spray  Procedure type:    Complexity:  Simple Procedure details:    Needle aspiration: no     Incision types:  Stab incision   Scalpel blade:  11   Drainage:  Purulent   Drainage amount:  Moderate   Wound treatment:  Wound left open Post-procedure details:    Patient tolerance of procedure:  Tolerated well, no immediate complications   (including critical care time)  Medications Ordered in UC Medications - No data to display   Initial Impression / Assessment and Plan / UC Course  I have reviewed the triage vital signs and the nursing notes.  Pertinent labs & imaging results that were available during my care of the patient were reviewed by me and considered in my medical decision making (see chart for details).     Small stab incision completed with moderate drainage. Warm soaks and antibiotics recommended. Return precautions provided. Patient verbalized understanding and agreeable to plan.    Final Clinical Impressions(s) / UC Diagnoses   Final diagnoses:  Paronychia of right middle finger    ED Discharge Orders        Ordered    doxycycline (VIBRAMYCIN) 100 MG capsule  2 times daily     06/03/17 1314       Controlled Substance Prescriptions [place] Controlled Substance Registry consulted? Not Applicable     Zigmund Gottron, NP 06/03/17 1320

## 2017-06-03 NOTE — Discharge Instructions (Signed)
Warm soak 2-3 times a day to promote additional drainage. Complete course of antibiotics.  Tylenol and/or ibuprofen as needed for pain or fevers.  If symptoms worsen or do not improve in the next week to return to be seen or to follow up with your PCP.

## 2017-06-03 NOTE — ED Triage Notes (Signed)
C/o right hand middle finger nail bed infection

## 2017-09-08 ENCOUNTER — Other Ambulatory Visit: Payer: Self-pay | Admitting: Cardiology

## 2017-10-08 ENCOUNTER — Other Ambulatory Visit: Payer: Self-pay | Admitting: Cardiology

## 2017-10-09 ENCOUNTER — Other Ambulatory Visit: Payer: Self-pay | Admitting: Cardiology

## 2017-10-09 DIAGNOSIS — I10 Essential (primary) hypertension: Secondary | ICD-10-CM

## 2017-10-09 MED ORDER — METOPROLOL TARTRATE 50 MG PO TABS: 50.0000 mg | ORAL_TABLET | Freq: Two times a day (BID) | ORAL | 1 refills | Status: DC

## 2017-10-09 MED ORDER — RIVAROXABAN 20 MG PO TABS: ORAL_TABLET | ORAL | 1 refills | Status: DC

## 2017-10-09 NOTE — Addendum Note (Signed)
Addended by: Harrington Challenger on: 10/09/2017 02:55 PM   Modules accepted: Orders

## 2017-10-09 NOTE — Telephone Encounter (Signed)
New Message:       *STAT* If patient is at the pharmacy, call can be transferred to refill team.   1. Which medications need to be refilled? (please list name of each medication and dose if known) metoprolol tartrate (LOPRESSOR) 50 MG tablet  XARELTO 20 MG TABS tablet  2. Which pharmacy/location (including street and city if local pharmacy) is medication to be sent to?[facility], [address]  3. Do they need a 30 day or 90 day supply? 30    Pt states she is completely out of this medication and that the pharmacy sent a refill req two weeks ago

## 2017-10-22 IMAGING — CT CT ABD-PELV W/ CM
2 of 5 series · 15 of 46 positions shown, 17 images · IV contrast (omnipaque)
Comparison: March 01, 2015

CLINICAL DATA: History of diverticulitis. Generalized abdominal
pain for 3 days. Nausea and diarrhea

EXAM:
CT ABDOMEN AND PELVIS WITH CONTRAST
TECHNIQUE: Multidetector CT imaging of the abdomen and pelvis was performed
using the standard protocol following bolus administration of
intravenous contrast. Oral contrast was also administered.
CONTRAST:  100mL OMNIPAQUE IOHEXOL 300 MG/ML  SOLN

[Series 2: abd/pel with · axial · 0.77mm/px · z∈[-257,+138]mm · 12 of 91 slices shown, 14 images]
[im 6/91  soft-tissue]
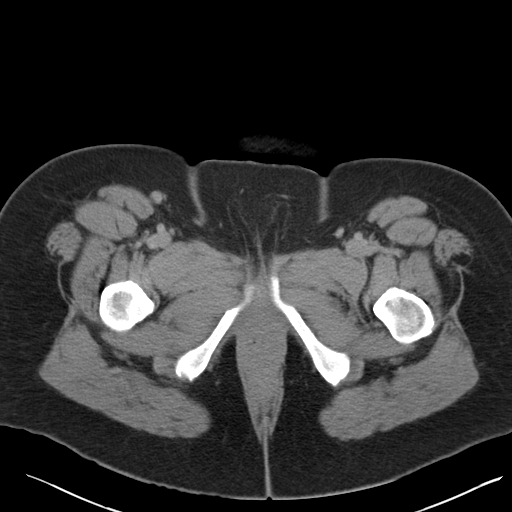
[im 6/91  bone]
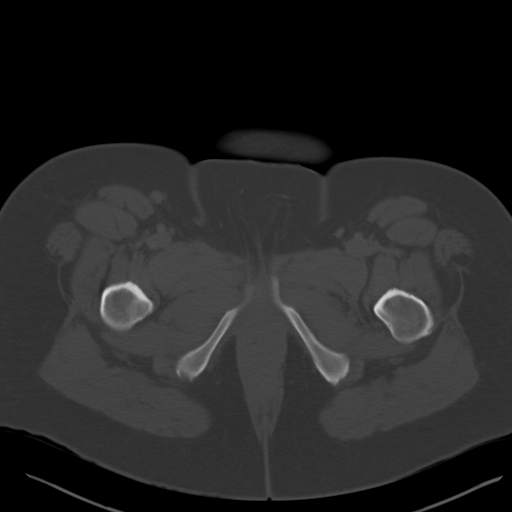
[im 12/91  soft-tissue]
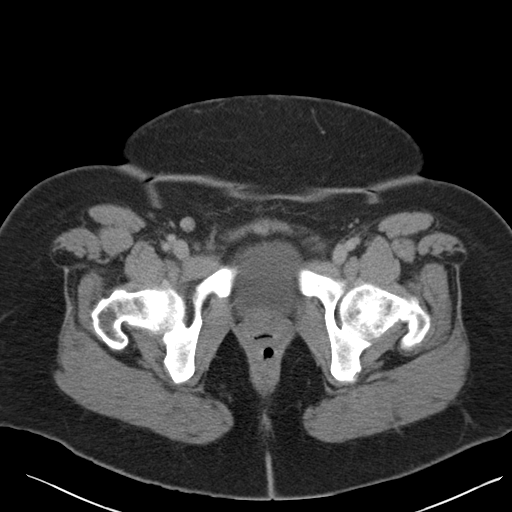
[im 23/91  soft-tissue]
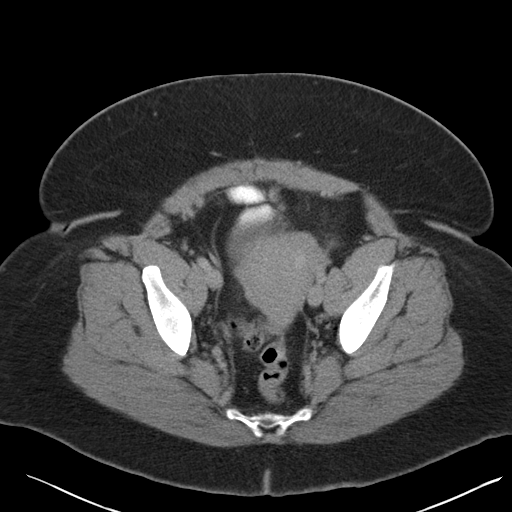
[im 29/91  soft-tissue]
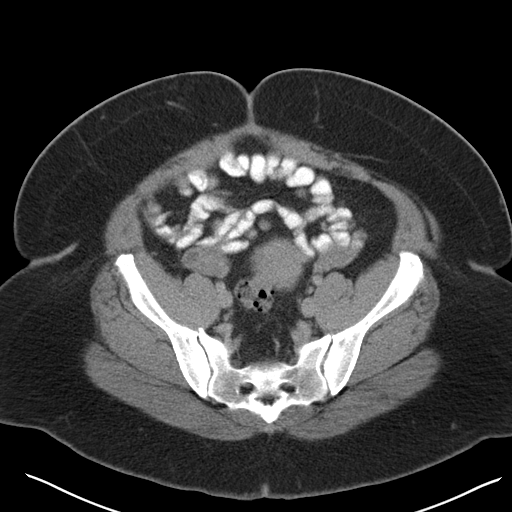
[im 34/91  soft-tissue]
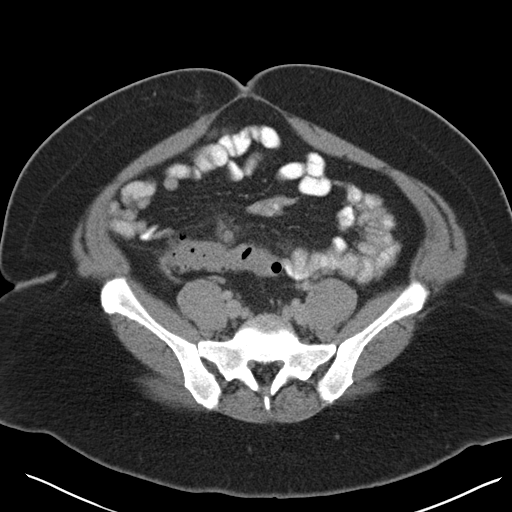
[im 40/91  soft-tissue]
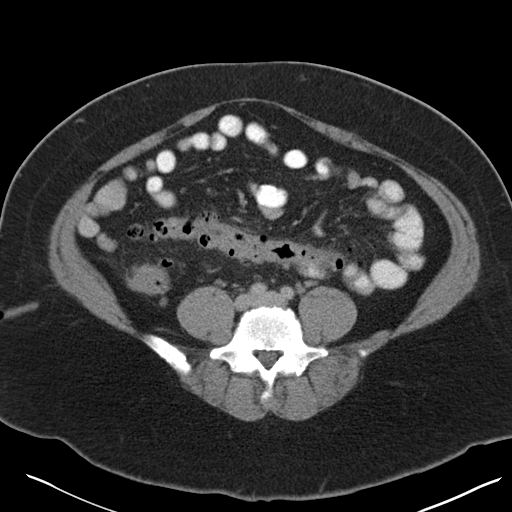
[im 51/91  soft-tissue]
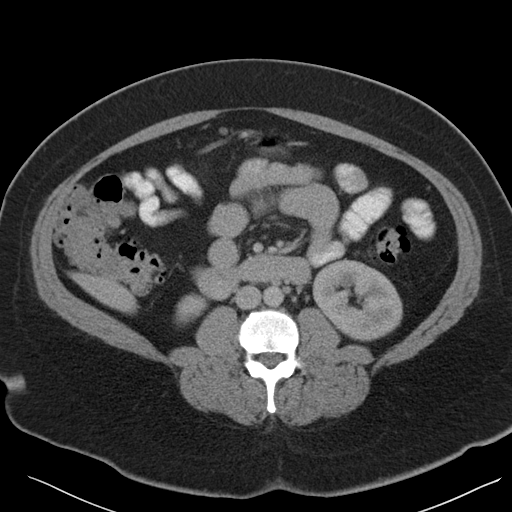
[im 57/91  soft-tissue]
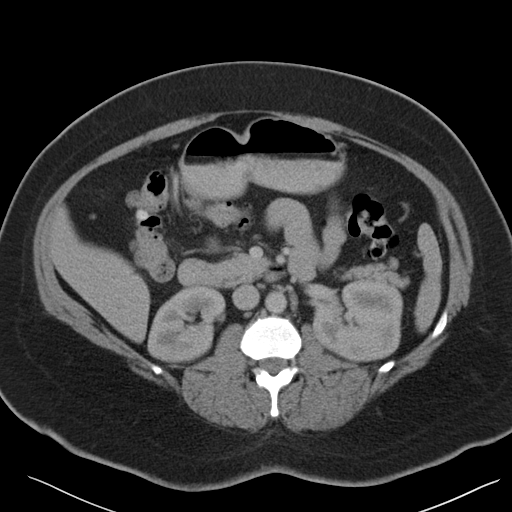
[im 62/91  soft-tissue]
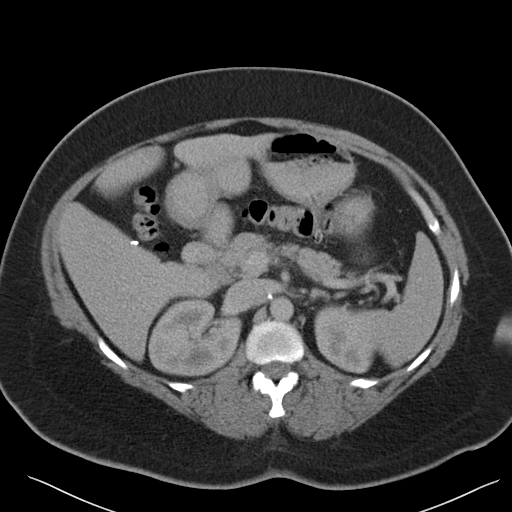
[im 62/91  bone]
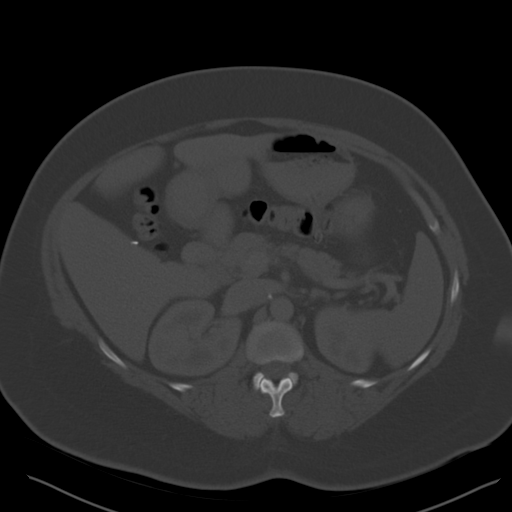
[im 68/91  soft-tissue]
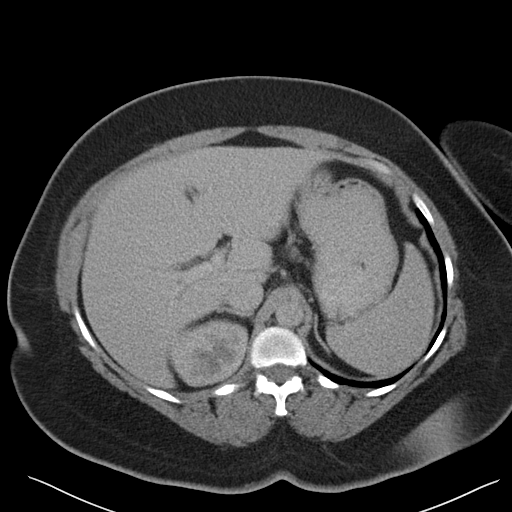
[im 79/91  soft-tissue]
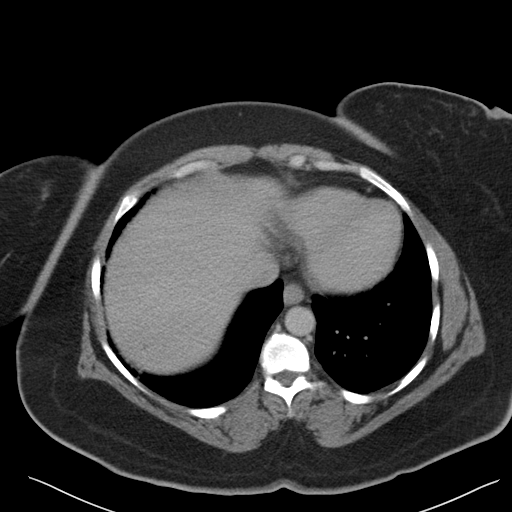
[im 85/91  soft-tissue]
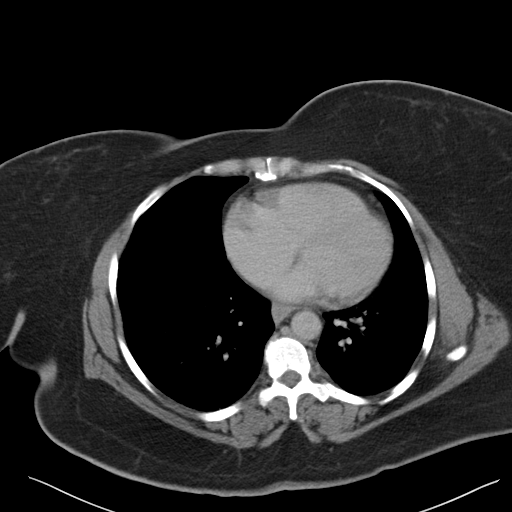

[Series 3: coronal a/|p · coronal · 0.74mm/px · 3 of 171 slices shown]
[im 57/171  soft-tissue]
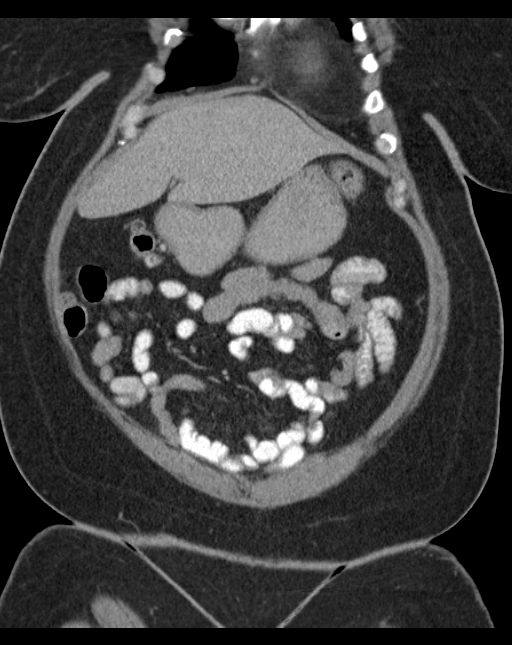
[im 76/171  soft-tissue]
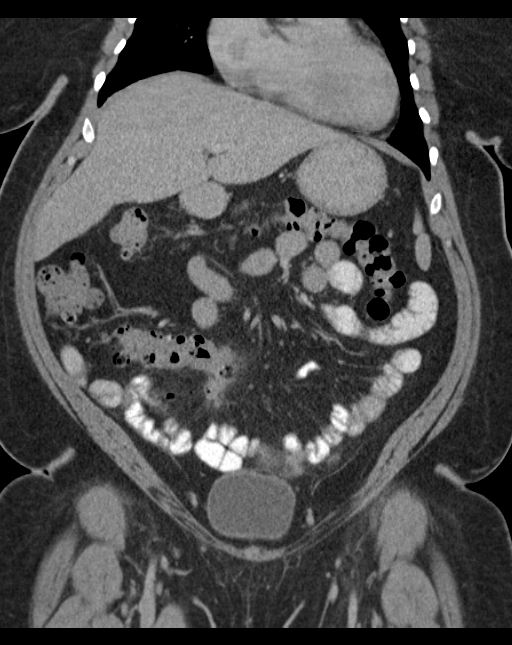
[im 95/171  soft-tissue]
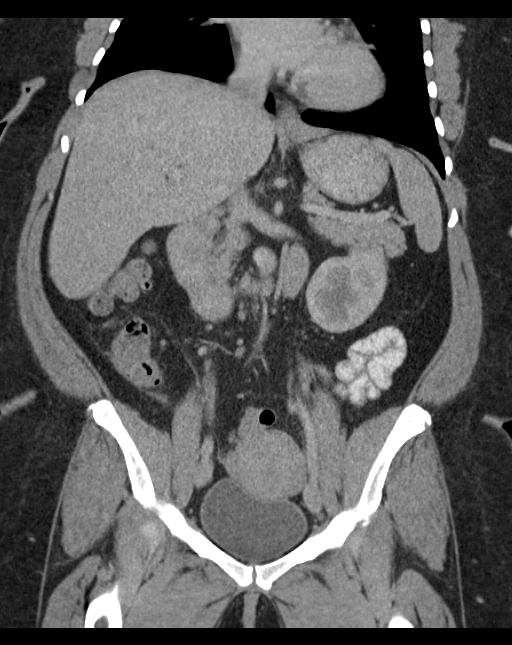

[15 of 46 positions shown; findings below may reference images not displayed]

FINDINGS: Lower chest: There is bibasilar lung atelectasis. Lung bases
otherwise are clear.

Hepatobiliary: There are stable scattered foci of decreased
attenuation throughout the liver measuring 2-3 mm, probably
representing either small cysts or hamartomas. There is a 7 x 6 mm
cyst in the posterior segment right lobe of the liver. There is a
1.0 x 0.8 cm cyst in the right lobe of the liver at the level of the
hepatorenal fossa. No new liver lesions are evident. Gallbladder is
absent. There is no biliary duct dilatation.

Pancreas: No pancreatic mass or inflammatory focus.

Spleen: No splenic lesions are evident.

Adrenals/Urinary Tract: Adrenals appear normal bilaterally. Kidneys
bilaterally show no appreciable mass or hydronephrosis on either
side. There is no renal or ureteral calculus on either side. The
urinary bladder is midline with wall thickness within normal limits.

Stomach/Bowel: There is diverticulosis throughout the sigmoid colon.
In the mid sigmoid colon, located slightly to the right of midline,
there is mesenteric inflammation as well as mild wall thickening
consistent with a degree of diverticulitis. There is no demonstrable
abscess or perforation in the sigmoid region. Elsewhere, there is no
bowel wall or mesenteric thickening. There are scattered diverticula
throughout the right colon and transverse colon without
diverticulitis. There is no free air or portal venous air. No bowel
obstruction.

Vascular/Lymphatic: There are scattered foci of atherosclerotic
calcification in the aorta. There is no abdominal aortic aneurysm.
There is no major mesenteric vessel narrowing or obstruction. There
is no adenopathy in the abdomen or pelvis.

Reproductive: Uterus is anteverted. There is no pelvic mass or
pelvic fluid collection.

Other: The appendix appears normal. There is no abscess or ascites
in the abdomen or pelvis. There is a small ventral hernia containing
only fat.

Musculoskeletal: There are no blastic or lytic bone lesions. No
abdominal wall lesion evident. There is symmetric fatty infiltration
in each tensor fascia lata muscle.
IMPRESSION: Sigmoid diverticulitis, mid to distal sigmoid, primarily to the
right of midline, without abscess or perforation. There is extensive
colonic diverticulosis elsewhere without diverticulitis. No bowel
obstruction.

Elsewhere no abscess or ascites in the abdomen or pelvis. Appendix
appears normal.

No renal or ureteral calculus.  No hydronephrosis.

Gallbladder absent.

Small ventral hernia containing only fat.

## 2017-11-14 ENCOUNTER — Encounter (HOSPITAL_COMMUNITY): Payer: Self-pay | Admitting: Pharmacy Technician

## 2017-11-14 ENCOUNTER — Other Ambulatory Visit: Payer: Self-pay

## 2017-11-14 ENCOUNTER — Emergency Department (HOSPITAL_COMMUNITY)
Admission: EM | Admit: 2017-11-14 | Discharge: 2017-11-14 | Disposition: A | Discharge status: Home / Self Care | Payer: 59 | Attending: Emergency Medicine | Admitting: Emergency Medicine

## 2017-11-14 ENCOUNTER — Emergency Department (HOSPITAL_COMMUNITY): Payer: 59

## 2017-11-14 DIAGNOSIS — I5032 Chronic diastolic (congestive) heart failure: Secondary | ICD-10-CM | POA: Diagnosis not present

## 2017-11-14 DIAGNOSIS — R0789 Other chest pain: Secondary | ICD-10-CM | POA: Diagnosis not present

## 2017-11-14 DIAGNOSIS — I110 Hypertensive heart disease with heart failure: Secondary | ICD-10-CM | POA: Insufficient documentation

## 2017-11-14 DIAGNOSIS — R42 Dizziness and giddiness: Secondary | ICD-10-CM | POA: Diagnosis not present

## 2017-11-14 DIAGNOSIS — Z79899 Other long term (current) drug therapy: Secondary | ICD-10-CM | POA: Insufficient documentation

## 2017-11-14 DIAGNOSIS — Z7901 Long term (current) use of anticoagulants: Secondary | ICD-10-CM | POA: Insufficient documentation

## 2017-11-14 DIAGNOSIS — Z87891 Personal history of nicotine dependence: Secondary | ICD-10-CM | POA: Insufficient documentation

## 2017-11-14 DIAGNOSIS — R61 Generalized hyperhidrosis: Secondary | ICD-10-CM | POA: Diagnosis not present

## 2017-11-14 DIAGNOSIS — R079 Chest pain, unspecified: Secondary | ICD-10-CM | POA: Diagnosis not present

## 2017-11-14 LAB — CBC
HEMATOCRIT: 30.1 % — AB (ref 36.0–46.0)
Hemoglobin: 8.9 g/dL — ABNORMAL LOW (ref 12.0–15.0)
MCH: 20.4 pg — AB (ref 26.0–34.0)
MCHC: 29.6 g/dL — AB (ref 30.0–36.0)
MCV: 68.9 fL — ABNORMAL LOW (ref 78.0–100.0)
Platelets: 364 10*3/uL (ref 150–400)
RBC: 4.37 MIL/uL (ref 3.87–5.11)
RDW: 17.7 % — AB (ref 11.5–15.5)
WBC: 7 10*3/uL (ref 4.0–10.5)

## 2017-11-14 LAB — I-STAT TROPONIN, ED
TROPONIN I, POC: 0 ng/mL (ref 0.00–0.08)
Troponin i, poc: 0 ng/mL (ref 0.00–0.08)

## 2017-11-14 LAB — BASIC METABOLIC PANEL
Anion gap: 11 (ref 5–15)
BUN: 13 mg/dL (ref 6–20)
CALCIUM: 9.2 mg/dL (ref 8.9–10.3)
CO2: 21 mmol/L — AB (ref 22–32)
Chloride: 106 mmol/L (ref 98–111)
Creatinine, Ser: 0.86 mg/dL (ref 0.44–1.00)
GFR calc Af Amer: >60 mL/min (ref >60)
GLUCOSE: 92 mg/dL (ref 70–99)
POTASSIUM: 3.6 mmol/L (ref 3.5–5.1)
Sodium: 138 mmol/L (ref 135–145)

## 2017-11-14 LAB — I-STAT BETA HCG BLOOD, ED (MC, WL, AP ONLY): I-stat hCG, quantitative: <5 m[IU]/mL (ref <5)

## 2017-11-14 MED ORDER — LORAZEPAM 1 MG PO TABS
0.5000 mg | ORAL_TABLET | Freq: Once | ORAL | Status: AC
  Administered 2017-11-14: 0.5 mg via ORAL
  Filled 2017-11-14: qty 1

## 2017-11-14 NOTE — ED Triage Notes (Signed)
Pt arrives via EMS from work with complaints of R sided substernal cp radiating to back and bil arms. Pt with hx of afib and anxiety. Pt endorses nausea and dizziness upon arrival to the ED. EKG unremarkable. 122/67, HR 77 NSR, 99% RA, RR22. Pt appears anxious. 324mg  ASA and 1 NTG given PTA.

## 2017-11-14 NOTE — ED Notes (Signed)
Patient transported to X-ray 

## 2017-11-14 NOTE — ED Provider Notes (Signed)
[facility] EMERGENCY DEPARTMENT Provider Note   CSN: [id number] Arrival date & time: 11/14/17  1252     History   Chief Complaint Chief Complaint  Patient presents with  . Chest Pain    HPI Crystal Brennan is a 52 y.o. female.  52 year old female brought in by EMS for chest pain.  Patient states that she has a sharp and dull pain on the right side of her chest that has been constant for the past several hours while at work.  Patient reports generally feeling unwell, went to use the bathroom at work and felt very dizzy.  States she is also having pain in her right upper back which is tender to the touch as well as soreness in her bilateral forearms, as well as associated nausea.  Denies vomiting, abdominal pain, sweats.  Patient states that she has had the soreness in her forearms consistently for the past 3 weeks without known cause.  Patient experienced pain in her back and chest on Saturday and went to her PCP for anxiety, given Ativan, states she is under a lot of stress at home, last took half of an Ativan yesterday.  Patient took an Excedrin today while at work for her pain, also given nitro and aspirin by EMS, reports no relief in her right sided chest discomfort.  Patient has a history of A. fib, is compliant with her Eliquis, Cardizem, metoprolol.  History of hypertension, denies history of high cholesterol the patient is a former smoker.  Family history significant for MI, mother in her 71s. No other complaints or concerns.      Past Medical History:  Diagnosis Date  . Anemia   . Anxiety   . Arthritis    shoulders   . Diastolic dysfunction    a. 12/2013 Echo: EF 65-70%, no rwma, Gr1 DD, PASP 24mmHg.  . Diverticulitis 2015  . Family history of adverse reaction to anesthesia    mother wakes up during surgery   . H/O cardiovascular stress test    a. 12/2013 MV: no ischemia/infarct, prominent breast attenuation.  . Hypertension   . PAF (paroxysmal atrial  fibrillation) ([place])    a. 12/2013 PAF->TSH nl->converted with Dilt;  b. CHA2DS2VASc = 2-->Eliquis.  . Sleep apnea    cpap - does not know settings     Patient Active Problem List   Diagnosis Date Noted  . Diverticulitis of colon 06/29/2015  . Preop cardiovascular exam 05/11/2015  . Anemia of chronic disease 03/03/2015  . Thrombocytosis ([place]) 03/03/2015  . Benign essential HTN 03/03/2015  . Morbid obesity due to excess calories ([place]) 03/03/2015  . Pelvic abscess in female   . Hypokalemia 02/20/2015  . Chronic diastolic congestive heart failure, NYHA class 1 ([place]) 02/20/2015  . Diverticulitis of large intestine with perforation without bleeding   . OSA (obstructive sleep apnea) 04/08/2014  . PAF (paroxysmal atrial fibrillation) ([place]) 01/01/2014  . Anxiety     Past Surgical History:  Procedure Laterality Date  . [place]; 1986; 1988  . CHOLECYSTECTOMY  2009  . COLON RESECTION N/A 06/29/2015   Procedure:  LAPAROSCOPIC ASSISTED SIGMOID COLECTOMY WITH MOBILIZATION OF SPLENIC  FLEXURE;  Surgeon: Alphonsa Overall, MD;  Location: [facility];  Service: General;  Laterality: N/A;  . cyst removed from right ovary      age 14   . TUBAL LIGATION  198     OB History   None      Home Medications  Prior to Admission medications   Medication Sig Start Date End Date Taking? Authorizing Provider  diltiazem (CARDIZEM CD) 180 MG 24 hr capsule Take 1 capsule (180 mg total) by mouth daily. 01/26/17   Lelon Perla, MD  LORazepam (ATIVAN) 1 MG tablet Take 0.5 tablets (0.5 mg total) by mouth 2 (two) times daily. 08/06/16   Orlie Dakin, MD  meclizine (ANTIVERT) 25 MG tablet Take 1 tablet (25 mg total) by mouth 3 (three) times daily as needed for dizziness. 03/06/17   Sherwood Gambler, MD  metoprolol tartrate (LOPRESSOR) 50 MG tablet Take 1 tablet (50 mg total) by mouth 2 (two) times daily. Please keep upcoming appt for further refills. 10/09/17   Lelon Perla, MD  naproxen sodium  (ALEVE) 220 MG tablet Take 880 mg by mouth daily as needed (PAIN).    [provider]  ondansetron (ZOFRAN) 4 MG tablet Take 4 mg by mouth every 6 (six) hours as needed for nausea or vomiting.    [provider]  rivaroxaban (XARELTO) 20 MG TABS tablet TAKE 1 TABLET BY MOUTH ONCE DAILY WITH  SUPPER  **NEED FOLLOW UP PRIOR TO ADDITIONAL  REFILL** 10/09/17   Lelon Perla, MD    Family History Family History  Problem Relation Age of Onset  . Heart attack Mother        ? in her 16's - never sought treatment  . Pulmonary embolism Mother   . Other Father        ? hx  . Lupus Sister   . Heart failure Sister     Social History Social History   Tobacco Use  . Smoking status: Former Smoker    Packs/day: 0.33    Years: 30.00    Pack years: 9.90    Types: Cigarettes    Last attempt to quit: 01/01/2015    Years since quitting: 2.8  . Smokeless tobacco: Never Used  Substance Use Topics  . Alcohol use: No    Alcohol/week: 0.0 standard drinks  . Drug use: No     Allergies   Dilaudid [hydromorphone hcl]; Benadryl [diphenhydramine hcl]; Other; Codeine; and Percocet [oxycodone-acetaminophen]   Review of Systems Review of Systems  Constitutional: Negative for chills, diaphoresis and fever.  Respiratory: Negative for shortness of breath.   Cardiovascular: Positive for chest pain.  Gastrointestinal: Positive for nausea. Negative for abdominal pain, constipation, diarrhea and vomiting.  Genitourinary: Negative for difficulty urinating and dysuria.  Musculoskeletal: Positive for back pain and myalgias.  Skin: Negative for rash and wound.  Allergic/Immunologic: Negative for immunocompromised state.  Neurological: Positive for dizziness. Negative for weakness.  Hematological: Negative for adenopathy. Does not bruise/bleed easily.  Psychiatric/Behavioral: Negative for confusion. The patient is nervous/anxious.   All other systems reviewed and are  negative.    Physical Exam Updated Vital Signs BP 105/66   Pulse 77   Temp 98 F (36.7 C) (Oral)   Resp (!) 26   LMP 10/24/2017   SpO2 99%   Physical Exam  Constitutional: She is oriented to person, place, and time. She appears well-developed and well-nourished. No distress.  HENT:  Head: Normocephalic and atraumatic.  Cardiovascular: Normal rate, regular rhythm, intact distal pulses and normal pulses.  Pulmonary/Chest: Effort normal.  Abdominal: Soft. There is no tenderness.  Musculoskeletal:       Back:       Right lower leg: She exhibits tenderness.  Palpation of right upper back reproduces patient's back pain.  Neurological: She is alert and  oriented to person, place, and time.  Skin: Skin is warm and dry. She is not diaphoretic.  Psychiatric: She has a normal mood and affect. Her behavior is normal.  Nursing note and vitals reviewed.    ED Treatments / Results  Labs (all labs ordered are listed, but only abnormal results are displayed) Labs Reviewed  BASIC METABOLIC PANEL - Abnormal; Notable for the following components:      Result Value   CO2 21 (*)    All other components within normal limits  CBC - Abnormal; Notable for the following components:   Hemoglobin 8.9 (*)    HCT 30.1 (*)    MCV 68.9 (*)    MCH 20.4 (*)    MCHC 29.6 (*)    RDW 17.7 (*)    All other components within normal limits  I-STAT TROPONIN, ED  I-STAT BETA HCG BLOOD, ED ([facility], [facility], [facility] ONLY)  I-STAT TROPONIN, ED    EKG EKG Interpretation  Date/Time:  Wednesday November 14 2017 12:55:25 EDT Ventricular Rate:  72 PR Interval:    QRS Duration: 76 QT Interval:  399 QTC Calculation: 437 R Axis:   47 Text Interpretation:  Sinus rhythm Low voltage, precordial leads since last tracing no significant change Confirmed by Daleen Bo [phone]) on 11/14/2017 1:34:03 PM Also confirmed by Daleen Bo [phone]), editor [name] [phone])  on 11/14/2017 3:28:57 PM   Radiology Dg Chest 2  View  Result Date: 11/14/2017 CLINICAL DATA:  August 06, 2016 EXAM: CHEST - 2 VIEW COMPARISON:  None. FINDINGS: The heart size and mediastinal contours are within normal limits. Both lungs are clear. The visualized skeletal structures are unremarkable. IMPRESSION: No active cardiopulmonary disease. Electronically Signed   By: Dorise Bullion III M.D   On: 11/14/2017 13:25    Procedures Procedures (including critical care time)  Medications Ordered in ED Medications  LORazepam (ATIVAN) tablet 0.5 mg (0.5 mg Oral Given 11/14/17 1557)     Initial Impression / Assessment and Plan / ED Course  I have reviewed the triage vital signs and the nursing notes.  Pertinent labs & imaging results that were available during my care of the patient were reviewed by me and considered in my medical decision making (see chart for details).  Clinical Course as of Nov 15 1907  Wed Nov 14, 2144  5148 52 year old female with complaint of right-sided chest pain.  On exam patient is anxious appearing, has tenderness in her right upper back area, no chest wall tenderness, heart rate regular rate and rhythm, lung sounds clear.  Patient reports history of anxiety and was not sure if there pain is related to her anxiety or her heart.   [LM]  1904 HEART score 3, troponin x 2 negative. Recommend recheck with her PCP this week, return to the ER for any concerns. Pt reports symptoms have resolved with Ativan in the Er, states the 0.5mg  given in the ER work better than her rx of same.    [LM]    Clinical Course User Index [LM] Tacy Learn, PA-C   Final Clinical Impressions(s) / ED Diagnoses   Final diagnoses:  Other chest pain    ED Discharge Orders    None       Roque Lias 11/14/17 1909    Daleen Bo, MD 11/16/17 [phone]

## 2017-11-14 NOTE — Discharge Instructions (Addendum)
Follow-up with your doctor this week.  Return to ER for any worsening or concerning symptoms.

## 2017-11-14 NOTE — ED Notes (Signed)
PA made aware pt requesting pain medication

## 2017-11-14 NOTE — ED Notes (Signed)
Patient verbalizes understanding of discharge instructions. Opportunity for questioning and answers were provided. Ambulatory at discharge in NAD.  

## 2017-11-16 DIAGNOSIS — M542 Cervicalgia: Secondary | ICD-10-CM | POA: Diagnosis not present

## 2017-11-16 DIAGNOSIS — N921 Excessive and frequent menstruation with irregular cycle: Secondary | ICD-10-CM | POA: Diagnosis not present

## 2017-11-16 DIAGNOSIS — D649 Anemia, unspecified: Secondary | ICD-10-CM | POA: Diagnosis not present

## 2017-11-19 DIAGNOSIS — M546 Pain in thoracic spine: Secondary | ICD-10-CM | POA: Diagnosis not present

## 2017-12-05 NOTE — Progress Notes (Signed)
HPI: FU atrial fibrillation; admitted 11/15 with new onset atrial fibrillation; TSH normal. Lexiscan cardiolite was completed and negative for ischemia; EF 78. Echocardiogram revealed an EF of 65-70% with normal wall motion. Mild concentric hypertrophy. G1DD. Peak PA pressure of 26mmHg. Converted with cardizem.  Seen in the emergency room October 2019 with atypical chest pain.  Chest x-ray negative.  Troponins normal.  Hemoglobin 8.9 with MCV 68.9.  Symptoms improved with Ativan.  Since last seen, she denies exertional chest pain, dyspnea on exertion or syncope.  Occasional brief palpitations.  She has heavy menstrual cycles.  Current Outpatient Medications  Medication Sig Dispense Refill  . diltiazem (CARDIZEM CD) 180 MG 24 hr capsule Take 1 capsule (180 mg total) by mouth daily. 180 capsule 1  . LORazepam (ATIVAN) 0.5 MG tablet TAKE 1 2 (ONE HALF) TABLET BY MOUTH TWICE DAILY AS NEEDED  0  . meclizine (ANTIVERT) 25 MG tablet Take 1 tablet (25 mg total) by mouth 3 (three) times daily as needed for dizziness. 30 tablet 0  . metoprolol tartrate (LOPRESSOR) 50 MG tablet Take 1 tablet (50 mg total) by mouth 2 (two) times daily. Please keep upcoming appt for further refills. 60 tablet 1  . naproxen sodium (ALEVE) 220 MG tablet Take 880 mg by mouth daily as needed (PAIN).    . rivaroxaban (XARELTO) 20 MG TABS tablet TAKE 1 TABLET BY MOUTH ONCE DAILY WITH  SUPPER  **NEED FOLLOW UP PRIOR TO ADDITIONAL  REFILL** 30 tablet 1   No current facility-administered medications for this visit.      Past Medical History:  Diagnosis Date  . Anemia   . Anxiety   . Arthritis    shoulders   . Diastolic dysfunction    a. 12/2013 Echo: EF 65-70%, no rwma, Gr1 DD, PASP 36mmHg.  . Diverticulitis 2015  . Family history of adverse reaction to anesthesia    mother wakes up during surgery   . H/O cardiovascular stress test    a. 12/2013 MV: no ischemia/infarct, prominent breast attenuation.  . Hypertension    . PAF (paroxysmal atrial fibrillation) ([place])    a. 12/2013 PAF->TSH nl->converted with Dilt;  b. CHA2DS2VASc = 2-->Eliquis.  . Sleep apnea    cpap - does not know settings     Past Surgical History:  Procedure Laterality Date  . [place]; 1986; 1988  . CHOLECYSTECTOMY  2009  . COLON RESECTION N/A 06/29/2015   Procedure:  LAPAROSCOPIC ASSISTED SIGMOID COLECTOMY WITH MOBILIZATION OF SPLENIC  FLEXURE;  Surgeon: Alphonsa Overall, MD;  Location: [facility];  Service: General;  Laterality: N/A;  . cyst removed from right ovary      age 43   . TUBAL LIGATION  198    Social History   Socioeconomic History  . Marital status: Married    Spouse name: Not on file  . Number of children: Not on file  . Years of education: Not on file  . Highest education level: Not on file  Occupational History  . Not on file  Social Needs  . Financial resource strain: Not on file  . Food insecurity:    Worry: Not on file    Inability: Not on file  . Transportation needs:    Medical: Not on file    Non-medical: Not on file  Tobacco Use  . Smoking status: Former Smoker    Packs/day: 0.33    Years: 30.00    Pack years: 9.90  Types: Cigarettes    Last attempt to quit: 01/01/2015    Years since quitting: 2.9  . Smokeless tobacco: Never Used  Substance and Sexual Activity  . Alcohol use: No    Alcohol/week: 0.0 standard drinks  . Drug use: No  . Sexual activity: Not on file  Lifestyle  . Physical activity:    Days per week: Not on file    Minutes per session: Not on file  . Stress: Not on file  Relationships  . Social connections:    Talks on phone: Not on file    Gets together: Not on file    Attends religious service: Not on file    Active member of club or organization: Not on file    Attends meetings of clubs or organizations: Not on file    Relationship status: Not on file  . Intimate partner violence:    Fear of current or ex partner: Not on file    Emotionally abused: Not  on file    Physically abused: Not on file    Forced sexual activity: Not on file  Other Topics Concern  . Not on file  Social History Narrative   Lives in [place] with fiance.  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  Walks regularly.  Trying to lose weight.    Family History  Problem Relation Age of Onset  . Heart attack Mother        ? in her 71's - never sought treatment  . Pulmonary embolism Mother   . Other Father        ? hx  . Lupus Sister   . Heart failure Sister     ROS: no fevers or chills, productive cough, hemoptysis, dysphasia, odynophagia, melena, hematochezia, dysuria, hematuria, rash, seizure activity, orthopnea, PND, pedal edema, claudication. Remaining systems are negative.  Physical Exam: Well-developed obese in no acute distress.  Skin is warm and dry.  HEENT is normal.  Neck is supple.  Chest is clear to auscultation with normal expansion.  Cardiovascular exam is regular rate and rhythm.  Abdominal exam nontender or distended. No masses palpated. Extremities show no edema. neuro grossly intact  ECG-November 14, 2017-sinus rhythm with no ST changes.  Personally reviewed  A/P  1 paroxysmal atrial fibrillation-patient remains in sinus rhythm on examination today.  We will continue with present dose of beta-blocker and Cardizem for rate control if atrial fibrillation recurs.  Continue xarelto.  2 hypertension-blood pressure is controlled.  Continue present medications and follow.  3 history of atypical chest pain-symptoms are extremely atypical.  Will not pursue further ischemia evaluation at this point.  4 microcytic anemia-noted on recent laboratories.  Likely from heavy menstrual cycles.  I have asked her to follow-up with primary care and GYN.  5 obesity-pt is actively dieting and exercising for weight loss.  Kirk Ruths, MD

## 2017-12-11 ENCOUNTER — Encounter: Payer: Self-pay | Admitting: Cardiology

## 2017-12-11 ENCOUNTER — Ambulatory Visit: Payer: 59 | Admitting: Cardiology

## 2017-12-11 VITALS — BP 122/64 | HR 63 | Resp 16 | Ht 61.0 in | Wt 250.8 lb

## 2017-12-11 DIAGNOSIS — R079 Chest pain, unspecified: Secondary | ICD-10-CM

## 2017-12-11 DIAGNOSIS — I10 Essential (primary) hypertension: Secondary | ICD-10-CM

## 2017-12-11 DIAGNOSIS — I480 Paroxysmal atrial fibrillation: Secondary | ICD-10-CM | POA: Diagnosis not present

## 2017-12-11 MED ORDER — METOPROLOL TARTRATE 50 MG PO TABS: 50.0000 mg | ORAL_TABLET | Freq: Two times a day (BID) | ORAL | 3 refills | Status: DC

## 2017-12-11 MED ORDER — DILTIAZEM HCL ER COATED BEADS 180 MG PO CP24: 180.0000 mg | ORAL_CAPSULE | Freq: Every day | ORAL | 3 refills | Status: DC

## 2017-12-11 MED ORDER — RIVAROXABAN 20 MG PO TABS: ORAL_TABLET | ORAL | 3 refills | Status: DC

## 2017-12-11 NOTE — Patient Instructions (Signed)

## 2018-12-24 ENCOUNTER — Other Ambulatory Visit: Payer: Self-pay | Admitting: Cardiology

## 2018-12-24 DIAGNOSIS — I10 Essential (primary) hypertension: Secondary | ICD-10-CM

## 2018-12-24 NOTE — Progress Notes (Deleted)
Cardiology Office Note   Date:  12/24/2018   ID:  Crystal Brennan, DOB [birth year], MRN [id number]  PCP:  [facility] [place]  Cardiologist:  Dr. Stanford Breed  No chief complaint on file.    History of Present Illness: Crystal Brennan is a 53 y.o. female who presents for ongoing assessment and management of atrial fibrillation, Lexiscan Cardiolite completed was negative for ischemia with an EF of 78%.  Echocardiogram revealed an EF of 65% to 70% with normal wall motion on most recent evaluation in 2015.  She did have mild concentric hypertrophy with grade 1 diastolic dysfunction with a peak PA pressure 40 mmHg.  The patient was converted to normal sinus rhythm with diltiazem.  She was seen last by Dr. Stanford Breed on 12/11/2017 and was asymptomatic at that time.  She did admit to heavy periods on Xarelto CHADS VASC Score of 2.  She was continued on diltiazem and metoprolol.    Past Medical History:  Diagnosis Date  . Anemia   . Anxiety   . Arthritis    shoulders   . Diastolic dysfunction    a. 12/2013 Echo: EF 65-70%, no rwma, Gr1 DD, PASP 37mmHg.  . Diverticulitis 2015  . Family history of adverse reaction to anesthesia    mother wakes up during surgery   . H/O cardiovascular stress test    a. 12/2013 MV: no ischemia/infarct, prominent breast attenuation.  . Hypertension   . PAF (paroxysmal atrial fibrillation) ([place])    a. 12/2013 PAF->TSH nl->converted with Dilt;  b. CHA2DS2VASc = 2-->Eliquis.  . Sleep apnea    cpap - does not know settings     Past Surgical History:  Procedure Laterality Date  . [place]; 1986; 1988  . CHOLECYSTECTOMY  2009  . COLON RESECTION N/A 06/29/2015   Procedure:  LAPAROSCOPIC ASSISTED SIGMOID COLECTOMY WITH MOBILIZATION OF SPLENIC  FLEXURE;  Surgeon: Alphonsa Overall, MD;  Location: [facility];  Service: General;  Laterality: N/A;  . cyst removed from right ovary      age 52   . TUBAL LIGATION  198     Current Outpatient  Medications  Medication Sig Dispense Refill  . CARTIA XT 180 MG 24 hr capsule Take 1 capsule by mouth once daily 30 capsule 0  . LORazepam (ATIVAN) 0.5 MG tablet TAKE 1 2 (ONE HALF) TABLET BY MOUTH TWICE DAILY AS NEEDED  0  . meclizine (ANTIVERT) 25 MG tablet Take 1 tablet (25 mg total) by mouth 3 (three) times daily as needed for dizziness. 30 tablet 0  . metoprolol tartrate (LOPRESSOR) 50 MG tablet Take 1 tablet by mouth twice daily 60 tablet 0  . naproxen sodium (ALEVE) 220 MG tablet Take 880 mg by mouth daily as needed (PAIN).    . rivaroxaban (XARELTO) 20 MG TABS tablet TAKE 1 TABLET BY MOUTH ONCE DAILY WITH  SUPPER 90 tablet 3   No current facility-administered medications for this visit.     Allergies:   Dilaudid [hydromorphone hcl], Benadryl [diphenhydramine hcl], Buspar [buspirone], Other, Codeine, and Percocet [oxycodone-acetaminophen]    Social History:  The patient  reports that she quit smoking about 3 years ago. Her smoking use included cigarettes. She has a 9.90 pack-year smoking history. She has never used smokeless tobacco. She reports that she does not drink alcohol or use drugs.   Family History:  The patient's family history includes Heart attack in her mother; Heart failure in her sister; Lupus in her sister; Other in  her father; Pulmonary embolism in her mother.    ROS: All other systems are reviewed and negative. Unless otherwise mentioned in H&P    PHYSICAL EXAM: VS:  There were no vitals taken for this visit. , BMI There is no height or weight on file to calculate BMI. GEN: Well nourished, well developed, in no acute distress HEENT: normal Neck: no JVD, carotid bruits, or masses Cardiac: ***RRR; no murmurs, rubs, or gallops,no edema  Respiratory:  Clear to auscultation bilaterally, normal work of breathing GI: soft, nontender, nondistended, + BS MS: no deformity or atrophy Skin: warm and dry, no rash Neuro:  Strength and sensation are intact Psych: euthymic  mood, full affect   EKG:  EKG {ACTION; IS/IS [id number] ordered today. The ekg ordered today demonstrates ***   Recent Labs: No results found for requested labs within last 8760 hours.    Lipid Panel    Component Value Date/Time   CHOL 168 02-01-2014 0159   TRIG 158 (H) 03/02/2015 0455   HDL 43 2014/02/01 0159   CHOLHDL 3.9 02/01/14 0159   VLDL 32 02/01/14 0159   LDLCALC 93 02/01/2014 0159      Wt Readings from Last 3 Encounters:  12/11/17 250 lb 12.8 oz (113.8 kg)  03/06/17 260 lb (117.9 kg)  08/08/16 257 lb (116.6 kg)      Other studies Reviewed: Echocardiogram 02/01/14 Left ventricle: The cavity size was normal. There was mild  concentric hypertrophy. Systolic function was vigorous. The  estimated ejection fraction was in the range of 65% to 70%. Wall  motion was normal; there were no regional wall motion  abnormalities. Doppler parameters are consistent with abnormal  left ventricular relaxation (grade 1 diastolic dysfunction).  - Pulmonary arteries: Systolic pressure was mildly increased. PA  peak pressure: 40 mm Hg (S).   ASSESSMENT AND PLAN:  1.  ***   Current medicines are reviewed at length with the patient today.    Labs/ tests ordered today include: *** Phill Myron. [place] Pugh, ANP, AACC   12/24/2018 11:57 AM    [facility] [place] [address] Office [phone] Fax [phone]  Notice: This dictation was prepared with Dragon dictation along with smaller phrase technology. Any transcriptional errors that result from this process are unintentional and may not be corrected upon review.

## 2018-12-24 NOTE — Telephone Encounter (Signed)
Please review for refill. Thanks!  

## 2018-12-25 ENCOUNTER — Ambulatory Visit: Payer: 59 | Admitting: Adult Health

## 2018-12-29 DIAGNOSIS — M79602 Pain in left arm: Secondary | ICD-10-CM | POA: Diagnosis not present

## 2018-12-31 DIAGNOSIS — M25512 Pain in left shoulder: Secondary | ICD-10-CM | POA: Diagnosis not present

## 2018-12-31 DIAGNOSIS — M67912 Unspecified disorder of synovium and tendon, left shoulder: Secondary | ICD-10-CM | POA: Diagnosis not present

## 2018-12-31 DIAGNOSIS — M546 Pain in thoracic spine: Secondary | ICD-10-CM | POA: Diagnosis not present

## 2019-01-05 NOTE — Progress Notes (Signed)
Cardiology Clinic Note   Patient Name: Crystal Brennan Date of Encounter: 01/06/2019  Primary Care Provider:  [facility] [place] [facility] Cardiologist:  Kirk Ruths, MD  Patient Profile    Crystal Brennan 53 year old female presents today for follow-up of her paroxysmal atrial fibrillation, chronic diastolic congestive heart failure, essential hypertension, and OSA.    Past Medical History    Past Medical History:  Diagnosis Date   Anemia    Anxiety    Arthritis    shoulders    Diastolic dysfunction    a. 12/2013 Echo: EF 65-70%, no rwma, Gr1 DD, PASP 52mmHg.   Diverticulitis 2015   Family history of adverse reaction to anesthesia    mother wakes up during surgery    H/O cardiovascular stress test    a. 12/2013 MV: no ischemia/infarct, prominent breast attenuation.   Hypertension    PAF (paroxysmal atrial fibrillation) ([place])    a. 12/2013 PAF->TSH nl->converted with Dilt;  b. CHA2DS2VASc = 2-->Eliquis.   Sleep apnea    cpap - does not know settings    Past Surgical History:  Procedure Laterality Date   CESAREAN SECTION  1983; 1986; 1988   CHOLECYSTECTOMY  2009   COLON RESECTION N/A 06/29/2015   Procedure:  LAPAROSCOPIC ASSISTED SIGMOID COLECTOMY WITH MOBILIZATION OF SPLENIC  FLEXURE;  Surgeon: Alphonsa Overall, MD;  Location: [facility];  Service: General;  Laterality: N/A;   cyst removed from right ovary      age 66    TUBAL LIGATION  198    Allergies  Allergies  Allergen Reactions   Dilaudid [Hydromorphone Hcl] Other (See Comments)    "feels like she dying '   Benadryl [Diphenhydramine Hcl] Hives and Itching   Buspar [Buspirone] Other (See Comments)    Vertigo    Other Other (See Comments)    No blood products for religious reasons   Codeine Nausea And Vomiting   Percocet [Oxycodone-Acetaminophen] Hives and Itching    History of Present Illness    Ms. Hurtado has past medical history of atrial fibrillation; with an  admission on 11/15 due to new onset A. fib.  Her TSH was normal at the time.  A Lexiscan was completed and was negative for ischemia, her EF 78%.  An echocardiogram showed an LVEF 65 to 70% with normal wall motion, mild concentric hypertrophy, grade 1 diastolic dysfunction, and peak PA pressures of 40 mmHg.  She has been successfully converted using Cardizem in the past.  She was seen in the ED on 12/2017 with atypical chest pain.  Her chest x-ray was negative.  Her troponins were normal.  Her hemoglobin was 8.9 and MCV 68.9.  Her symptoms improved with administration of Ativan.  She was last seen by Dr. Stanford Breed on 12/11/2017.  During that time she felt well and denied exertional chest pain, dyspnea on exertion, and syncope.  She did note having occasional brief palpitations and suffered from heavy menstrual cycles.  She presents the clinic today and states she has been doing well.  She notices that occasionally in the morning she has a fast heart rate.  When she has the fast heart rates she takes her morning medications which lower her heart back to a normal rate.  She states she has lost around 12 pounds since October and is following a low-sodium diet.  She exercises regularly only and has a 30-minute exercise routine that she does on most days through the week.  She states that a few  weeks ago she experienced severe left shoulder pain, went to urgent care and was sent to orthopedics.  The orthopedic office diagnosed her with a rotator cuff tear and prescribed a cortisone injection along with physical therapy.  I will check her BMP today and have her follow-up in 1 year.  She denies chest pain, shortness of breath, lower extremity edema, fatigue,  melena, hematuria, hemoptysis, diaphoresis, weakness, presyncope, syncope, orthopnea, and PND.   Home Medications    Prior to Admission medications   Medication Sig Start Date End Date Taking? Authorizing Provider  CARTIA XT 180 MG 24 hr capsule Take 1  capsule by mouth once daily 12/24/18   Lelon Perla, MD  LORazepam (ATIVAN) 0.5 MG tablet TAKE 1 2 (ONE HALF) TABLET BY MOUTH TWICE DAILY AS NEEDED 11/10/17   [provider]  meclizine (ANTIVERT) 25 MG tablet Take 1 tablet (25 mg total) by mouth 3 (three) times daily as needed for dizziness. 03/06/17   Sherwood Gambler, MD  metoprolol tartrate (LOPRESSOR) 50 MG tablet Take 1 tablet by mouth twice daily 12/24/18   Lelon Perla, MD  naproxen sodium (ALEVE) 220 MG tablet Take 880 mg by mouth daily as needed (PAIN).    [provider]  rivaroxaban (XARELTO) 20 MG TABS tablet Take 1 tablet (20 mg total) by mouth daily with supper. APPOINTMENT FOR LABS NEEDED FOR FURTHER REFILLS 12/24/18   Lelon Perla, MD    Family History    Family History  Problem Relation Age of Onset   Heart attack Mother        ? in her 54's - never sought treatment   Pulmonary embolism Mother    Other Father        ? hx   Lupus Sister    Heart failure Sister    She indicated that the status of her mother is unknown. She indicated that the status of her father is unknown.  Social History    Social History   Socioeconomic History   Marital status: Married    Spouse name: Not on file   Number of children: Not on file   Years of education: Not on file   Highest education level: Not on file  Occupational History   Not on file  Social Needs   Financial resource strain: Not on file   Food insecurity    Worry: Not on file    Inability: Not on file   Transportation needs    Medical: Not on file    Non-medical: Not on file  Tobacco Use   Smoking status: Former Smoker    Packs/day: 0.33    Years: 30.00    Pack years: 9.90    Types: Cigarettes    Quit date: 01/01/2015    Years since quitting: 4.0   Smokeless tobacco: Never Used  Substance and Sexual Activity   Alcohol use: No    Alcohol/week: 0.0 standard drinks   Drug use: No   Sexual activity: Not on file    Lifestyle   Physical activity    Days per week: Not on file    Minutes per session: Not on file   Stress: Not on file  Relationships   Social connections    Talks on phone: Not on file    Gets together: Not on file    Attends religious service: Not on file    Active member of club or organization: Not on file    Attends meetings of clubs or  organizations: Not on file    Relationship status: Not on file   Intimate partner violence    Fear of current or ex partner: Not on file    Emotionally abused: Not on file    Physically abused: Not on file    Forced sexual activity: Not on file  Other Topics Concern   Not on file  Social History Narrative   Lives in [place] with fiance.  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  Walks regularly.  Trying to lose weight.     Review of Systems    General:  No chills, fever, night sweats or weight changes.  Cardiovascular:  No chest pain, dyspnea on exertion, edema, orthopnea, palpitations, paroxysmal nocturnal dyspnea. Dermatological: No rash, lesions/masses Respiratory: No cough, dyspnea Urologic: No hematuria, dysuria Abdominal:   No nausea, vomiting, diarrhea, bright red blood per rectum, melena, or hematemesis Neurologic:  No visual changes, wkns, changes in mental status. All other systems reviewed and are otherwise negative except as noted above.  Physical Exam    VS:  BP 133/67    Pulse 68    Temp (!) 97 F (36.1 C)    Ht 5\' 1"  (1.549 m)    Wt 256 lb 12.8 oz (116.5 kg)    SpO2 95%    BMI 48.52 kg/m  , BMI Body mass index is 48.52 kg/m. GEN: Well nourished, well developed, in no acute distress. HEENT: normal. Neck: Supple, no JVD, carotid bruits, or masses. Cardiac: RRR, no murmurs, rubs, or gallops. No clubbing, cyanosis, edema.  Radials/DP/PT 2+ and equal bilaterally.  Respiratory:  Respirations regular and unlabored, clear to auscultation bilaterally. GI: Soft, nontender, nondistended, BS + x 4. MS: no deformity  or atrophy. Skin: warm and dry, no rash. Neuro:  Strength and sensation are intact. Psych: Normal affect.  Accessory Clinical Findings    ECG personally reviewed by me today-sinus bradycardia with sinus arrhythmia 56 bpm- No acute changes  EKG 11/15/2017 Normal sinus rhythm 72 bpm  Echocardiogram 01/02/2014 Study Conclusions   - Left ventricle: The cavity size was normal. There was mild  concentric hypertrophy. Systolic function was vigorous. The  estimated ejection fraction was in the range of 65% to 70%. Wall  motion was normal; there were no regional wall motion  abnormalities. Doppler parameters are consistent with abnormal  left ventricular relaxation (grade 1 diastolic dysfunction).  - Pulmonary arteries: Systolic pressure was mildly increased. PA  peak pressure: 40 mm Hg (S).  Assessment & Plan   1.  Paroxysmal atrial fibrillation-EKG today sinus bradycardia with sinus arrhythmia 56 bpm Continue metoprolol tartrate 50 mg tablet twice daily Continue Cardizem 180 mg daily Continue Xarelto 20 mg daily Avoid triggers caffeine, alcohol, stimulants  Essential hypertension-BP today 133/67.  Well-controlled at home Continue metoprolol tartrate 50 mg twice daily Continue heart healthy low-sodium diet-salty 6 given Maintain physical activity-goal 150 minutes of moderate physical activity per week Order BMP  Atypical chest pain-history of atypical chest pain no current chest pain today. Continue to monitor  Obesity-BMI 48.5.  Weight today 256.8 down from 260 03/06/2017. Continue increase physical activity Continue weight loss Continue heart healthy low-sodium diet-salty 6 given  Disposition: Follow-up with Dr. Stanford Breed in 1 year.  Jossie Ng. [facility] [place] [address] Office [phone] Fax [phone]

## 2019-01-06 ENCOUNTER — Ambulatory Visit (INDEPENDENT_AMBULATORY_CARE_PROVIDER_SITE_OTHER): Payer: BC Managed Care – PPO | Admitting: General Practice

## 2019-01-06 ENCOUNTER — Other Ambulatory Visit: Payer: Self-pay

## 2019-01-06 ENCOUNTER — Encounter: Payer: Self-pay | Admitting: General Practice

## 2019-01-06 VITALS — BP 133/67 | HR 68 | Temp 97.0°F | Ht 61.0 in | Wt 256.8 lb

## 2019-01-06 DIAGNOSIS — Z79899 Other long term (current) drug therapy: Secondary | ICD-10-CM | POA: Diagnosis not present

## 2019-01-06 DIAGNOSIS — I10 Essential (primary) hypertension: Secondary | ICD-10-CM | POA: Diagnosis not present

## 2019-01-06 DIAGNOSIS — I480 Paroxysmal atrial fibrillation: Secondary | ICD-10-CM

## 2019-01-06 DIAGNOSIS — R0789 Other chest pain: Secondary | ICD-10-CM

## 2019-01-06 LAB — BASIC METABOLIC PANEL
BUN/Creatinine Ratio: 20 (ref 9–23)
BUN: 19 mg/dL (ref 6–24)
CO2: 19 mmol/L — ABNORMAL LOW (ref 20–29)
Calcium: 9.5 mg/dL (ref 8.7–10.2)
Chloride: 105 mmol/L (ref 96–106)
Creatinine, Ser: 0.97 mg/dL (ref 0.57–1.00)
GFR calc Af Amer: 76 mL/min/{1.73_m2} (ref >59)
GFR calc non Af Amer: 66 mL/min/{1.73_m2} (ref >59)
Glucose: 94 mg/dL (ref 65–99)
Potassium: 4.2 mmol/L (ref 3.5–5.2)
Sodium: 139 mmol/L (ref 134–144)

## 2019-01-06 NOTE — Patient Instructions (Signed)
Follow-Up: IN 12 months Please call our office 2 months in advance, OCT 2021 to schedule this NOV 2021 appointment. Either In Person or Virtual You may see Kirk Ruths, MD, Coletta Memos, FNP or one of the following Advanced Practice Providers on your designated Care Team:  Kerin Ransom, PA-C  [place], [place]  Coletta Memos, [place].    LABS: BMET TODAY  PLEASE READ AND FOLLOW THE SALTY 6 ATTACHED  Medication Instructions:  The current medical regimen is effective;  continue present plan and medications as directed. Please refer to the Current Medication list given to you today. If you need a refill on your cardiac medications before your next appointment, please call your pharmacy.  At [facility], you and your health needs are our priority.  As part of our continuing mission to provide you with exceptional heart care, we have created designated Provider Care Teams.  These Care Teams include your primary Cardiologist (physician) and Advanced Practice Providers (APPs -  Physician Assistants and Nurse Practitioners) who all work together to provide you with the care you need, when you need it.  Thank you for choosing [facility] at [facility]!!

## 2019-01-08 NOTE — Progress Notes (Signed)
The patient has been notified of the result and verbalized understanding.  All questions (if any) were answered. Jacqulynn Cadet, [place] 01/08/2019 10:42 AM

## 2019-01-27 ENCOUNTER — Other Ambulatory Visit: Payer: Self-pay

## 2019-01-27 ENCOUNTER — Other Ambulatory Visit: Payer: Self-pay | Admitting: Cardiology

## 2019-01-27 DIAGNOSIS — I10 Essential (primary) hypertension: Secondary | ICD-10-CM

## 2019-01-27 MED ORDER — METOPROLOL TARTRATE 50 MG PO TABS: 50.0000 mg | ORAL_TABLET | Freq: Two times a day (BID) | ORAL | 3 refills | Status: DC

## 2019-01-27 MED ORDER — RIVAROXABAN 20 MG PO TABS: 20.0000 mg | ORAL_TABLET | Freq: Every day | ORAL | 3 refills | Status: DC

## 2019-01-27 MED ORDER — DILTIAZEM HCL ER COATED BEADS 180 MG PO CP24: 180.0000 mg | ORAL_CAPSULE | Freq: Every day | ORAL | 3 refills | Status: DC

## 2019-01-27 NOTE — Telephone Encounter (Signed)
Outpatient Medication Detail   Disp Refills Start End   rivaroxaban (XARELTO) 20 MG TABS tablet 90 tablet 3 01/27/2019    Sig - Route: Take 1 tablet (20 mg total) by mouth daily with supper. - Oral   Sent to pharmacy as: rivaroxaban (XARELTO) 20 MG Tab tablet   E-Prescribing Status: Receipt confirmed by pharmacy (01/27/2019  9:07 AM EST)   Pharmacy  [place], [place]

## 2019-01-27 NOTE — Telephone Encounter (Signed)
Refill Crystal Brennan  

## 2019-01-27 NOTE — Telephone Encounter (Signed)
Outpatient Medication Detail   Disp Refills Start End   diltiazem (CARTIA XT) 180 MG 24 hr capsule 90 capsule 3 01/27/2019    Sig - Route: Take 1 capsule (180 mg total) by mouth daily. - Oral   Sent to pharmacy as: diltiazem (CARTIA XT) 180 MG 24 hr capsule   E-Prescribing Status: Receipt confirmed by pharmacy (01/27/2019  9:07 AM EST)   Pharmacy  [facility], [place]

## 2019-02-28 ENCOUNTER — Other Ambulatory Visit: Payer: Self-pay

## 2019-02-28 NOTE — Telephone Encounter (Signed)
Pt is requesting refill for XARELTO and is currently at her pharmacy. She recently switched insurance companies to [employer] and they are requiring a prior authorization in order to fill this prescription. Please address. Pt of Dr. Stanford Breed.

## 2019-03-03 MED ORDER — RIVAROXABAN 20 MG PO TABS: 20.0000 mg | ORAL_TABLET | Freq: Every day | ORAL | 3 refills | Status: DC

## 2019-03-03 NOTE — Telephone Encounter (Signed)
Routed to RN and CVRR concerning anticoagulant

## 2019-03-03 NOTE — Telephone Encounter (Signed)
Patient is following up regarding XARELTO medication. Patient states that she would like to ensure prior authorization was sent to [facility] in order to refill prescription.  Please call.

## 2019-03-03 NOTE — Telephone Encounter (Signed)
Follow up:     Patient calling back from Friday concering medication. Please call patient back.

## 2019-03-06 ENCOUNTER — Encounter: Payer: Self-pay | Admitting: *Deleted

## 2019-03-06 ENCOUNTER — Telehealth: Payer: Self-pay

## 2019-03-06 NOTE — Telephone Encounter (Signed)
**Note De-Identified Mayme Profeta Obfuscation** Fax received from [facility]: Xarelto 20 mg PA Key: Vertell Novak DOB: 17-Jul-1965

## 2019-03-10 ENCOUNTER — Telehealth: Payer: Self-pay | Admitting: Cardiology

## 2019-03-10 NOTE — Telephone Encounter (Signed)
See previous note-primary aware and working on [place].

## 2019-03-10 NOTE — Telephone Encounter (Signed)
Spoke with pt, paperwork for PA for xarelto faxed to [place].

## 2019-03-10 NOTE — Telephone Encounter (Signed)
Corene Cornea from [place] calling to get the prior authorization for the medication, rivaroxaban (XARELTO) 20 MG TABS tablet (30 day), faxed to [phone].

## 2019-03-11 NOTE — Telephone Encounter (Signed)
Left message for patient, received fax from [place] that xarelto was approved.

## 2019-04-18 ENCOUNTER — Emergency Department (HOSPITAL_BASED_OUTPATIENT_CLINIC_OR_DEPARTMENT_OTHER): Payer: Managed Care, Other (non HMO)

## 2019-04-18 ENCOUNTER — Other Ambulatory Visit: Payer: Self-pay

## 2019-04-18 ENCOUNTER — Encounter (HOSPITAL_BASED_OUTPATIENT_CLINIC_OR_DEPARTMENT_OTHER): Payer: Self-pay | Admitting: Emergency Medicine

## 2019-04-18 ENCOUNTER — Emergency Department (HOSPITAL_BASED_OUTPATIENT_CLINIC_OR_DEPARTMENT_OTHER)
Admission: EM | Admit: 2019-04-18 | Discharge: 2019-04-18 | Disposition: A | Discharge status: Home / Self Care | Payer: Managed Care, Other (non HMO) | Attending: Emergency Medicine | Admitting: Emergency Medicine

## 2019-04-18 DIAGNOSIS — Z888 Allergy status to other drugs, medicaments and biological substances status: Secondary | ICD-10-CM | POA: Insufficient documentation

## 2019-04-18 DIAGNOSIS — N181 Chronic kidney disease, stage 1: Secondary | ICD-10-CM | POA: Diagnosis not present

## 2019-04-18 DIAGNOSIS — R1011 Right upper quadrant pain: Secondary | ICD-10-CM | POA: Diagnosis present

## 2019-04-18 DIAGNOSIS — Z87891 Personal history of nicotine dependence: Secondary | ICD-10-CM | POA: Insufficient documentation

## 2019-04-18 DIAGNOSIS — Z79899 Other long term (current) drug therapy: Secondary | ICD-10-CM | POA: Diagnosis not present

## 2019-04-18 DIAGNOSIS — I480 Paroxysmal atrial fibrillation: Secondary | ICD-10-CM | POA: Insufficient documentation

## 2019-04-18 DIAGNOSIS — Z885 Allergy status to narcotic agent status: Secondary | ICD-10-CM | POA: Insufficient documentation

## 2019-04-18 DIAGNOSIS — I129 Hypertensive chronic kidney disease with stage 1 through stage 4 chronic kidney disease, or unspecified chronic kidney disease: Secondary | ICD-10-CM | POA: Diagnosis not present

## 2019-04-18 DIAGNOSIS — K5732 Diverticulitis of large intestine without perforation or abscess without bleeding: Secondary | ICD-10-CM | POA: Diagnosis not present

## 2019-04-18 DIAGNOSIS — K5792 Diverticulitis of intestine, part unspecified, without perforation or abscess without bleeding: Secondary | ICD-10-CM

## 2019-04-18 LAB — CBC WITH DIFFERENTIAL/PLATELET
Abs Immature Granulocytes: 0.01 10*3/uL (ref 0.00–0.07)
Basophils Absolute: 0 10*3/uL (ref 0.0–0.1)
Basophils Relative: 1 %
Eosinophils Absolute: 0.2 10*3/uL (ref 0.0–0.5)
Eosinophils Relative: 3 %
HCT: 42.6 % (ref 36.0–46.0)
Hemoglobin: 14.7 g/dL (ref 12.0–15.0)
Immature Granulocytes: 0 %
Lymphocytes Relative: 29 %
Lymphs Abs: 2 10*3/uL (ref 0.7–4.0)
MCH: 30.4 pg (ref 26.0–34.0)
MCHC: 34.5 g/dL (ref 30.0–36.0)
MCV: 88 fL (ref 80.0–100.0)
Monocytes Absolute: 0.7 10*3/uL (ref 0.1–1.0)
Monocytes Relative: 9 %
Neutro Abs: 4 10*3/uL (ref 1.7–7.7)
Neutrophils Relative %: 58 %
Platelets: 272 10*3/uL (ref 150–400)
RBC: 4.84 MIL/uL (ref 3.87–5.11)
RDW: 13.6 % (ref 11.5–15.5)
WBC: 6.9 10*3/uL (ref 4.0–10.5)
nRBC: 0 % (ref 0.0–0.2)

## 2019-04-18 LAB — COMPREHENSIVE METABOLIC PANEL
ALT: 15 U/L (ref 0–44)
AST: 13 U/L — ABNORMAL LOW (ref 15–41)
Albumin: 3.7 g/dL (ref 3.5–5.0)
Alkaline Phosphatase: 49 U/L (ref 38–126)
Anion gap: 8 (ref 5–15)
BUN: 14 mg/dL (ref 6–20)
CO2: 24 mmol/L (ref 22–32)
Calcium: 9 mg/dL (ref 8.9–10.3)
Chloride: 105 mmol/L (ref 98–111)
Creatinine, Ser: 0.66 mg/dL (ref 0.44–1.00)
GFR calc Af Amer: >60 mL/min (ref >60)
GFR calc non Af Amer: >60 mL/min (ref >60)
Glucose, Bld: 89 mg/dL (ref 70–99)
Potassium: 3.6 mmol/L (ref 3.5–5.1)
Sodium: 137 mmol/L (ref 135–145)
Total Bilirubin: 0.7 mg/dL (ref 0.3–1.2)
Total Protein: 7.1 g/dL (ref 6.5–8.1)

## 2019-04-18 LAB — URINALYSIS, ROUTINE W REFLEX MICROSCOPIC
Bilirubin Urine: NEGATIVE
Glucose, UA: NEGATIVE mg/dL
Hgb urine dipstick: NEGATIVE
Ketones, ur: NEGATIVE mg/dL
Leukocytes,Ua: NEGATIVE
Nitrite: NEGATIVE
Protein, ur: NEGATIVE mg/dL
Specific Gravity, Urine: 1.025 (ref 1.005–1.030)
pH: 5.5 (ref 5.0–8.0)

## 2019-04-18 LAB — LIPASE, BLOOD: Lipase: 23 U/L (ref 11–51)

## 2019-04-18 MED ORDER — IOHEXOL 300 MG/ML  SOLN
100.0000 mL | Freq: Once | INTRAMUSCULAR | Status: AC | PRN
  Administered 2019-04-18: 100 mL via INTRAVENOUS

## 2019-04-18 MED ORDER — METRONIDAZOLE 500 MG PO TABS: 500.0000 mg | ORAL_TABLET | Freq: Three times a day (TID) | ORAL | 0 refills | Status: DC

## 2019-04-18 MED ORDER — HYDROCODONE-ACETAMINOPHEN 5-325 MG PO TABS: 1.0000 | ORAL_TABLET | Freq: Four times a day (QID) | ORAL | 0 refills | Status: DC | PRN

## 2019-04-18 MED ORDER — SODIUM CHLORIDE 0.9 % IV BOLUS
1000.0000 mL | Freq: Once | INTRAVENOUS | Status: AC
  Administered 2019-04-18: 1000 mL via INTRAVENOUS

## 2019-04-18 MED ORDER — CIPROFLOXACIN HCL 500 MG PO TABS: 500.0000 mg | ORAL_TABLET | Freq: Two times a day (BID) | ORAL | 0 refills | Status: DC

## 2019-04-18 NOTE — Discharge Instructions (Addendum)
Begin taking Cipro and Flagyl as prescribed.  Hydrocodone as prescribed as needed for pain.  Follow-up with primary doctor if not improving in the next few days, and return to the ER if you develop worsening pain, high fevers, bloody stool, or other new and concerning symptoms.

## 2019-04-18 NOTE — ED Triage Notes (Signed)
Reports RUQ abdominal pain that radiates into the back for 2 weeks but worse today.  Also reports having constant nausea.  Hx of bowel resection due to diverticulitis.  Reports feeling full all the time.

## 2019-04-18 NOTE — ED Notes (Signed)
Pt ambulated to bathroom 

## 2019-04-18 NOTE — ED Provider Notes (Signed)
[facility] EMERGENCY DEPARTMENT Provider Note   CSN: [id number] Arrival date & time: 04/18/19  1534     History Chief Complaint  Patient presents with  . Abdominal Pain    Crystal Brennan is a 54 y.o. female.  Patient is a 54 year old female with past medical history of diverticulitis, paroxysmal A. fib, and anxiety.  She presents today for evaluation of abdominal pain.  She describes pain to the right upper quadrant that radiates to her right flank.  This has been present for the past 2 weeks, became much worse today.  She denies any bowel or bladder complaints.  She denies any fevers or chills.  The history is provided by the patient.  Abdominal Pain Pain location:  RUQ and R flank Pain quality: cramping   Pain radiates to:  R flank Pain severity:  Moderate Onset quality:  Gradual Duration:  2 weeks Timing:  Constant Progression:  Worsening Chronicity:  New Relieved by:  Nothing Worsened by:  Movement and palpation Ineffective treatments:  None tried      Past Medical History:  Diagnosis Date  . Anemia   . Anxiety   . Arthritis    shoulders   . Diastolic dysfunction    a. 12/2013 Echo: EF 65-70%, no rwma, Gr1 DD, PASP 77mmHg.  . Diverticulitis 2015  . Family history of adverse reaction to anesthesia    mother wakes up during surgery   . H/O cardiovascular stress test    a. 12/2013 MV: no ischemia/infarct, prominent breast attenuation.  . Hypertension   . PAF (paroxysmal atrial fibrillation) ([place])    a. 12/2013 PAF->TSH nl->converted with Dilt;  b. CHA2DS2VASc = 2-->Eliquis.  . Sleep apnea    cpap - does not know settings     Patient Active Problem List   Diagnosis Date Noted  . Diverticulitis of colon 06/29/2015  . Preop cardiovascular exam 05/11/2015  . Anemia of chronic disease 03/03/2015  . Thrombocytosis ([place]) 03/03/2015  . Benign essential HTN 03/03/2015  . Morbid obesity due to excess calories ([place]) 03/03/2015  . Pelvic abscess in female    . Hypokalemia 02/20/2015  . Chronic diastolic congestive heart failure, NYHA class 1 ([place]) 02/20/2015  . Diverticulitis of large intestine with perforation without bleeding   . OSA (obstructive sleep apnea) 04/08/2014  . PAF (paroxysmal atrial fibrillation) ([place]) 01/01/2014  . Anxiety     Past Surgical History:  Procedure Laterality Date  . [place]; 1986; 1988  . CHOLECYSTECTOMY  2009  . COLON RESECTION N/A 06/29/2015   Procedure:  LAPAROSCOPIC ASSISTED SIGMOID COLECTOMY WITH MOBILIZATION OF SPLENIC  FLEXURE;  Surgeon: Alphonsa Overall, MD;  Location: [facility];  Service: General;  Laterality: N/A;  . cyst removed from right ovary      age 28   . TUBAL LIGATION  89     OB History   No obstetric history on file.     Family History  Problem Relation Age of Onset  . Heart attack Mother        ? in her 87's - never sought treatment  . Pulmonary embolism Mother   . Other Father        ? hx  . Lupus Sister   . Heart failure Sister     Social History   Tobacco Use  . Smoking status: Former Smoker    Packs/day: 0.33    Years: 30.00    Pack years: 9.90    Types: Cigarettes  Quit date: 01/01/2015    Years since quitting: 4.2  . Smokeless tobacco: Never Used  Substance Use Topics  . Alcohol use: No    Alcohol/week: 0.0 standard drinks  . Drug use: No    Home Medications Prior to Admission medications   Medication Sig Start Date End Date Taking? Authorizing Provider  diltiazem (CARTIA XT) 180 MG 24 hr capsule Take 1 capsule (180 mg total) by mouth daily. 01/27/19   Lelon Perla, MD  LORazepam (ATIVAN) 0.5 MG tablet TAKE 1 2 (ONE HALF) TABLET BY MOUTH TWICE DAILY AS NEEDED 11/10/17   [provider]  meclizine (ANTIVERT) 25 MG tablet Take 1 tablet (25 mg total) by mouth 3 (three) times daily as needed for dizziness. 03/06/17   Sherwood Gambler, MD  metoprolol tartrate (LOPRESSOR) 50 MG tablet Take 1 tablet (50 mg total) by mouth 2 (two) times daily.  01/27/19   Lelon Perla, MD  naproxen sodium (ALEVE) 220 MG tablet Take 880 mg by mouth daily as needed (PAIN).    [provider]  traMADol (ULTRAM) 50 MG tablet SMARTSIG:1-2 Tablet(s) By Mouth 2-3 Times Daily PRN 12/31/18   [provider]    Allergies    Dilaudid [hydromorphone hcl], Benadryl [diphenhydramine hcl], Buspar [buspirone], Other, Codeine, and Percocet [oxycodone-acetaminophen]  Review of Systems   Review of Systems  Gastrointestinal: Positive for abdominal pain.  All other systems reviewed and are negative.   Physical Exam Updated Vital Signs BP (!) 159/84 (BP Location: Left Arm)   Pulse 66   Temp 97.8 F (36.6 C) (Oral)   Resp 18   Ht 5' (1.524 m)   Wt 121.1 kg   SpO2 99%   BMI 52.14 kg/m   Physical Exam Vitals and nursing note reviewed.  Constitutional:      General: She is not in acute distress.    Appearance: She is well-developed. She is not diaphoretic.  HENT:     Head: Normocephalic and atraumatic.  Cardiovascular:     Rate and Rhythm: Normal rate and regular rhythm.     Heart sounds: No murmur. No friction rub. No gallop.   Pulmonary:     Effort: Pulmonary effort is normal. No respiratory distress.     Breath sounds: Normal breath sounds. No wheezing.  Abdominal:     General: Bowel sounds are normal. There is no distension.     Palpations: Abdomen is soft.     Tenderness: There is abdominal tenderness in the right upper quadrant. There is right CVA tenderness. There is no left CVA tenderness, guarding or rebound.  Musculoskeletal:        General: Normal range of motion.     Cervical back: Normal range of motion and neck supple.  Skin:    General: Skin is warm and dry.  Neurological:     Mental Status: She is alert and oriented to person, place, and time.     ED Results / Procedures / Treatments   Labs (all labs ordered are listed, but only abnormal results are displayed) Labs Reviewed  COMPREHENSIVE METABOLIC  PANEL  LIPASE, BLOOD  CBC WITH DIFFERENTIAL/PLATELET  URINALYSIS, ROUTINE W REFLEX MICROSCOPIC    EKG None  Radiology No results found.  Procedures Procedures (including critical care time)  Medications Ordered in ED Medications  sodium chloride 0.9 % bolus 1,000 mL (has no administration in time range)    ED Course  I have reviewed the triage vital signs and the nursing notes.  Pertinent  labs & imaging results that were available during my care of the patient were reviewed by me and considered in my medical decision making (see chart for details).    MDM Rules/Calculators/A&P  Patient is a 54 year old female presenting with complaints of pain to the right upper quadrant radiating through to her back.  She is status post cholecystectomy several years ago.  Patient's work-up today shows no significant laboratory abnormalities and CT scan of the abdomen and pelvis is suspicious for diverticulitis.  Patient will be treated with Cipro and Flagyl and pain medication and is to follow-up as needed if not improving.  Final Clinical Impression(s) / ED Diagnoses Final diagnoses:  None    Rx / DC Orders ED Discharge Orders    None       Veryl Speak, MD 04/18/19 [phone]

## 2019-05-16 ENCOUNTER — Ambulatory Visit: Payer: Managed Care, Other (non HMO) | Attending: Internal Medicine

## 2019-05-16 DIAGNOSIS — Z23 Encounter for immunization: Secondary | ICD-10-CM

## 2019-05-16 NOTE — Progress Notes (Signed)
   Covid-19 Vaccination Clinic  Name:  Pocahontas Crus    MRN: [id number] DOB: 1965/08/24  05/16/2019  Ms. Holtzer was observed post Covid-19 immunization for 30 minutes based on pre-vaccination screening without incident. She was provided with Vaccine Information Sheet and instruction to access the V-Safe system.   Ms. Tikkanen was instructed to call 911 with any severe reactions post vaccine: Marland Kitchen Difficulty breathing  . Swelling of face and throat  . A fast heartbeat  . A bad rash all over body  . Dizziness and weakness   Immunizations Administered    Name Date Dose VIS Date Route   Pfizer COVID-19 Vaccine 05/16/2019  4:42 PM 0.3 mL 01/24/2019 Intramuscular   Manufacturer: [place]   Lot: [id number]   [place]: [id number]

## 2019-06-10 ENCOUNTER — Ambulatory Visit: Payer: Managed Care, Other (non HMO) | Attending: Internal Medicine

## 2019-06-10 DIAGNOSIS — Z23 Encounter for immunization: Secondary | ICD-10-CM

## 2019-06-10 NOTE — Progress Notes (Signed)
   Covid-19 Vaccination Clinic  Name:  Crystal Brennan    MRN: [id number] DOB: [birth year]  06/10/2019  Crystal Brennan was observed post Covid-19 immunization for 30 minutes based on pre-vaccination screening without incident. She was provided with Vaccine Information Sheet and instruction to access the V-Safe system. Patient became lightheaded at the end of the 30 minutes and refused to stay longer.  Crystal Brennan was instructed to call 911 with any severe reactions post vaccine: Marland Kitchen Difficulty breathing  . Swelling of face and throat  . A fast heartbeat  . A bad rash all over body  . Dizziness and weakness   Immunizations Administered    Name Date Dose VIS Date Route   Pfizer COVID-19 Vaccine 06/10/2019  4:25 PM 0.3 mL 04/09/2018 Intramuscular   Manufacturer: [place]   Lot: [id number]   [place]: [id number]

## 2019-12-27 ENCOUNTER — Other Ambulatory Visit: Payer: Self-pay

## 2019-12-27 ENCOUNTER — Ambulatory Visit: Payer: Managed Care, Other (non HMO) | Attending: Internal Medicine

## 2019-12-27 DIAGNOSIS — Z23 Encounter for immunization: Secondary | ICD-10-CM

## 2019-12-27 NOTE — Progress Notes (Signed)
   Covid-19 Vaccination Clinic  Name:  Crystal Brennan    MRN: [id number] DOB: 11/15/1965  12/27/2019  Crystal Brennan was observed post Covid-19 immunization for 15 minutes without incident. She was provided with Vaccine Information Sheet and instruction to access the V-Safe system.   Crystal Brennan was instructed to call 911 with any severe reactions post vaccine: Marland Kitchen Difficulty breathing  . Swelling of face and throat  . A fast heartbeat  . A bad rash all over body  . Dizziness and weakness   Immunizations Administered    Name Date Dose VIS Date Route   Pfizer COVID-19 Vaccine 12/27/2019  2:50 PM 0.3 mL 12/03/2019 Intramuscular   Manufacturer: [place]   Lot: [id number]   [place]: [id number]

## 2020-02-09 ENCOUNTER — Other Ambulatory Visit: Payer: Self-pay | Admitting: Cardiology

## 2020-03-08 ENCOUNTER — Encounter (HOSPITAL_BASED_OUTPATIENT_CLINIC_OR_DEPARTMENT_OTHER): Payer: Self-pay | Admitting: Emergency Medicine

## 2020-03-08 ENCOUNTER — Emergency Department (HOSPITAL_BASED_OUTPATIENT_CLINIC_OR_DEPARTMENT_OTHER)
Admission: EM | Admit: 2020-03-08 | Discharge: 2020-03-09 | Disposition: A | Discharge status: Home / Self Care | Payer: 59 | Attending: Emergency Medicine | Admitting: Emergency Medicine

## 2020-03-08 ENCOUNTER — Other Ambulatory Visit: Payer: Self-pay

## 2020-03-08 DIAGNOSIS — I5032 Chronic diastolic (congestive) heart failure: Secondary | ICD-10-CM | POA: Diagnosis not present

## 2020-03-08 DIAGNOSIS — Z79899 Other long term (current) drug therapy: Secondary | ICD-10-CM | POA: Insufficient documentation

## 2020-03-08 DIAGNOSIS — Z7901 Long term (current) use of anticoagulants: Secondary | ICD-10-CM | POA: Diagnosis not present

## 2020-03-08 DIAGNOSIS — I110 Hypertensive heart disease with heart failure: Secondary | ICD-10-CM | POA: Diagnosis not present

## 2020-03-08 DIAGNOSIS — I4891 Unspecified atrial fibrillation: Secondary | ICD-10-CM | POA: Diagnosis not present

## 2020-03-08 DIAGNOSIS — R1032 Left lower quadrant pain: Secondary | ICD-10-CM | POA: Insufficient documentation

## 2020-03-08 DIAGNOSIS — Z87891 Personal history of nicotine dependence: Secondary | ICD-10-CM | POA: Insufficient documentation

## 2020-03-08 DIAGNOSIS — K625 Hemorrhage of anus and rectum: Secondary | ICD-10-CM

## 2020-03-08 DIAGNOSIS — R109 Unspecified abdominal pain: Secondary | ICD-10-CM

## 2020-03-08 LAB — URINALYSIS, ROUTINE W REFLEX MICROSCOPIC
Bilirubin Urine: NEGATIVE
Glucose, UA: NEGATIVE mg/dL
Hgb urine dipstick: NEGATIVE
Ketones, ur: NEGATIVE mg/dL
Leukocytes,Ua: NEGATIVE
Nitrite: NEGATIVE
Protein, ur: NEGATIVE mg/dL
Specific Gravity, Urine: 1.02 (ref 1.005–1.030)
pH: 6 (ref 5.0–8.0)

## 2020-03-08 NOTE — ED Notes (Signed)
VP attempt x2 for labs.  Unsuccessful, RN aware.

## 2020-03-08 NOTE — ED Triage Notes (Signed)
Rectal bleeding for 3 weeks.  Pt having abdominal pain for 4 days.  Pt also having diarrhea.  Pt has been incontinent of blood and stool.  No known fever.  Some N/V.

## 2020-03-09 ENCOUNTER — Emergency Department (HOSPITAL_BASED_OUTPATIENT_CLINIC_OR_DEPARTMENT_OTHER): Payer: 59

## 2020-03-09 ENCOUNTER — Encounter (HOSPITAL_BASED_OUTPATIENT_CLINIC_OR_DEPARTMENT_OTHER): Payer: Self-pay | Admitting: Radiology

## 2020-03-09 LAB — COMPREHENSIVE METABOLIC PANEL
ALT: 18 U/L (ref 0–44)
AST: 19 U/L (ref 15–41)
Albumin: 3.8 g/dL (ref 3.5–5.0)
Alkaline Phosphatase: 48 U/L (ref 38–126)
Anion gap: 7 (ref 5–15)
BUN: 14 mg/dL (ref 6–20)
CO2: 24 mmol/L (ref 22–32)
Calcium: 8.9 mg/dL (ref 8.9–10.3)
Chloride: 106 mmol/L (ref 98–111)
Creatinine, Ser: 0.83 mg/dL (ref 0.44–1.00)
GFR, Estimated: >60 mL/min (ref >60)
Glucose, Bld: 129 mg/dL — ABNORMAL HIGH (ref 70–99)
Potassium: 3.6 mmol/L (ref 3.5–5.1)
Sodium: 137 mmol/L (ref 135–145)
Total Bilirubin: 0.2 mg/dL — ABNORMAL LOW (ref 0.3–1.2)
Total Protein: 7 g/dL (ref 6.5–8.1)

## 2020-03-09 LAB — CBC
HCT: 40.5 % (ref 36.0–46.0)
Hemoglobin: 13.6 g/dL (ref 12.0–15.0)
MCH: 28.7 pg (ref 26.0–34.0)
MCHC: 33.6 g/dL (ref 30.0–36.0)
MCV: 85.4 fL (ref 80.0–100.0)
Platelets: 258 10*3/uL (ref 150–400)
RBC: 4.74 MIL/uL (ref 3.87–5.11)
RDW: 13.6 % (ref 11.5–15.5)
WBC: 5.7 10*3/uL (ref 4.0–10.5)
nRBC: 0 % (ref 0.0–0.2)

## 2020-03-09 LAB — LIPASE, BLOOD: Lipase: 29 U/L (ref 11–51)

## 2020-03-09 MED ORDER — DICYCLOMINE HCL 10 MG PO CAPS
10.0000 mg | ORAL_CAPSULE | Freq: Once | ORAL | Status: AC
  Administered 2020-03-09: 10 mg via ORAL
  Filled 2020-03-09: qty 1

## 2020-03-09 MED ORDER — IOHEXOL 300 MG/ML  SOLN
100.0000 mL | Freq: Once | INTRAMUSCULAR | Status: AC | PRN
  Administered 2020-03-09: 100 mL via INTRAVENOUS

## 2020-03-09 MED ORDER — DICYCLOMINE HCL 20 MG PO TABS: 20.0000 mg | ORAL_TABLET | Freq: Four times a day (QID) | ORAL | 0 refills | Status: DC | PRN

## 2020-03-09 MED ORDER — ONDANSETRON 4 MG PO TBDP
8.0000 mg | ORAL_TABLET | Freq: Once | ORAL | Status: AC
  Administered 2020-03-09: 8 mg via ORAL
  Filled 2020-03-09: qty 2

## 2020-03-09 MED ORDER — ONDANSETRON 8 MG PO TBDP: ORAL_TABLET | ORAL | 0 refills | Status: AC

## 2020-03-09 MED ORDER — SODIUM CHLORIDE 0.9 % IV BOLUS
1000.0000 mL | Freq: Once | INTRAVENOUS | Status: AC
  Administered 2020-03-09: 1000 mL via INTRAVENOUS

## 2020-03-09 NOTE — ED Provider Notes (Signed)
[facility] EMERGENCY DEPARTMENT Provider Note   CSN: [id number] Arrival date & time: 03/08/20  1553     History Chief Complaint  Patient presents with  . Abdominal Pain    Crystal Brennan is a 55 y.o. female.  Patient is a 55 year old female with history of paroxysmal A. fib on Xarelto, hypertension, prior diverticulitis.  Patient presents today for evaluation of lower abdominal pain and bloody stool.  This is been ongoing since yesterday.  She denies fevers or chills.  Patient reports noticing blood in the toilet water and blood on the toilet paper when she wipes.  She also has history of rectal abscess, however her symptoms seem more consistent with diverticulitis than abscess.  The history is provided by the patient.  Abdominal Pain Pain location:  Suprapubic and LLQ Pain radiates to:  Does not radiate Pain severity:  Moderate Duration:  2 days Timing:  Constant Progression:  Worsening Relieved by:  Nothing Worsened by:  Palpation and movement      Past Medical History:  Diagnosis Date  . Anemia   . Anxiety   . Arthritis    shoulders   . Diastolic dysfunction    a. 12/2013 Echo: EF 65-70%, no rwma, Gr1 DD, PASP 6mmHg.  . Diverticulitis 2015  . Family history of adverse reaction to anesthesia    mother wakes up during surgery   . H/O cardiovascular stress test    a. 12/2013 MV: no ischemia/infarct, prominent breast attenuation.  . Hypertension   . PAF (paroxysmal atrial fibrillation) ([place])    a. 12/2013 PAF->TSH nl->converted with Dilt;  b. CHA2DS2VASc = 2-->Eliquis.  . Sleep apnea    cpap - does not know settings     Patient Active Problem List   Diagnosis Date Noted  . Diverticulitis of colon 06/29/2015  . Preop cardiovascular exam 05/11/2015  . Anemia of chronic disease 03/03/2015  . Thrombocytosis 03/03/2015  . Benign essential HTN 03/03/2015  . Morbid obesity due to excess calories ([place]) 03/03/2015  . Pelvic abscess in female   .  Hypokalemia 02/20/2015  . Chronic diastolic congestive heart failure, NYHA class 1 ([place]) 02/20/2015  . Diverticulitis of large intestine with perforation without bleeding   . OSA (obstructive sleep apnea) 04/08/2014  . PAF (paroxysmal atrial fibrillation) ([place]) 01/01/2014  . Anxiety     Past Surgical History:  Procedure Laterality Date  . [place]; 1986; 1988  . CHOLECYSTECTOMY  2009  . COLON RESECTION N/A 06/29/2015   Procedure:  LAPAROSCOPIC ASSISTED SIGMOID COLECTOMY WITH MOBILIZATION OF SPLENIC  FLEXURE;  Surgeon: Alphonsa Overall, MD;  Location: [facility];  Service: General;  Laterality: N/A;  . cyst removed from right ovary      age 89   . TUBAL LIGATION  89     OB History   No obstetric history on file.     Family History  Problem Relation Age of Onset  . Heart attack Mother        ? in her 27's - never sought treatment  . Pulmonary embolism Mother   . Other Father        ? hx  . Lupus Sister   . Heart failure Sister     Social History   Tobacco Use  . Smoking status: Former Smoker    Packs/day: 0.33    Years: 30.00    Pack years: 9.90    Types: Cigarettes    Quit date: 01/01/2015    Years since quitting:  5.1  . Smokeless tobacco: Never Used  Vaping Use  . Vaping Use: Never used  Substance Use Topics  . Alcohol use: No    Alcohol/week: 0.0 standard drinks  . Drug use: No    Home Medications Prior to Admission medications   Medication Sig Start Date End Date Taking? Authorizing Provider  diltiazem (CARDIZEM CD) 180 MG 24 hr capsule Take 1 capsule by mouth once daily 02/09/20   Lelon Perla, MD  metoprolol tartrate (LOPRESSOR) 50 MG tablet Take 1 tablet (50 mg total) by mouth 2 (two) times daily. 01/27/19   Lelon Perla, MD  XARELTO 20 MG TABS tablet SMARTSIG:1 Tablet(s) By Mouth Every Evening 01/16/20   [provider]    Allergies    Dilaudid [hydromorphone hcl], Benadryl [diphenhydramine hcl], Buspar [buspirone], Other,  Codeine, and Percocet [oxycodone-acetaminophen]  Review of Systems   Review of Systems  Gastrointestinal: Positive for abdominal pain.  All other systems reviewed and are negative.   Physical Exam Updated Vital Signs BP 139/85   Pulse 75   Temp 98.4 F (36.9 C) (Oral)   Resp 18   Ht 5\' 1"  (1.549 m)   Wt 132.5 kg   LMP 10/24/2017   SpO2 98%   BMI 55.17 kg/m   Physical Exam Vitals and nursing note reviewed.  Constitutional:      General: She is not in acute distress.    Appearance: She is well-developed and well-nourished. She is not diaphoretic.  HENT:     Head: Normocephalic and atraumatic.  Cardiovascular:     Rate and Rhythm: Normal rate and regular rhythm.     Heart sounds: No murmur heard. No friction rub. No gallop.   Pulmonary:     Effort: Pulmonary effort is normal. No respiratory distress.     Breath sounds: Normal breath sounds. No wheezing.  Abdominal:     General: Bowel sounds are normal. There is no distension.     Palpations: Abdomen is soft.     Tenderness: There is abdominal tenderness in the suprapubic area and left lower quadrant. There is no right CVA tenderness, left CVA tenderness, guarding or rebound.  Musculoskeletal:        General: Normal range of motion.     Cervical back: Normal range of motion and neck supple.  Skin:    General: Skin is warm and dry.  Neurological:     Mental Status: She is alert and oriented to person, place, and time.     ED Results / Procedures / Treatments   Labs (all labs ordered are listed, but only abnormal results are displayed) Labs Reviewed  CBC  URINALYSIS, ROUTINE W REFLEX MICROSCOPIC  LIPASE, BLOOD  COMPREHENSIVE METABOLIC PANEL    EKG None  Radiology No results found.  Procedures Procedures   Medications Ordered in ED Medications  sodium chloride 0.9 % bolus 1,000 mL (has no administration in time range)    ED Course  I have reviewed the triage vital signs and the nursing  notes.  Pertinent labs & imaging results that were available during my care of the patient were reviewed by me and considered in my medical decision making (see chart for details).    MDM Rules/Calculators/A&P  Patient presenting here with complaints of left lower quadrant and suprapubic abdominal pain.  She also reports episodes of bloody stool.  This began yesterday.  Patient's abdominal exam is benign and laboratory studies are reassuring.  Her hemoglobin is stable and she has had  no further bleeding here in the ER.  Exam of the rectum reveals no evidence for hemorrhoids or obvious fissures.  Vital signs are stable and she is afebrile.  Work-up reveals negative abdominal CT scan.  Patient advised of these results and will be discharged with GI follow-up and as needed return.  Final Clinical Impression(s) / ED Diagnoses Final diagnoses:  None    Rx / DC Orders ED Discharge Orders    None       Veryl Speak, MD 03/09/20 [phone]

## 2020-03-09 NOTE — Discharge Instructions (Signed)
Be sure to get plenty of fiber in your diet.  Follow-up with gastroenterology in the next week.  The contact information for Dr. Michail Sermon has been provided in this discharge summary for you to call and make these arrangements.  Return to the emergency department in the meantime if you develop worsening pain, high fever, worsening bleeding, or other new and concerning symptoms.

## 2020-03-09 NOTE — ED Notes (Signed)
Patient transported to CT 

## 2020-03-11 ENCOUNTER — Other Ambulatory Visit: Payer: Self-pay | Admitting: Gastroenterology

## 2020-03-15 ENCOUNTER — Telehealth: Payer: Self-pay | Admitting: *Deleted

## 2020-03-15 NOTE — Telephone Encounter (Signed)
Patient with diagnosis of afib on Xarelto for anticoagulation.    Procedure: colonoscopy Date of procedure: 04/15/20  CHA2DS2-VASc Score = 3  This indicates a 3.2% annual risk of stroke. The patient's score is based upon: CHF History: Yes HTN History: Yes Diabetes History: No Stroke History: No Vascular Disease History: No Age Score: 0 Gender Score: 1  CrCl 48mL/min Platelet count 258K  Per office protocol, patient can hold Xarelto for 2 days prior to procedure.

## 2020-03-15 NOTE — Telephone Encounter (Signed)
Pharmacy please comment on anticoagulation and then I will contact the patient for preop clearance.  Kerin Ransom PA-C 03/15/2020 10:34 AM

## 2020-03-15 NOTE — Telephone Encounter (Signed)
   [facility] Pre-operative Risk Assessment    HEARTCARE STAFF: - Please ensure there is not already an duplicate clearance open for this procedure. - Under Visit Info/Reason for Call, type in Other and utilize the format Clearance MM/DD/YY or Clearance TBD. Do not use dashes or single digits. - If request is for dental extraction, please clarify the # of teeth to be extracted.  Request for surgical clearance:  1. What type of surgery is being performed? colonoscopy   2. When is this surgery scheduled? 04-15-20   3. What type of clearance is required (medical clearance vs. Pharmacy clearance to hold med vs. Both)? both  4. Are there any medications that need to be held prior to surgery and how long?xarelto-need direction   5. Practice name and name of physician performing surgery? [facility]   6. What is the office phone number? [phone]   7.   What is the office fax number? [phone]  8.   Anesthesia type (None, local, MAC, general) ? Not listed   Fredia Beets 03/15/2020, 8:41 AM  _________________________________________________________________   (provider comments below)

## 2020-03-16 NOTE — Telephone Encounter (Signed)
Appt schedule with Coletta Memos 02/17 @ 3:34 pm, pt made aware of appt and time

## 2020-03-16 NOTE — Telephone Encounter (Signed)
   Primary Cardiologist: Kirk Ruths, MD  Chart reviewed as part of pre-operative protocol coverage. Because of Crystal Brennan's past medical history and time since last visit, she will require a follow-up visit in order to better assess preoperative cardiovascular risk.  Pre-op covering staff: - Please schedule appointment and call patient to inform them. If patient already had an upcoming appointment within acceptable timeframe, please add "pre-op clearance" to the appointment notes so provider is aware. - Please contact requesting surgeon's office via preferred method (i.e, phone, fax) to inform them of need for appointment prior to surgery.  If applicable, this message will also be routed to pharmacy pool and/or primary cardiologist for input on holding anticoagulant/antiplatelet agent as requested below so that this information is available to the clearing provider at time of patient's appointment.   Kerin Ransom, PA-C  03/16/2020, 9:08 AM

## 2020-04-01 ENCOUNTER — Ambulatory Visit: Payer: 59 | Admitting: General Practice

## 2020-04-01 NOTE — Progress Notes (Signed)
Cardiology Clinic Note   Patient Name: Crystal Brennan Date of Encounter: 04/02/2020  Primary Care Provider:  Marda Stalker, PA-C Primary Cardiologist:  Crystal Ruths, MD  Patient Profile    Crystal Brennan 55 year old female presents the clinic today for follow-up evaluation of her paroxysmal atrial fibrillation and preoperative cardiac evaluation.  Past Medical History    Past Medical History:  Diagnosis Date  . Anemia   . Anxiety   . Arthritis    shoulders   . Diastolic dysfunction    a. 12/2013 Echo: EF 65-70%, no rwma, Gr1 DD, PASP 81mmHg.  . Diverticulitis 2015  . Family history of adverse reaction to anesthesia    mother wakes up during surgery   . H/O cardiovascular stress test    a. 12/2013 MV: no ischemia/infarct, prominent breast attenuation.  . Hypertension   . PAF (paroxysmal atrial fibrillation) ([place])    a. 12/2013 PAF->TSH nl->converted with Dilt;  b. CHA2DS2VASc = 2-->Eliquis.  . Sleep apnea    cpap - does not know settings    Past Surgical History:  Procedure Laterality Date  . [place]; 1986; 1988  . CHOLECYSTECTOMY  2009  . COLON RESECTION N/A 06/29/2015   Procedure:  LAPAROSCOPIC ASSISTED SIGMOID COLECTOMY WITH MOBILIZATION OF SPLENIC  FLEXURE;  Surgeon: Alphonsa Overall, MD;  Location: [facility];  Service: General;  Laterality: N/A;  . cyst removed from right ovary      age 42   . TUBAL LIGATION  198    Allergies  Allergies  Allergen Reactions  . Dilaudid [Hydromorphone Hcl] Other (See Comments)    "feels like she dying '  . Benadryl [Diphenhydramine Hcl] Hives and Itching  . Buspar [Buspirone] Other (See Comments)    Vertigo   . Other Other (See Comments)    No blood products for religious reasons  . Codeine Nausea And Vomiting  . Percocet [Oxycodone-Acetaminophen] Hives and Itching    History of Present Illness    Crystal Brennan has past medical history of atrial fibrillation; with an admission on 11/15 due to new onset A. fib.   Her TSH was normal at the time.  A Lexiscan was completed and was negative for ischemia, her EF 78%.  An echocardiogram showed an LVEF 65 to 70% with normal wall motion, mild concentric hypertrophy, grade 1 diastolic dysfunction, and peak PA pressures of 40 mmHg.  She has been successfully converted using Cardizem in the past.  She was seen in the ED on 12/2017 with atypical chest pain.  Her chest x-ray was negative.  Her troponins were normal.  Her hemoglobin was 8.9 and MCV 68.9.  Her symptoms improved with administration of Ativan.  She was  seen by Dr. Stanford Breed on 12/11/2017.  During that time she felt well and denied exertional chest pain, dyspnea on exertion, and syncope.  She did note having occasional brief palpitations and suffered from heavy menstrual cycles.  She presented the clinic 01/06/2019 and stated she had been doing well.  She noticed that occasionally in the morning she had a fast heart rate.    She reported her heart rate would normalize after taking her morning medications. She stated she had lost around 12 pounds since October and is following a low-sodium diet.  She was exercising regularly  and had a 30-minute exercise routine that she did on most days through the week.  She stated that a few weeks ago she experienced severe left shoulder pain, went to urgent care and was  sent to orthopedics.  The orthopedic office diagnosed her with a rotator cuff tear and prescribed a cortisone injection along with physical therapy.  I checked her BMP  and planned her follow-up in 1 year.  She presents to the clinic today for follow-up evaluation states she feels well. She fell in January and hurt her left leg. She is slowly increasing her physical activity and plans to resume her walking of 1 mile per day. She reports that she was around 301 pounds in January and is now down to 286. She is following a very strict low-sodium diet and restricting her carbohydrates. She reports that she is eating  about 1200 cal/day. Her shoulder pain has improved. She presents to the clinic today with her infant grandson who she watches regularly. I will give her a blood pressure log, have her continue to increase her physical activity as tolerated, and follow-up in one 1 year. She is cleared for her upcoming colonoscopy, she reports some rectal bleeding that needs to be evaluated.  She denies chest pain, shortness of breath, lower extremity edema, fatigue,  melena, hematuria, hemoptysis, diaphoresis, weakness, presyncope, syncope, orthopnea, and PND.  Home Medications    Prior to Admission medications   Medication Sig Start Date End Date Taking? Authorizing Provider  dicyclomine (BENTYL) 20 MG tablet Take 1 tablet (20 mg total) by mouth every 6 (six) hours as needed for spasms. 03/09/20   Veryl Speak, MD  diltiazem (CARDIZEM CD) 180 MG 24 hr capsule Take 1 capsule by mouth once daily 02/09/20   Lelon Perla, MD  metoprolol tartrate (LOPRESSOR) 50 MG tablet Take 1 tablet (50 mg total) by mouth 2 (two) times daily. 01/27/19   Lelon Perla, MD  ondansetron (ZOFRAN ODT) 8 MG disintegrating tablet 8mg  ODT q4 hours prn nausea 03/09/20   Veryl Speak, MD  XARELTO 20 MG TABS tablet SMARTSIG:1 Tablet(s) By Mouth Every Evening 01/16/20   [provider]    Family History    Family History  Problem Relation Age of Onset  . Heart attack Mother        ? in her 67's - never sought treatment  . Pulmonary embolism Mother   . Other Father        ? hx  . Lupus Sister   . Heart failure Sister    She indicated that the status of her mother is unknown. She indicated that the status of her father is unknown.  Social History    Social History   Socioeconomic History  . Marital status: Married    Spouse name: Not on file  . Number of children: Not on file  . Years of education: Not on file  . Highest education level: Not on file  Occupational History  . Not on file  Tobacco Use  .  Smoking status: Former Smoker    Packs/day: 0.33    Years: 30.00    Pack years: 9.90    Types: Cigarettes    Quit date: 01/01/2015    Years since quitting: 5.2  . Smokeless tobacco: Never Used  Vaping Use  . Vaping Use: Never used  Substance and Sexual Activity  . Alcohol use: No    Alcohol/week: 0.0 standard drinks  . Drug use: No  . Sexual activity: Not on file  Other Topics Concern  . Not on file  Social History Narrative   Lives in [place] with fiance.  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  Walks regularly.  Trying to lose weight.   Social Determinants of Health   Financial Resource Strain: Not on file  Food Insecurity: Not on file  Transportation Needs: Not on file  Physical Activity: Not on file  Stress: Not on file  Social Connections: Not on file  Intimate Partner Violence: Not on file     Review of Systems    General:  No chills, fever, night sweats or weight changes.  Cardiovascular:  No chest pain, dyspnea on exertion, edema, orthopnea, palpitations, paroxysmal nocturnal dyspnea. Dermatological: No rash, lesions/masses Respiratory: No cough, dyspnea Urologic: No hematuria, dysuria Abdominal:   No nausea, vomiting, diarrhea, bright red blood per rectum, melena, or hematemesis Neurologic:  No visual changes, wkns, changes in mental status. All other systems reviewed and are otherwise negative except as noted above.  Physical Exam    VS:  BP (!) 144/78   Ht 5\' 1"  (1.549 m)   Wt 284 lb (128.8 kg)   LMP 10/24/2017   BMI 53.66 kg/m  , BMI Body mass index is 53.66 kg/m. GEN: Well nourished, well developed, in no acute distress. HEENT: normal. Neck: Supple, no JVD, carotid bruits, or masses. Cardiac: RRR, no murmurs, rubs, or gallops. No clubbing, cyanosis, edema.  Radials/DP/PT 2+ and equal bilaterally.  Respiratory:  Respirations regular and unlabored, clear to auscultation bilaterally. GI: Soft, nontender, nondistended, BS + x 4. MS: no  deformity or atrophy. Skin: warm and dry, no rash. Neuro:  Strength and sensation are intact. Psych: Normal affect.  Accessory Clinical Findings    Recent Labs: 03/09/2020: ALT 18; BUN 14; Creatinine, Ser 0.83; Hemoglobin 13.6; Platelets 258; Potassium 3.6; Sodium 137   Recent Lipid Panel    Component Value Date/Time   CHOL 168 01/02/2014 0159   TRIG 158 (H) 03/02/2015 0455   HDL 43 01/02/2014 0159   CHOLHDL 3.9 01/02/2014 0159   VLDL 32 01/02/2014 0159   LDLCALC 93 01/02/2014 0159    ECG personally reviewed by me today-normal sinus rhythm 68 bpm no ST or T wave deviation-   EKG 01/06/2019 sinus bradycardia with sinus arrhythmia 56 bpm- No acute changes  EKG 11/15/2017 Normal sinus rhythm 72 bpm  Echocardiogram 01/02/2014 Study Conclusions   - Left ventricle: The cavity size was normal. There was mild  concentric hypertrophy. Systolic function was vigorous. The  estimated ejection fraction was in the range of 65% to 70%. Wall  motion was normal; there were no regional wall motion  abnormalities. Doppler parameters are consistent with abnormal  left ventricular relaxation (grade 1 diastolic dysfunction).  - Pulmonary arteries: Systolic pressure was mildly increased. PA  peak pressure: 40 mm Hg (S).  Assessment & Plan   1.  Paroxysmal atrial fibrillation-EKG today shows  normal sinus rhythm 68 bpm.  Denies bleeding issues.  Denies recent episodes of increased heart rate or irregular beats. Continue metoprolol,  Cardize, Xarelto Avoid triggers caffeine, alcohol, stimulants, dehydration etc.  Essential hypertension-BP today 144/78.   Presents and states she has increased stress. Also, presents with her grandson today. Continue metoprolol  Continue heart healthy low-sodium diet-salty 6 given Maintain physical activity-goal 150 minutes of moderate physical activity per week Maintain blood pressure log  Atypical chest pain-resolved.  Denies recent episodes  of chest pain.   Continue to monitor  Obesity-BMI 48.5.  Weight [id number] down from 301 Jan 2022. Continue increase physical activity Continue weight loss Continue heart healthy low-sodium diet-salty 6 given  Preoperative cardiac evaluation-colonoscopy, 04/15/2020 Howie Ill, fax # [phone]  Primary Cardiologist:  Crystal Ruths, MD  Chart reviewed as part of pre-operative protocol coverage. Given past medical history and time since last visit, based on ACC/AHA guidelines, Prisma Decarlo would be at acceptable risk for the planned procedure without further cardiovascular testing.   Her RCRI is a class I risk, 0.4% risk of major cardiac event.  She was able to complete greater than 4 METS of physical activity.  Patient with diagnosis of afib on Xarelto for anticoagulation.    Procedure: colonoscopy Date of procedure: 04/15/20  CHA2DS2-VASc Score = 3  This indicates a 3.2% annual risk of stroke. The patient's score is based upon: CHF History: Yes HTN History: Yes Diabetes History: No Stroke History: No Vascular Disease History: No Age Score: 0 Gender Score: 1  CrCl 63mL/min Platelet count 258K  Per office protocol, patient can hold Xarelto for 2 days prior to procedure.   Patient was advised that if she develops new symptoms prior to surgery to contact our office to arrange a follow-up appointment.  He verbalized understanding.  I will route this recommendation to the requesting party via Epic fax function and remove from pre-op pool.  Please call with questions.   Disposition: Follow-up with Dr. Stanford Breed in 1 year.   Jossie Ng. Danitza Schoenfeldt NP-C    04/02/2020, 10:49 AM [place] [address] Office [phone] Fax [phone]  Notice: This dictation was prepared with Dragon dictation along with smaller phrase technology. Any transcriptional errors that result from this process are unintentional and may not be corrected upon  review.  I spent 10 minutes examining this patient, reviewing medications, and using patient centered shared decision making involving her cardiac care.  Prior to her visit I spent greater than 20 minutes reviewing her past medical history,  medications, and prior cardiac tests.

## 2020-04-02 ENCOUNTER — Other Ambulatory Visit: Payer: Self-pay

## 2020-04-02 ENCOUNTER — Ambulatory Visit: Payer: 59 | Admitting: General Practice

## 2020-04-02 ENCOUNTER — Encounter: Payer: Self-pay | Admitting: General Practice

## 2020-04-02 VITALS — BP 144/78 | Ht 61.0 in | Wt 284.0 lb

## 2020-04-02 DIAGNOSIS — Z01810 Encounter for preprocedural cardiovascular examination: Secondary | ICD-10-CM

## 2020-04-02 DIAGNOSIS — I480 Paroxysmal atrial fibrillation: Secondary | ICD-10-CM | POA: Diagnosis not present

## 2020-04-02 DIAGNOSIS — R0789 Other chest pain: Secondary | ICD-10-CM

## 2020-04-02 DIAGNOSIS — I10 Essential (primary) hypertension: Secondary | ICD-10-CM | POA: Diagnosis not present

## 2020-04-02 MED ORDER — METOPROLOL TARTRATE 50 MG PO TABS: 50.0000 mg | ORAL_TABLET | Freq: Two times a day (BID) | ORAL | 3 refills | Status: DC

## 2020-04-02 MED ORDER — XARELTO 20 MG PO TABS: ORAL_TABLET | ORAL | 3 refills | Status: DC

## 2020-04-02 MED ORDER — DILTIAZEM HCL ER COATED BEADS 180 MG PO CP24: 180.0000 mg | ORAL_CAPSULE | Freq: Every day | ORAL | 3 refills | Status: DC

## 2020-04-02 NOTE — Patient Instructions (Signed)
Medication Instructions:  The current medical regimen is effective;  continue present plan and medications as directed. Please refer to the Current Medication list given to you today.  *If you need a refill on your cardiac medications before your next appointment, please call your pharmacy*  Lab Work:   Testing/Procedures:  NONE    NONE  Special Instructions TAKE AND LOG YOUR BLOOD PRESSURE COUPLE TIMES A WEEK  PLEASE READ AND FOLLOW SALTY 6-ATTACHED-1,800mg  daily  PLEASE INCREASE PHYSICAL ACTIVITY AS TOLERATED  Follow-Up: Your next appointment:  12 month(s) In Person with Kirk Ruths, MD OR IF UNAVAILABLE JESSE CLEAVER, FNP-C   Please call our office 2 months in advance to schedule this appointment   At [facility], you and your health needs are our priority.  As part of our continuing mission to provide you with exceptional heart care, we have created designated Provider Care Teams.  These Care Teams include your primary Cardiologist (physician) and Advanced Practice Providers (APPs -  Physician Assistants and Nurse Practitioners) who all work together to provide you with the care you need, when you need it.            6 SALTY THINGS TO AVOID     1,800MG  DAILY

## 2020-04-05 ENCOUNTER — Other Ambulatory Visit: Payer: Self-pay | Admitting: Gastroenterology

## 2020-04-08 NOTE — Progress Notes (Signed)
Attempted to obtain medical history via telephone, unable to reach at this time. I left a voicemail to return pre surgical testing department's phone call.  

## 2020-04-13 ENCOUNTER — Other Ambulatory Visit (HOSPITAL_COMMUNITY)
Admission: RE | Admit: 2020-04-13 | Discharge: 2020-04-13 | Disposition: A | Discharge status: Home / Self Care | Payer: 59 | Source: Ambulatory Visit | Attending: Gastroenterology | Admitting: Gastroenterology

## 2020-04-13 DIAGNOSIS — Z20822 Contact with and (suspected) exposure to covid-19: Secondary | ICD-10-CM | POA: Insufficient documentation

## 2020-04-13 DIAGNOSIS — Z01812 Encounter for preprocedural laboratory examination: Secondary | ICD-10-CM | POA: Insufficient documentation

## 2020-04-13 LAB — SARS CORONAVIRUS 2 (TAT 6-24 HRS): SARS Coronavirus 2: NEGATIVE

## 2020-04-15 ENCOUNTER — Encounter (HOSPITAL_COMMUNITY)
Admission: RE | Disposition: A | Discharge status: Home / Self Care | Payer: Self-pay | Source: Home / Self Care | Attending: Gastroenterology

## 2020-04-15 ENCOUNTER — Encounter (HOSPITAL_COMMUNITY): Payer: Self-pay | Admitting: Gastroenterology

## 2020-04-15 ENCOUNTER — Other Ambulatory Visit: Payer: Self-pay

## 2020-04-15 ENCOUNTER — Ambulatory Visit (HOSPITAL_COMMUNITY): Payer: 59 | Admitting: Anesthesiology

## 2020-04-15 ENCOUNTER — Ambulatory Visit (HOSPITAL_COMMUNITY)
Admission: RE | Admit: 2020-04-15 | Discharge: 2020-04-15 | Disposition: A | Discharge status: Home / Self Care | Payer: 59 | Attending: Gastroenterology | Admitting: Gastroenterology

## 2020-04-15 DIAGNOSIS — K621 Rectal polyp: Secondary | ICD-10-CM | POA: Insufficient documentation

## 2020-04-15 DIAGNOSIS — K625 Hemorrhage of anus and rectum: Secondary | ICD-10-CM | POA: Insufficient documentation

## 2020-04-15 DIAGNOSIS — K5730 Diverticulosis of large intestine without perforation or abscess without bleeding: Secondary | ICD-10-CM | POA: Insufficient documentation

## 2020-04-15 DIAGNOSIS — K640 First degree hemorrhoids: Secondary | ICD-10-CM | POA: Insufficient documentation

## 2020-04-15 HISTORY — PX: COLONOSCOPY WITH PROPOFOL: SHX5780

## 2020-04-15 HISTORY — PX: BIOPSY: SHX5522

## 2020-04-15 SURGERY — COLONOSCOPY WITH PROPOFOL
Anesthesia: Monitor Anesthesia Care

## 2020-04-15 MED ORDER — SODIUM CHLORIDE 0.9 % IV SOLN: INTRAVENOUS | Status: DC

## 2020-04-15 MED ORDER — LACTATED RINGERS IV SOLN: INTRAVENOUS | Status: DC

## 2020-04-15 MED ORDER — PROPOFOL 500 MG/50ML IV EMUL
INTRAVENOUS | Status: DC | PRN
  Administered 2020-04-15: 150 ug/kg/min via INTRAVENOUS

## 2020-04-15 SURGICAL SUPPLY — 22 items

## 2020-04-15 NOTE — Transfer of Care (Signed)
Immediate Anesthesia Transfer of Care Note  Patient: Crystal Brennan  Procedure(s) Performed: COLONOSCOPY WITH PROPOFOL (N/A ) BIOPSY  Patient Location: PACU  Anesthesia Type:MAC  Level of Consciousness: sedated, patient cooperative and responds to stimulation  Airway & Oxygen Therapy: Patient Spontanous Breathing and Patient connected to face mask oxygen  Post-op Assessment: Report given to RN and Post -op Vital signs reviewed and stable  Post vital signs: Reviewed and stable  Last Vitals:  Vitals Value Taken Time  BP 90/57 04/15/20 1331  Temp 36.6 C 04/15/20 1331  Pulse 79 04/15/20 1332  Resp 26 04/15/20 1332  SpO2 98 % 04/15/20 1332  Vitals shown include unvalidated device data.  Last Pain:  Vitals:   04/15/20 1331  TempSrc: Axillary  PainSc:          Complications: No complications documented.

## 2020-04-15 NOTE — Anesthesia Postprocedure Evaluation (Signed)
Anesthesia Post Note  Patient: Crystal Brennan  Procedure(s) Performed: COLONOSCOPY WITH PROPOFOL (N/A ) BIOPSY     Patient location during evaluation: PACU Anesthesia Type: MAC Level of consciousness: awake and alert Pain management: pain level controlled Vital Signs Assessment: post-procedure vital signs reviewed and stable Respiratory status: spontaneous breathing, nonlabored ventilation and respiratory function stable Cardiovascular status: blood pressure returned to baseline and stable Postop Assessment: no apparent nausea or vomiting Anesthetic complications: no   No complications documented.  Last Vitals:  Vitals:   04/15/20 1341 04/15/20 1350  BP: 129/79 (!) 125/47  Pulse: 84 66  Resp: 16 16  Temp:    SpO2: 96% 99%    Last Pain:  Vitals:   04/15/20 1350  TempSrc:   PainSc: 0-No pain                 Pervis Hocking

## 2020-04-15 NOTE — Discharge Instructions (Addendum)
**Continue to HOLD Xarelto until tomorrow and then resume taking it.**  YOU HAD AN ENDOSCOPIC PROCEDURE TODAY: Refer to the procedure report and other information in the discharge instructions given to you for any specific questions about what was found during the examination. If this information does not answer your questions, please call [facility] office at [phone] to clarify.   YOU SHOULD EXPECT: Some feelings of bloating in the abdomen. Passage of more gas than usual. Walking can help get rid of the air that was put into your GI tract during the procedure and reduce the bloating. If you had a lower endoscopy (such as a colonoscopy or flexible sigmoidoscopy) you may notice spotting of blood in your stool or on the toilet paper. Some abdominal soreness may be present for a day or two, also.  DIET: Your first meal following the procedure should be a light meal and then it is ok to progress to your normal diet. A half-sandwich or bowl of soup is an example of a good first meal. Heavy or fried foods are harder to digest and may make you feel nauseous or bloated. Drink plenty of fluids but you should avoid alcoholic beverages for 24 hours. If you had a esophageal dilation, please see attached instructions for diet.    ACTIVITY: Your care partner should take you home directly after the procedure. You should plan to take it easy, moving slowly for the rest of the day. You can resume normal activity the day after the procedure however YOU SHOULD NOT DRIVE, use power tools, machinery or perform tasks that involve climbing or major physical exertion for 24 hours (because of the sedation medicines used during the test).   SYMPTOMS TO REPORT IMMEDIATELY: A gastroenterologist can be reached at any hour. Please call [phone]  for any of the following symptoms:  . Following lower endoscopy (colonoscopy, flexible sigmoidoscopy) Excessive amounts of blood in the stool  Significant tenderness, worsening of  abdominal pains  Swelling of the abdomen that is new, acute  Fever of 100 or higher  . Following upper endoscopy (EGD, EUS, ERCP, esophageal dilation) Vomiting of blood or coffee ground material  New, significant abdominal pain  New, significant chest pain or pain under the shoulder blades  Painful or persistently difficult swallowing  New shortness of breath  Black, tarry-looking or red, bloody stools  FOLLOW UP:  If any biopsies were taken you will be contacted by phone or by letter within the next 1-3 weeks. Call [phone]  if you have not heard about the biopsies in 3 weeks.  Please also call with any specific questions about appointments or follow up tests. YOU HAD AN ENDOSCOPIC PROCEDURE TODAY: Refer to the procedure report and other information in the discharge instructions given to you for any specific questions about what was found during the examination. If this information does not answer your questions, please call [facility] office at [phone] to clarify.   YOU SHOULD EXPECT: Some feelings of bloating in the abdomen. Passage of more gas than usual. Walking can help get rid of the air that was put into your GI tract during the procedure and reduce the bloating. If you had a lower endoscopy (such as a colonoscopy or flexible sigmoidoscopy) you may notice spotting of blood in your stool or on the toilet paper. Some abdominal soreness may be present for a day or two, also.  DIET: Your first meal following the procedure should be a light meal and then it is ok  to progress to your normal diet. A half-sandwich or bowl of soup is an example of a good first meal. Heavy or fried foods are harder to digest and may make you feel nauseous or bloated. Drink plenty of fluids but you should avoid alcoholic beverages for 24 hours. If you had a esophageal dilation, please see attached instructions for diet.    ACTIVITY: Your care partner should take you home directly after the procedure. You  should plan to take it easy, moving slowly for the rest of the day. You can resume normal activity the day after the procedure however YOU SHOULD NOT DRIVE, use power tools, machinery or perform tasks that involve climbing or major physical exertion for 24 hours (because of the sedation medicines used during the test).   SYMPTOMS TO REPORT IMMEDIATELY: A gastroenterologist can be reached at any hour. Please call [phone]  for any of the following symptoms:  . Following lower endoscopy (colonoscopy, flexible sigmoidoscopy) Excessive amounts of blood in the stool  Significant tenderness, worsening of abdominal pains  Swelling of the abdomen that is new, acute  Fever of 100 or higher  . Following upper endoscopy (EGD, EUS, ERCP, esophageal dilation) Vomiting of blood or coffee ground material  New, significant abdominal pain  New, significant chest pain or pain under the shoulder blades  Painful or persistently difficult swallowing  New shortness of breath  Black, tarry-looking or red, bloody stools  FOLLOW UP:  If any biopsies were taken you will be contacted by phone or by letter within the next 1-3 weeks. Call [phone]  if you have not heard about the biopsies in 3 weeks.  Please also call with any specific questions about appointments or follow up tests.

## 2020-04-15 NOTE — Anesthesia Preprocedure Evaluation (Addendum)
Anesthesia Evaluation  Patient identified by MRN, date of birth, ID band Patient awake    Reviewed: Allergy & Precautions, NPO status , Patient's Chart, lab work & pertinent test results, reviewed documented beta blocker date and time   Airway Mallampati: III  TM Distance: >3 FB Neck ROM: Full    Dental  (+) Teeth Intact, Dental Advisory Given   Pulmonary sleep apnea (noncompliant w/ CPAP) , former smoker,  Quit smoking 2016, 10 pack year history    Pulmonary exam normal breath sounds clear to auscultation       Cardiovascular hypertension, Pt. on home beta blockers and Pt. on medications +CHF (grade 1 diastolic dysfunction)  Normal cardiovascular exam+ dysrhythmias (xarelto- last dose 2/28) Atrial Fibrillation  Rhythm:Regular Rate:Normal  Echo 2015: - Left ventricle: The cavity size was normal. There was mild  concentric hypertrophy. Systolic function was vigorous. The  estimated ejection fraction was in the range of 65% to 70%. Wall  motion was normal; there were no regional wall motion  abnormalities. Doppler parameters are consistent with abnormal  left ventricular relaxation (grade 1 diastolic dysfunction).  - Pulmonary arteries: Systolic pressure was mildly increased. PA  peak pressure: 40 mm Hg (S).     Neuro/Psych PSYCHIATRIC DISORDERS Anxiety negative neurological ROS     GI/Hepatic Neg liver ROS, Rectal bleeding   Endo/Other  Morbid obesityBMI 54  Renal/GU negative Renal ROS  negative genitourinary   Musculoskeletal  (+) Arthritis , Osteoarthritis,    Abdominal   Peds  Hematology  (+) Blood dyscrasia, Sickle cell trait , REFUSES BLOOD PRODUCTS,   Anesthesia Other Findings   Reproductive/Obstetrics negative OB ROS                           Anesthesia Physical Anesthesia Plan  ASA: III  Anesthesia Plan: MAC   Post-op Pain Management:    Induction:   PONV  Risk Score and Plan: 2 and Propofol infusion and TIVA  Airway Management Planned: Natural Airway and Simple Face Mask  Additional Equipment: None  Intra-op Plan:   Post-operative Plan:   Informed Consent: I have reviewed the patients History and Physical, chart, labs and discussed the procedure including the risks, benefits and alternatives for the proposed anesthesia with the patient or authorized representative who has indicated his/her understanding and acceptance.       Plan Discussed with: CRNA  Anesthesia Plan Comments:        Anesthesia Quick Evaluation

## 2020-04-15 NOTE — H&P (Signed)
Date of Initial H&P: 04/05/20  History reviewed, patient examined, no change in status, stable for surgery.

## 2020-04-15 NOTE — Interval H&P Note (Signed)
History and Physical Interval Note:  04/15/2020 12:57 PM  Crystal Brennan  has presented today for surgery, with the diagnosis of Rectal bleeding.  The various methods of treatment have been discussed with the patient and family. After consideration of risks, benefits and other options for treatment, the patient has consented to  Procedure(s): COLONOSCOPY WITH PROPOFOL (N/A) as a surgical intervention.  The patient's history has been reviewed, patient examined, no change in status, stable for surgery.  I have reviewed the patient's chart and labs.  Questions were answered to the patient's satisfaction.     Lear Ng

## 2020-04-15 NOTE — Op Note (Signed)
[facility] Patient Name: Crystal Brennan Procedure Date: 04/15/2020 MRN: [id number] Attending MD: Lear Ng , MD Date of Birth: 20-Nov-1965 CSN: [id number] Age: 55 Admit Type: Inpatient Procedure:                Colonoscopy Indications:              This is the patient's first colonoscopy, Rectal                            bleeding Providers:                Lear Ng, MD, Cleda Daub, RN, Benetta Spar, Technician Referring MD:             Edwyna Shell. Jacelyn Grip, MD Medicines:                Propofol per Anesthesia, Monitored Anesthesia Care Complications:            No immediate complications. Estimated Blood Loss:     Estimated blood loss was minimal. Procedure:                Pre-Anesthesia Assessment:                           - Prior to the procedure, a History and Physical                            was performed, and patient medications and                            allergies were reviewed. The patient's tolerance of                            previous anesthesia was also reviewed. The risks                            and benefits of the procedure and the sedation                            options and risks were discussed with the patient.                            All questions were answered, and informed consent                            was obtained. Prior Anticoagulants: The patient has                            taken Xarelto (rivaroxaban), last dose was 2 days                            prior to procedure. ASA Grade Assessment: III - A  patient with severe systemic disease. After                            reviewing the risks and benefits, the patient was                            deemed in satisfactory condition to undergo the                            procedure.                           After obtaining informed consent, the colonoscope                            was passed under  direct vision. Throughout the                            procedure, the patient's blood pressure, pulse, and                            oxygen saturations were monitored continuously. The                            PCF-H190DL ([id number] Olympus pediatric colonscope                            was introduced through the anus and advanced to the                            the cecum, identified by appendiceal orifice and                            ileocecal valve. The colonoscopy was somewhat                            difficult due to significant looping. Successful                            completion of the procedure was aided by                            straightening and shortening the scope to obtain                            bowel loop reduction. The patient tolerated the                            procedure well. The quality of the bowel                            preparation was fair and fair but repeated  irrigation led to a good and adequate prep. The                            ileocecal valve, appendiceal orifice, and rectum                            were photographed. Scope In: 1:09:46 PM Scope Out: 1:23:41 PM Scope Withdrawal Time: 0 hours 9 minutes 30 seconds  Total Procedure Duration: 0 hours 13 minutes 55 seconds  Findings:      The perianal and digital rectal examinations were normal.      Multiple small and large-mouthed diverticula were found in the entire       colon.      Five flat polyps were found in the distal rectum. The polyps were 1 to 3       mm in size. These polyps were removed with a cold biopsy forceps.       Resection and retrieval were complete. Estimated blood loss was minimal.      Internal hemorrhoids were found during retroflexion. The hemorrhoids       were small and Grade I (internal hemorrhoids that do not prolapse). Impression:               - Preparation of the colon was fair.                           - Diverticulosis  in the entire examined colon.                           - Five 1 to 3 mm polyps in the distal rectum,                            removed with a cold biopsy forceps. Resected and                            retrieved.                           - Internal hemorrhoids. Moderate Sedation:      Not Applicable - Patient had care per Anesthesia. Recommendation:           - Await pathology results.                           - Patient has a contact number available for                            emergencies. The signs and symptoms of potential                            delayed complications were discussed with the                            patient. Return to normal activities tomorrow.                            Written discharge instructions were provided  to the                            patient.                           - High fiber diet.                           - Resume Xarelto (rivaroxaban) at prior dose                            tomorrow.                           - Repeat colonoscopy for surveillance based on                            pathology results. Procedure Code(s):        --- Professional ---                           [phone], Colonoscopy, flexible; with biopsy, single                            or multiple Diagnosis Code(s):        --- Professional ---                           K62.5, Hemorrhage of anus and rectum                           K62.1, Rectal polyp                           K64.0, First degree hemorrhoids                           K57.30, Diverticulosis of large intestine without                            perforation or abscess without bleeding CPT copyright 2019 [employer]. All rights reserved. The codes documented in this report are preliminary and upon coder review may  be revised to meet current compliance requirements. Lear Ng, MD 04/15/2020 1:33:25 PM This report has been signed electronically. Number of Addenda: 0

## 2020-04-16 ENCOUNTER — Encounter (HOSPITAL_COMMUNITY): Payer: Self-pay | Admitting: Gastroenterology

## 2020-04-16 LAB — SURGICAL PATHOLOGY

## 2020-10-25 NOTE — Progress Notes (Signed)
HPI: FU atrial fibrillation; admitted 11/15 with new onset atrial fibrillation; TSH normal. Lexiscan cardiolite was completed and negative for ischemia; EF 78.  Echocardiogram revealed an EF of 65-70% with normal wall motion. Mild concentric hypertrophy. G1DD. Peak PA pressure of 47mHg. Converted with cardizem. Since last seen, she has some dyspnea on exertion.  No orthopnea, PND, pedal edema, exertional chest pain or syncope.  She does feel palpitations at times when she has anxiety issues.  Current Outpatient Medications  Medication Sig Dispense Refill   diltiazem (CARDIZEM CD) 180 MG 24 hr capsule Take 1 capsule (180 mg total) by mouth daily. 90 capsule 3   metoprolol tartrate (LOPRESSOR) 50 MG tablet Take 1 tablet (50 mg total) by mouth 2 (two) times daily. 180 tablet 3   ondansetron (ZOFRAN ODT) 8 MG disintegrating tablet '8mg'$  ODT q4 hours prn nausea (Patient taking differently: Take 8 mg by mouth every 8 (eight) hours as needed for nausea or vomiting.) 6 tablet 0   VALERIAN ROOT PO Take 1 tablet by mouth daily as needed (Anxiety).     XARELTO 20 MG TABS tablet SMARTSIG:1 Tablet(s) By Mouth Every Evening (Patient taking differently: Take 20 mg by mouth daily.) 90 tablet 3   No current facility-administered medications for this visit.     Past Medical History:  Diagnosis Date   Anemia    Anxiety    Arthritis    shoulders    Diastolic dysfunction    a. 12/2013 Echo: EF 65-70%, no rwma, Gr1 DD, PASP 443mg.   Diverticulitis 2015   Family history of adverse reaction to anesthesia    mother wakes up during surgery    H/O cardiovascular stress test    a. 12/2013 MV: no ischemia/infarct, prominent breast attenuation.   Hypertension    PAF ([place]oxysmal atrial fibrillation) ([facility]   a. 12/2013 PAF->TSH nl->converted with Dilt;  b. CHA2DS2VASc = 2-->Eliquis.   Sickle cell trait ([facility]   Sleep apnea    cpap - does not know settings     Past Surgical History:  Procedure  Laterality Date   BIOPSY  04/15/2020   Procedure: BIOPSY;  Surgeon: ScWilford CornerMD;  Location: [facility];  Service: [place]oscopy;;   [facility]   CHOLECYSTECTOMY  2009   COLON RESECTION N/A 06/29/2015   Procedure:  LAPAROSCOPIC ASSISTED SIGMOID COLECTOMY WITH MOBILIZATION OF SPLENIC  FLEXURE;  Surgeon: DaAlphonsa OverallMD;  Location: [facility] ORS;  Service: General;  Laterality: N/A;   COLONOSCOPY WITH PROPOFOL N/A 04/15/2020   Procedure: COLONOSCOPY WITH PROPOFOL;  Surgeon: ScWilford CornerMD;  Location: [facility];  Service: Endoscopy;  Laterality: N/A;   cyst removed from right ovary      age 55  TUBAL LIGATION  192  Social History   Socioeconomic History   Marital status: Married    Spouse name: Not on file   Number of children: Not on file   Years of education: Not on file   Highest education level: Not on file  Occupational History   Not on file  Tobacco Use   Smoking status: Former    Packs/day: 0.33    Years: 30.00    Pack years: 9.90    Types: Cigarettes    Quit date: 01/01/2015    Years since quitting: 5.8   Smokeless tobacco: Never  Vaping Use   Vaping Use: Never used  Substance and Sexual Activity   Alcohol use: No  Alcohol/week: 0.0 standard drinks   Drug use: No   Sexual activity: Not on file  Other Topics Concern   Not on file  Social History Narrative   Lives in [place] with fiance.  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  Walks regularly.  Trying to lose weight.   Social Determinants of Health   Financial Resource Strain: Not on file  Food Insecurity: Not on file  Transportation Needs: Not on file  Physical Activity: Not on file  Stress: Not on file  Social Connections: Not on file  Intimate Partner Violence: Not on file    Family History  Problem Relation Age of Onset   Heart attack Mother        ? in her 61's - never sought treatment   Pulmonary embolism Mother    Other Father        ? hx   Lupus  Sister    Heart failure Sister     ROS: no fevers or chills, productive cough, hemoptysis, dysphasia, odynophagia, melena, hematochezia, dysuria, hematuria, rash, seizure activity, orthopnea, PND, pedal edema, claudication. Remaining systems are negative.  Physical Exam: Well-developed well-nourished in no acute distress.  Skin is warm and dry.  HEENT is normal.  Neck is supple.  Chest is clear to auscultation with normal expansion.  Cardiovascular exam is regular rate and rhythm.  Abdominal exam nontender or distended. No masses palpated. Extremities show no edema. neuro grossly intact  ECG-normal sinus rhythm at a rate of 71, nonspecific ST changes.  Personally reviewed  A/P  1 paroxysmal atrial fibrillation-patient remains in sinus rhythm.  Continue Xarelto at present dose.  Continue beta-blocker and Cardizem for rate control if atrial fibrillation recurs.  Check hemoglobin and renal function.  2 hypertension-patient's blood pressure is controlled.  Continue present medications.  3 history of atypical chest pain-no recent symptoms  4 morbid obesity-we discussed importance of weight loss.  Kirk Ruths, MD

## 2020-10-26 ENCOUNTER — Other Ambulatory Visit: Payer: Self-pay

## 2020-10-26 ENCOUNTER — Encounter: Payer: Self-pay | Admitting: Cardiology

## 2020-10-26 ENCOUNTER — Ambulatory Visit: Payer: 59 | Admitting: Cardiology

## 2020-10-26 VITALS — BP 138/82 | HR 71 | Ht 62.0 in | Wt 281.2 lb

## 2020-10-26 DIAGNOSIS — I480 Paroxysmal atrial fibrillation: Secondary | ICD-10-CM | POA: Diagnosis not present

## 2020-10-26 DIAGNOSIS — R0789 Other chest pain: Secondary | ICD-10-CM | POA: Diagnosis not present

## 2020-10-26 DIAGNOSIS — I10 Essential (primary) hypertension: Secondary | ICD-10-CM

## 2020-10-26 LAB — BASIC METABOLIC PANEL
BUN/Creatinine Ratio: 15 (ref 9–23)
BUN: 12 mg/dL (ref 6–24)
CO2: 22 mmol/L (ref 20–29)
Calcium: 9.3 mg/dL (ref 8.7–10.2)
Chloride: 103 mmol/L (ref 96–106)
Creatinine, Ser: 0.81 mg/dL (ref 0.57–1.00)
Glucose: 115 mg/dL — ABNORMAL HIGH (ref 65–99)
Potassium: 4.1 mmol/L (ref 3.5–5.2)
Sodium: 139 mmol/L (ref 134–144)
eGFR: 85 mL/min/{1.73_m2} (ref >59)

## 2020-10-26 LAB — CBC
Hematocrit: 43 % (ref 34.0–46.6)
Hemoglobin: 14.3 g/dL (ref 11.1–15.9)
MCH: 28.8 pg (ref 26.6–33.0)
MCHC: 33.3 g/dL (ref 31.5–35.7)
MCV: 87 fL (ref 79–97)
Platelets: 285 10*3/uL (ref 150–450)
RBC: 4.97 x10E6/uL (ref 3.77–5.28)
RDW: 13.8 % (ref 11.7–15.4)
WBC: 7.1 10*3/uL (ref 3.4–10.8)

## 2020-10-26 MED ORDER — XARELTO 20 MG PO TABS: ORAL_TABLET | ORAL | 3 refills | Status: DC

## 2020-10-26 MED ORDER — DILTIAZEM HCL ER COATED BEADS 180 MG PO CP24: 180.0000 mg | ORAL_CAPSULE | Freq: Every day | ORAL | 3 refills | Status: DC

## 2020-10-26 MED ORDER — METOPROLOL TARTRATE 50 MG PO TABS: 50.0000 mg | ORAL_TABLET | Freq: Two times a day (BID) | ORAL | 3 refills | Status: DC

## 2020-10-26 NOTE — Patient Instructions (Signed)
Medication Instructions:  The current medical regimen is effective;  continue present plan and medications.  *If you need a refill on your cardiac medications before your next appointment, please call your pharmacy*   Lab Work: CBC, BMET today   If you have labs (blood work) drawn today and your tests are completely normal, you will receive your results only by: [place] (if you have MyChart) OR A paper copy in the mail If you have any lab test that is abnormal or we need to change your treatment, we will call you to review the results.   Follow-Up: At [facility], you and your health needs are our priority.  As part of our continuing mission to provide you with exceptional heart care, we have created designated Provider Care Teams.  These Care Teams include your primary Cardiologist (physician) and Advanced Practice Providers (APPs -  Physician Assistants and Nurse Practitioners) who all work together to provide you with the care you need, when you need it.  We recommend signing up for the patient portal called "MyChart".  Sign up information is provided on this After Visit Summary.  MyChart is used to connect with patients for Virtual Visits (Telemedicine).  Patients are able to view lab/test results, encounter notes, upcoming appointments, etc.  Non-urgent messages can be sent to your provider as well.   To learn more about what you can do with MyChart, go to [email or web address].    Your next appointment:   As needed  The format for your next appointment:   In Person  Provider:   Kirk Ruths, MD

## 2020-10-27 ENCOUNTER — Telehealth: Payer: Self-pay | Admitting: Cardiology

## 2020-10-27 NOTE — Telephone Encounter (Signed)
See my chart message

## 2020-10-27 NOTE — Telephone Encounter (Signed)
New Message:       Patient says her lab results showed that her glucose was high. Does that mean she have diabetes and is this something  that she needs to be concerned about? at

## 2021-10-31 ENCOUNTER — Other Ambulatory Visit: Payer: Self-pay | Admitting: Cardiology

## 2021-10-31 DIAGNOSIS — I10 Essential (primary) hypertension: Secondary | ICD-10-CM

## 2021-11-28 ENCOUNTER — Other Ambulatory Visit: Payer: Self-pay | Admitting: Cardiology

## 2021-11-29 NOTE — Telephone Encounter (Signed)
Prescription refill request for Xarelto received.  Indication:Afib Last office visit:needs appt Weight:127.6 kg Age:56 Scr:0.8 CrCl:156.29  ml/min  Prescription refilled

## 2022-01-06 ENCOUNTER — Other Ambulatory Visit: Payer: Self-pay | Admitting: Cardiology

## 2022-01-06 DIAGNOSIS — I10 Essential (primary) hypertension: Secondary | ICD-10-CM

## 2022-02-05 ENCOUNTER — Other Ambulatory Visit: Payer: Self-pay | Admitting: Cardiology

## 2022-02-05 DIAGNOSIS — I10 Essential (primary) hypertension: Secondary | ICD-10-CM

## 2023-02-26 ENCOUNTER — Other Ambulatory Visit: Payer: Self-pay | Admitting: Cardiology

## 2023-02-26 DIAGNOSIS — I10 Essential (primary) hypertension: Secondary | ICD-10-CM

## 2023-10-24 ENCOUNTER — Telehealth: Payer: Self-pay | Admitting: Cardiology

## 2023-10-24 NOTE — Telephone Encounter (Signed)
 Office in [place]  is requesting a callback/confirmation call at [phone] once most recent office notes from last visit has been faxed over to them at [phone]. Pt hasn't been seen since 10/2020 so she'll be considered a new patient after 10/27/2023. Please advise.

## 2023-10-26 ENCOUNTER — Encounter: Payer: Self-pay | Admitting: *Deleted

## 2023-10-26 NOTE — Telephone Encounter (Signed)
 Spoke with office in [place] , the patient has moved there and they are asking for the last office note. Faxed to the number provided.
